# Patient Record
Sex: Female | Born: 1935
Health system: Southern US, Community
[De-identification: ages and names within clinical notes are randomized; demographics above are authoritative.]

## PROBLEM LIST (undated history)

## (undated) DIAGNOSIS — R2681 Unsteadiness on feet: Secondary | ICD-10-CM

## (undated) DIAGNOSIS — I340 Nonrheumatic mitral (valve) insufficiency: Secondary | ICD-10-CM

## (undated) DIAGNOSIS — R41841 Cognitive communication deficit: Secondary | ICD-10-CM

## (undated) DIAGNOSIS — F0280 Dementia in other diseases classified elsewhere without behavioral disturbance: Secondary | ICD-10-CM

## (undated) DIAGNOSIS — I10 Essential (primary) hypertension: Secondary | ICD-10-CM

## (undated) DIAGNOSIS — R4701 Aphasia: Secondary | ICD-10-CM

## (undated) DIAGNOSIS — T7840XA Allergy, unspecified, initial encounter: Secondary | ICD-10-CM

## (undated) DIAGNOSIS — K219 Gastro-esophageal reflux disease without esophagitis: Secondary | ICD-10-CM

## (undated) DIAGNOSIS — D126 Benign neoplasm of colon, unspecified: Secondary | ICD-10-CM

## (undated) DIAGNOSIS — F329 Major depressive disorder, single episode, unspecified: Secondary | ICD-10-CM

## (undated) DIAGNOSIS — F419 Anxiety disorder, unspecified: Secondary | ICD-10-CM

## (undated) DIAGNOSIS — C159 Malignant neoplasm of esophagus, unspecified: Secondary | ICD-10-CM

## (undated) DIAGNOSIS — R261 Paralytic gait: Secondary | ICD-10-CM

## (undated) DIAGNOSIS — K5790 Diverticulosis of intestine, part unspecified, without perforation or abscess without bleeding: Secondary | ICD-10-CM

## (undated) DIAGNOSIS — F0390 Unspecified dementia without behavioral disturbance: Secondary | ICD-10-CM

## (undated) DIAGNOSIS — E785 Hyperlipidemia, unspecified: Secondary | ICD-10-CM

## (undated) DIAGNOSIS — J449 Chronic obstructive pulmonary disease, unspecified: Secondary | ICD-10-CM

## (undated) DIAGNOSIS — M810 Age-related osteoporosis without current pathological fracture: Secondary | ICD-10-CM

## (undated) DIAGNOSIS — R1310 Dysphagia, unspecified: Secondary | ICD-10-CM

## (undated) DIAGNOSIS — R232 Flushing: Secondary | ICD-10-CM

## (undated) HISTORY — DX: Essential (primary) hypertension: I10

## (undated) HISTORY — DX: Hyperlipidemia, unspecified: E78.5

## (undated) HISTORY — PX: ABDOMINAL HYSTERECTOMY: SHX81

## (undated) HISTORY — PX: ANTERIOR AND POSTERIOR VAGINAL REPAIR W/ SACROSPINOUS LIGAMENT SUSPENSION: SUR6

## (undated) HISTORY — DX: Benign neoplasm of colon, unspecified: D12.6

## (undated) HISTORY — PX: TONSILLECTOMY: SUR1361

## (undated) HISTORY — DX: Diverticulosis of intestine, part unspecified, without perforation or abscess without bleeding: K57.90

## (undated) HISTORY — PX: TUBAL LIGATION: SHX77

## (undated) HISTORY — DX: Anxiety disorder, unspecified: F41.9

## (undated) HISTORY — PX: OTHER SURGICAL HISTORY: SHX169

---

## 1983-06-13 HISTORY — PX: EXPLORATORY LAPAROTOMY: SUR591

## 1993-12-10 DIAGNOSIS — D126 Benign neoplasm of colon, unspecified: Secondary | ICD-10-CM

## 1993-12-10 HISTORY — DX: Benign neoplasm of colon, unspecified: D12.6

## 2000-04-20 ENCOUNTER — Encounter (INDEPENDENT_AMBULATORY_CARE_PROVIDER_SITE_OTHER): Payer: Self-pay | Admitting: Specialist

## 2000-04-20 ENCOUNTER — Other Ambulatory Visit: Admission: RE | Admit: 2000-04-20 | Discharge: 2000-04-20 | Payer: Self-pay | Admitting: Gastroenterology

## 2000-11-05 ENCOUNTER — Ambulatory Visit (HOSPITAL_COMMUNITY): Admission: RE | Admit: 2000-11-05 | Discharge: 2000-11-05 | Payer: Self-pay | Admitting: Pulmonary Disease

## 2000-12-17 ENCOUNTER — Encounter: Payer: Self-pay | Admitting: Gynecology

## 2000-12-20 ENCOUNTER — Inpatient Hospital Stay (HOSPITAL_COMMUNITY): Admission: RE | Admit: 2000-12-20 | Discharge: 2000-12-21 | Payer: Self-pay | Admitting: Gynecology

## 2001-07-23 ENCOUNTER — Inpatient Hospital Stay (HOSPITAL_COMMUNITY): Admission: RE | Admit: 2001-07-23 | Discharge: 2001-07-24 | Payer: Self-pay | Admitting: Gynecology

## 2001-11-06 ENCOUNTER — Ambulatory Visit (HOSPITAL_COMMUNITY): Admission: RE | Admit: 2001-11-06 | Discharge: 2001-11-06 | Payer: Self-pay | Admitting: Pulmonary Disease

## 2002-11-07 ENCOUNTER — Ambulatory Visit (HOSPITAL_COMMUNITY): Admission: RE | Admit: 2002-11-07 | Discharge: 2002-11-07 | Payer: Self-pay | Admitting: Pulmonary Disease

## 2002-11-13 ENCOUNTER — Ambulatory Visit (HOSPITAL_COMMUNITY): Admission: RE | Admit: 2002-11-13 | Discharge: 2002-11-13 | Payer: Self-pay | Admitting: Pulmonary Disease

## 2003-11-19 ENCOUNTER — Ambulatory Visit (HOSPITAL_COMMUNITY): Admission: RE | Admit: 2003-11-19 | Discharge: 2003-11-19 | Payer: Self-pay | Admitting: Pulmonary Disease

## 2004-01-05 ENCOUNTER — Other Ambulatory Visit: Admission: RE | Admit: 2004-01-05 | Discharge: 2004-01-05 | Payer: Self-pay | Admitting: Gynecology

## 2004-11-22 ENCOUNTER — Ambulatory Visit (HOSPITAL_COMMUNITY): Admission: RE | Admit: 2004-11-22 | Discharge: 2004-11-22 | Payer: Self-pay | Admitting: Gynecology

## 2005-11-29 ENCOUNTER — Ambulatory Visit (HOSPITAL_COMMUNITY): Admission: RE | Admit: 2005-11-29 | Discharge: 2005-11-29 | Payer: Self-pay | Admitting: Gynecology

## 2006-03-07 ENCOUNTER — Other Ambulatory Visit: Admission: RE | Admit: 2006-03-07 | Discharge: 2006-03-07 | Payer: Self-pay | Admitting: Gynecology

## 2006-06-12 HISTORY — PX: COLONOSCOPY: SHX174

## 2006-12-03 ENCOUNTER — Ambulatory Visit (HOSPITAL_COMMUNITY): Admission: RE | Admit: 2006-12-03 | Discharge: 2006-12-03 | Payer: Self-pay | Admitting: Internal Medicine

## 2007-02-08 ENCOUNTER — Ambulatory Visit: Payer: Self-pay | Admitting: Gastroenterology

## 2007-02-22 ENCOUNTER — Ambulatory Visit: Payer: Self-pay | Admitting: Gastroenterology

## 2007-12-05 ENCOUNTER — Ambulatory Visit (HOSPITAL_COMMUNITY): Admission: RE | Admit: 2007-12-05 | Discharge: 2007-12-05 | Payer: Self-pay | Admitting: Gynecology

## 2008-01-27 ENCOUNTER — Ambulatory Visit: Payer: Self-pay | Admitting: Cardiovascular Disease

## 2008-01-27 ENCOUNTER — Inpatient Hospital Stay (HOSPITAL_COMMUNITY): Admission: RE | Admit: 2008-01-27 | Discharge: 2008-01-30 | Payer: Self-pay | Admitting: Orthopedic Surgery

## 2008-01-29 ENCOUNTER — Encounter (INDEPENDENT_AMBULATORY_CARE_PROVIDER_SITE_OTHER): Payer: Self-pay | Admitting: Orthopedic Surgery

## 2008-01-29 ENCOUNTER — Ambulatory Visit: Payer: Self-pay | Admitting: Vascular Surgery

## 2008-05-13 HISTORY — PX: OTHER SURGICAL HISTORY: SHX169

## 2008-06-01 ENCOUNTER — Ambulatory Visit (HOSPITAL_BASED_OUTPATIENT_CLINIC_OR_DEPARTMENT_OTHER): Admission: RE | Admit: 2008-06-01 | Discharge: 2008-06-01 | Payer: Self-pay | Admitting: Orthopedic Surgery

## 2010-09-06 ENCOUNTER — Other Ambulatory Visit: Payer: Self-pay | Admitting: Gynecology

## 2010-10-25 NOTE — Op Note (Signed)
NAMEJADAN, Jamie [name]             ACCOUNT NO.:  [id number]   MEDICAL RECORD NO.:  [id number]          PATIENT TYPE:  INP   LOCATION:  5025                         FACILITY:  [facility]   PHYSICIAN:  Robert A. Thurston Hole, M.D. DATE OF BIRTH:  1929/02/24   DATE OF PROCEDURE:  01/28/2008  DATE OF DISCHARGE:                               OPERATIVE REPORT   PREOPERATIVE DIAGNOSIS:  Left elbow olecranon fracture.   POSTOPERATIVE DIAGNOSIS:  Left elbow olecranon fracture.   PROCEDURE:  Open reduction and internal fixation of left elbow olecranon  fracture.   SURGEON:  Elana Alm. Thurston Hole, MD   ASSISTANT:  Julien Girt, PA   ANESTHESIA:  General.   OPERATIVE TIME:  1 hour.   COMPLICATIONS:  None.   DESCRIPTION OF PROCEDURE:  [name] was brought to the operating room  on January 28, 2008, after an axillary block was placed in holding by  anesthesia for postoperative pain control.  She was placed on the  operating table in supine position.  She received Ancef 1 g IV  preoperatively for prophylaxis.  After being placed under general  anesthesia, her left arm was prepped using sterile DuraPrep and draped  using sterile technique.  Arm was exsanguinated and a truncate elevated  to 150 mmHg.  Initially, 8 cm longitudinal incision based over the  olecranon and an initial exposure was made.  Underlying subcutaneous  tissues were incised along with skin incision.  The periosteum incised  over the fracture.  The fracture was easily identified and found to be a  large olecranon fragment.  Hematoma was cleaned from the fracture site  and thoroughly irrigated.  The articular surfaces were found to be  intact except for at the fracture site.  At this point, then a towel  clip was used to reduce the fracture and then a guide pin from the  cannulated 7.2-mm screw set was drilled from the tip of the olecranon  across the fracture site and into the ulna shaft under fluoroscopic  control.  This held  the fracture reduced and then it was measured, found  to be 90 mm in length and then overdrilled with a 5.0-mm drill and then  a 90 mm x 7.2 mm screw was screwed over the guide pin.  Prior to  tightening this, however, a figure-of-eight 18-gauge wire was placed  making 2 small drill holes in the proximal ulna approximately 3 cm  distal to the fracture site and then the figure-of-eight wire was passed  around the tip of the screw and then tightened down and then the screw  was tightened down as well.  After this was done, then intraoperative  fluoroscopy confirmed anatomic reduction of the fracture in satisfactory  position of the hardware.  The extra wire was then cut and the wire was  buried in the lateral soft tissue away from the ulnar nerve.  The elbow,  we brought the range of motion from 0-90 degrees with no displacement of  the fracture.  The wound was thoroughly irrigated.  It was felt at this  point that the all the pathology been  satisfactorily addressed.  The  periosteum and fascia was enclosed over the hardware and the fracture  site with interrupted 2-0 Vicryl suture, subcutaneous  tissue is closed with 2-0 Vicryl, skin closed with skin staples.  Sterile dressings were applied and the long-arm splint applied with the  elbow in 45 degrees of flexion.  Tourniquet was then released.  She was  then awakened, extubated, and taken to recovery room in stable  condition.  Needle and sponge counts were correct x2 at the end of the  case.      Robert A. Thurston Hole, M.D.  Electronically Signed     RAW/MEDQ  D:  01/28/2008  T:  01/29/2008  Job:  [id number]

## 2010-10-25 NOTE — Op Note (Signed)
[name], [name]             ACCOUNT NO.:  [id number]   MEDICAL RECORD NO.:  [id number]          PATIENT TYPE:  AMB   LOCATION:  DSC                          FACILITY:  [facility]   PHYSICIAN:  Robert A. Thurston Hole, M.D. DATE OF BIRTH:  [birth year]   DATE OF PROCEDURE:  06/01/2008  DATE OF DISCHARGE:                               OPERATIVE REPORT   PREOPERATIVE DIAGNOSIS:  Left elbow arthrofibrosis status post open  reduction and internal fixation olecranon fracture.   POSTOPERATIVE DIAGNOSIS:  Left elbow arthrofibrosis status post open  reduction and internal fixation olecranon fracture.   PROCEDURE:  1. His left elbow examination under anesthesia followed by      manipulation under anesthesia.  2. Left elbow intraarticular cortisone injection.   SURGEON:  Elana Alm. Thurston Hole, M.D.   ANESTHESIA:  General.   OPERATIVE TIME:  10 minutes.   COMPLICATIONS:  None.   INDICATIONS FOR PROCEDURE:  [name] is a 77 year old woman who is 4  months status post left elbow ORIF of her olecranon fracture.  The  fracture has healed well, but she has had significant post fracture  arthrofibrosis with failed conservative care and is now to undergo EUA  manipulation and injection.   DESCRIPTION:  [name] was brought to the operating room on June 01, 2008, placed on operative table on supine position.  After adequate  level of general anesthesia was obtained, her left elbow was examined.  Initial range of motion was from -20 to 70 degrees.  This elbow was  sterilely injected with 0.25% Marcaine with epinephrine and 40 mg of  Depo-Medrol.  A gentle manipulation was carried down and flexion  breaking up adhesions and improving flexion of 130 degrees, extension to  -15, and the elbow remains stable.  Intraoperative fluoroscopy confirmed  persistent anatomic position of the fracture with the fracture being  healed as well as anatomic position of the elbow joint, no abnormalities  noted.  At  this point, she awakened and taken to the recovery room in  stable condition.   FOLLOWUP CARE:  [name] will be followed as an outpatient on Darvocet  for pian with early aggressive physical therapy.  She will be seen back  in the office in a week for recheck and followup.     Robert A. Thurston Hole, M.D.  Electronically Signed    RAW/MEDQ  D:  06/01/2008  T:  06/02/2008  Job:  [id number]

## 2010-10-25 NOTE — Consult Note (Signed)
Jamie [name], Jamie [name]             ACCOUNT NO.:  [id number]   MEDICAL RECORD NO.:  [id number]          PATIENT TYPE:  INP   LOCATION:  5025                         FACILITY:  [facility]   PHYSICIAN:  Noralyn Pick. Eden Emms, MD, FACCDATE OF BIRTH:  [birth year]   DATE OF CONSULTATION:  DATE OF DISCHARGE:                                 CONSULTATION   Jamie [name] is a 77 year old patient admitted to the hospital yesterday  after a fall.  She had had her eyes dilated to have a ophthalmologic  exam.  She tripped on a curve.  She injured her right wrist, left ankle,  and left elbow.  She is preop for left elbow surgery.  In fact when I  saw the patient, she was already in the PACU.  We were asked to evaluate  her due to an abnormal EKG.  The patient is totally asymptomatic in  regards to her heart.  She has never had palpitations, PND, or  orthopnea.  She has mild exertional dyspnea.  She is a smoker and  apparently has some COPD.  The patient's EKG was reviewed.  She has  nonsignificant ST-T wave changes.  There is no evidence of a previous  infarct and no acute changes that I can tell.   The patient does have a history of right carotid bruit.  She tells me  she had a Duplex less than 2 years ago and did not have a bad stenosis.  It has not been followed up.   PAST MEDICAL HISTORY:  Otherwise, remarkable for colonic polyps,  hypertension, anxiety, hypercholesterolemia, and question mitral valve  prolapse.   ALLERGIES:  She is allergic to CODEINE.   CURRENT MEDICATIONS:  She is on aspirin, Xanax, Zetia 10 a day,  cholecalciferol, Celexa, Lisinopril 10 a day, estradiol, morphine, and  Robaxin.   SOCIAL HISTORY:  She lives in [place].  She is widowed.  She does  activities of daily living.  She is very active.  She does not drink.  She has a 50-pack-year smoking history.   FAMILY HISTORY:  Remarkable for mother dying of CVA.  Father having an  MI.   REVIEW OF SYSTEMS:  Otherwise, negative.   PHYSICAL EXAMINATION:  GENERAL:  Remarkable for a thin white female in  no distress, although she has a splint and Ace bandages around her right  wrist, left elbow, and left ankle.  VITAL SIGNS:  Blood pressure is 130/60, pulse 61 and regular,  temperature 98.4, and sats are 95% on room air.  HEENT:  Unremarkable.  NECK:  She does have a right carotid bruit.  No lymphadenopathy,  thyromegaly, or JVP elevation.  LUNGS:  Clear.  Good diaphragmatic motion.  No wheezing.  HEART:  S1 and S2 with a soft systolic murmur.  PMI normal.  ABDOMEN:  Benign.  Bowel sounds positive.  No AAA.  No tenderness.  No  bruits.  No hepatosplenomegaly.  No hepatojugular reflux.  EXTREMITIES:  Distal pulses are intact.  No edema.  NEUROLOGIC:  Nonfocal.  SKIN:  Warm and dry.  MUSCULOSKELETAL:  No muscular weakness.   EKG was  as indicated with sinus rhythm, nonsignificant ST-T wave  changes.   LABORATORY DATA:  Lab work is remarkable for hematocrit of 36.8 and  creatinine is 0.7.  Point-of-care enzymes are negative.   IMPRESSION:  1. The patient is not having chest pain.  She has no history of      coronary artery disease.  She is undergoing relatively low risk      surgery.  She is clear to have her surgery.  2. Right carotid bruit.  She needs a follow up duplex.  We would      maintain on aspirin therapy.  3. Systolic murmur, very well could have some mitral valve prolapse.      There is no cardiac enlargement on her chest x-ray.  She has not      had heart failure.  Follow up echo is in order.  4. Smoking.  The patient seems to be motivated to quit at this point.      She should probably be discharged on Wellbutrin.   I explained the fact that the surgery carries some risk in regards to  her general anesthesia and her previous smoking.  However, I do not  think it needs to be delayed in regards to any higher risk for ischemia  or coronary artery disease.      Noralyn Pick. Eden Emms, MD, [facility]   Electronically Signed     PCN/MEDQ  D:  01/28/2008  T:  01/29/2008  Job:  [id number]

## 2010-10-28 NOTE — Discharge Summary (Signed)
[facility]  Patient:    Jamie [name], Jamie [name] Visit Number: [id number] MRN: [id number]          Service Type: Attending:  Gretta Cool, M.D. Dictated by:   Jeani Sow, F.N.P. Adm. Date:  07/23/01 Disc. Date: 07/24/01                             Discharge Summary  HISTORY OF PRESENT ILLNESS:  Jamie [name] is a 77 year old gravida 3, para 3, with a history of total vaginal vault prolapse, cystocele, enterocele, rectocele, which were repaired in July 2002.  She had almost immediate failure with the anterior enterocele development and recurrent pelvic organ prolapse which was progressive.  She developed a weak area in the anterior vaginal wall near the cuff closure that is felt to be an anterior enterocele.  She is now admitted for repeat management.  ADMISSION EXAM:  CHEST:  Clear to A&P.  HEART:  Rate and rhythm were regular without murmur, gallop, or cardiac enlargement.  ABDOMEN:  Soft and scaphoid without masses or organomegaly.  PELVIC:  External genitalia within normal limits for female vagina.  There is an anterior enterocele that bulges and protrudes from the anterior to the vaginal cuff closure.  The cuff is reasonably well supported.  Posterior support is adequate.  Retrovaginal exam confirms.  IMPRESSION: 1. Recurrent pelvic organ prolapse secondary to anterior enterocele. 2. Urgency incontinence that is persistent.  PLAN:  All options have been discussed with the patient.  She has previously had partial colpocleisis.  Recommendation now is complete colpocleisis with repair of the anterior vaginal wall enterocele under general anesthesia. Risks and benefits have been discussed with the patient and she accepts these procedures.  Admission hemoglobin 14.6, hematocrit 43.2.  On the first postoperative day, hemoglobin was 11.8, hematocrit 34.5.  HOSPITAL COURSE:  Patient underwent repair of the pelvic organ prolapse under general anesthesia.   The procedure was completed without any complications and the patient was returned to the recovery room in excellent condition.  Her postoperative course was without complication and she was discharged on the first postoperative day in excellent condition.  Final discharge instructions included no heavy lifting or straining, no vaginal entrance, and increase ambulation as tolerated.  She is to call for any fever of over 100.5 or failure of daily improvement.  Diet regular.  MEDICATIONS: 1. Citrucel two times a day. 2. Tylox one p.o. q.4h. p.r.n. pain. 3. Vioxx 25 mg daily.  FOLLOW-UP:  She is to return to the office in one week for follow-up.  CONDITION ON DISCHARGE:  Excellent.  FINAL DISCHARGE DIAGNOSIS:  Anterior enterocele with recurrent pelvic prolapse.  PROCEDURES PERFORMED: 1. Repair of pelvic organ prolapse with anterior enterocele. 2. General anesthesia. Dictated by:   Jeani Sow, F.N.P. Attending:  Gretta Cool, M.D. DD:  08/19/01 TD:  08/20/01 Job: [id number]

## 2010-10-28 NOTE — H&P (Signed)
[facility]  Patient:    Jamie [name], Jamie [name] Visit Number: [id number] MRN: [id number]          Service Type: GYN Location: 1S X002 01 Attending Physician:  Katrina Stack Dictated by:   Gretta Cool, M.D. Admit Date:  07/23/2001                           History and Physical  PREOPERATIVE DIAGNOSIS:  Anterior enterocele with recurrent pelvic prolapse.  POSTOPERATIVE DIAGNOSIS:  Anterior enterocele with recurrent pelvic prolapse.  PROCEDURES:  Repair of pelvic organ prolapse with anterior enterocele.  SURGEON:  Gretta Cool, M.D.  ASSISTANT:  Raynald Kemp, M.D.  ANESTHESIA:  General orotracheal.  DESCRIPTION OF PROCEDURE:  Under excellent general anesthesia with the patients abdomen prepped and draped in the lithotomy position in [place] stirrups with a Foley catheter draining her bladder, an incision was made at the apex of the vaginal cuff.  The patient had previously had a partial colpocleisis.  She had a very large anterior enterocele bulging from the apex of the vaginal cuff just anterior to the cuff closure.  At this point, the anterior vaginal wall mucosa was incised centrally and the mucosa dissected from the endocervical fascia.  Once the fascia was adequately freed so as to release the bladder and the enterocele, a reduction of the enterocele was undertaken with plication of the enterocele sac and plication of the anterior vaginal wall with a series of pursestring sutures.  Once the bladder was adequately reduced and the anterior vaginal wall was adequately reduced, the adhesions posteriorly could be noted with some tension downward.  Those adhesions were released.  At this point, Kelly plication sutures were placed so as to elevate the vesicle neck.  The apex of the vagina was then fixed with a plication of the anterior and posterior vaginal wall fascia to the ischiorectalis muscle laterally.  One suture was placed on  each side using 0 Ethibond.  At this point, the fascia anteriorly was identified and secured as a complete envelop of fascia to the posterior fascia of the perirectal fascia.  The fascia was approximated with interrupted mattress sutures all the way across the vagina.  Next pursestring sutures of 0 Vicryl were used to plicate concentric rings of fascia so as to completely obliterate the mid portion of the vagina as well.  The fascia was approximated 360 degrees around the perimeter of the vagina.  At this point, the perineal body musculature was reapproximated through a separate posterior repair incision.  Careful attention was given to inspection of the anal sphincter and the terminal rectum.  There was no evidence of weakness or further side for pelvic support failure.  At this point, the mucosa was trimmed and the incisions were closed with a running suture of 2-0 Vicryl as a subcuticular closure, also approximating the upper layers of endopelvic fascia.  At this point, the sponge and lap counts were correct.  There were no complications.  The bladder was filled with irrigation fluid and a [place] suprapubic Cystocath placed. At this point, the procedure was terminated without complications.  The patient returned to the recovery room in excellent condition without complications. Dictated by:   Gretta Cool, M.D. Attending Physician:  Katrina Stack DD:  07/23/01 TD:  07/23/01 Job: [id number]

## 2010-10-28 NOTE — H&P (Signed)
[facility]  Patient:    Jamie [name], Jamie [name]                      MRN: [id number] Attending:  Gretta Cool, M.D. CC:         Dr. Ricki Rodriguez, [place]  Rozanna Boer., M.D.   History and Physical  CHIEF COMPLAINT:  [name] presented initially for gynecologic evaluation for severe pelvic organ prolapse referred by Dr. Aldean Ast and Dr. Juanetta Gosling in [place].  HISTORY OF PRESENT ILLNESS:  A 77 year old white, married, gravida 3, para 3 initially evaluated by Dr. Aldean Ast for cystocele. She also has some significant urgency incontinence but does not have significant stress incontinence. Because of her profound total vaginal vault prolapse and global pelvic support problems, she was referred for a GYN evaluation and consideration of pessary versus definitive surgery. On initial examination, she was noted to have ulceration of the vaginal mucosa from pressure of sitting on the prolapsed part. She initially was fitted with a pessary and tolerated the pessary well. The pessary did not cause significant stress urinary incontinence but she has difficulty tolerating the pessary. Various types of pessary were used but without finding one that was satisfactory for the patient. She is currently using a ______ pessary with center support with reduction of her pelvic organ prolapse but with continued complaints about tolerating it and discomfort with it. She now wishes to proceed to definitive therapy by anterior and posterior colporrhaphy with vault suspension and colpocleisis. She would not consider returning to sexual activity under any circumstance. She has had considerable relief in her urgency with Detrol LA and now wishes to proceed to surgical management rather than continued use of the pessary. She is now admitted for definitive therapy.  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 Medical illnesses, none of  consequence.  PAST SURGICAL HISTORY:  T&A as a child, hysterectomy in 1976 elsewhere, exploratory laparotomy in 1985, salpingo-oophorectomy in 1986 by Dr. Arelia Sneddon.  HABITS:  Denies ethanol. Smokes one pack per day.  CURRENT MEDICATIONS:  Lipitor 10 mg daily, Detrol LA 4 mg daily, Estrace vaginal cream 2-3 times weekly for improvement in vaginal thickness and tolerance of the pessary.  SOCIAL HISTORY:  Retired, gravida 3, para 3 who lives alone with a good family support system.  REVIEW OF SYSTEMS:  HEENT:  Denies symptoms. CARDIORESPIRATORY:  Denies asthma, cough, bronchitis, shortness of breath. GI/GU:  Denies frequency or urgency, dysuria, or change in bowel habits, food intolerance. Urgency incontinence as above up to 3-4 times at night.  PHYSICAL EXAMINATION:  GENERAL:  Well-developed, well-nourished, thin, white female.  HEENT:  Pupils equal and reactive to light and accommodate. Fundi not examined. Oropharynx clear.  NECK:  Supple without mass or thyroid enlargement.  CHEST:  Clear to auscultation and percussion.  BREASTS:  Soft without mass, nodes, or nipple discharge.  HEART:  Regular rhythm without murmur or cardiac enlargement.  ABDOMEN:  Soft, scaphoid without mass or organomegaly. Previous incision scar is noted.  PELVIC:  External genitalia normal female. Vagina clean and rugose. With the pessary removed, there is complete total vaginal vault prolapse even at rest extending approximately 6 cm beyond the introitus. There is enterocele, rectocele and cystocele all protruding with the apex of the vagina. She has reasonably well preserved anal sphincter tone and intubation of her perineum. Rectovaginal exam confirms cervix and uterus surgically absent and adnexa also absent.  EXTREMITIES:  Negative.  NEUROLOGIC:  Physiologic.  IMPRESSIONS:  1. Severe vaginal vault prolapse, grade 4 with cystocele, rectocele,     and enterocele all grade 4 with intolerance of  pessary.  2. Urge incontinence without significant stress component with negative     barrier test.  PLAN:  Vaginal vault suspension procedure, anterior posterior colporrhaphy and enterocele repair and colpocleisis. Note, I have discussed the alternatives, risk/benefit ratio with the patient in detail. She understands the greater risk of medical complications as a result of her smoking. She is now admitted for definitive therapy as above. DD:  12/19/00 TD:  12/20/00 Job: [id number]

## 2010-10-28 NOTE — Discharge Summary (Signed)
[facility]  Patient:    Jamie [name], Jamie [name]                      MRN: [id number] Adm. Date:  12/20/00 Disc. Date: 12/21/00 Attending:  Gretta Cool, M.D. Dictator:   Jeani Sow, F.N.P. CC:         Rozanna Boer., M.D.   Discharge Summary  HISTORY OF PRESENT ILLNESS:  Ms. Bilski is a 77 year old white married female, gravida 3, para 3, who was initially seen by Dr. Aldean Ast and referred to our office for evaluation of severe pelvic organ prolapse.  She reports significant urgency incontinence but does not seem to have stress incontinence.  Because of her total vaginal vault prolapse and global pelvic support problems, she was referred for consideration of pessary versus definitive therapy.  On initial exam, it was noted that she had ulceration of the vaginal mucosa from pressure of sitting on the prolapsed part.  She was initially fitted with a pessary and tolerated it well.  It did not cause significant stress incontinence, but she did have difficulty tolerating the pessary.  Various types were attempted without finding one that was satisfactory for the patient.  She now wishes to proceed to definitive therapy by anterior and posterior colporrhaphy with vaginal vault suspension and colpocleisis.  She also had considerable relief of the urgency with Detrol LA.  PHYSICAL EXAMINATION:  CHEST:  Clear to A&P.  HEART:  Rate and rhythm were regular without murmur, gallop, or cardiac enlargement.  ABDOMEN:  Soft and scaphoid without masses or organomegaly.  A previous incision scar is noted.  PELVIC:  External genitalia within normal limits for female.  Vagina is clean and rugose.  With the pessary removed, there is complete total vaginal vault prolapse and, even at rest, extending approximately 6 cm beyond the introitus. There is an enterocele, rectocele, and a cystocele all protruding with the apex of the vagina.  She has reasonably well  preserved anal sphincter tone. Rectovaginal exam confirms.  The uterus and cervix were surgically absent and adnexa are also absent.  IMPRESSION: 1. Severe vaginal vault prolapse, grade 4, with a cystocele, rectocele, and    enterocele, with intolerance of pessary. 2. Urge incontinence without significant stress component with negative    barrier test.  PLAN:  Vaginal vault suspension procedure, anterior and posterior colporrhaphy and enterocele repairs, and colpocleisis under general anesthesia.  Risks and benefits have been discussed with the patient and she accepts these procedures.  LABORATORY DATA:  Admission hemoglobin 14.4, hematocrit 42.6.  The remainder of her preoperative laboratory work was within normal limits.  EKG normal sinus rhythm, nonspecific T wave abnormality.  Chest x-ray negative for active disease.  Pulmonary vascularity is felt to be within normal limits.  There is also a minimal increased density noted in the left cardiophrenic area which may represent an apical fat pad.  HOSPITAL COURSE:  Patient underwent the above-named procedures under general anesthesia.  The procedure was completed without any complications and the patient was returned to the recovery room in excellent condition.  Her postoperative course was without complications and she was discharged on the first postoperative day in excellent condition.  Final discharge instructions included no heavy lifting or straining, no vaginal entrance, and increase ambulation as tolerated.  She is to call for any fever of over 100.5 or failure of daily improvement.  She had difficulty voiding that first day after surgery.  An in and out  catheterization was 500 cc; therefore, the Foley catheter was inserted for discharge.  She is to return to the office in four days for follow-up.  DIET:  Regular.  MEDICATIONS: 1. Vioxx 25 mg daily. 2. Tylox one p.o. q.2h. p.r.n. discomfort. 3. Ceftin 500 mg b.i.d. for  four days.  POSTOPERATIVE DIAGNOSES: 1. Vaginal vault prolapse, grade 4, with cystocele, rectocele, enterocele. 2. Stress urinary incontinence, genuine stress component. 3. Previous hysterectomy elsewhere.  PROCEDURES PERFORMED:  Anterior and posterior colporrhaphy and vaginal vault suspension and partial colpocleisis under general anesthesia. DD:  01/02/01 TD:  01/02/01 Job: [id number]

## 2010-10-28 NOTE — Op Note (Signed)
[facility]  Patient:    Jamie [name], Jamie [name]              MRN: [id number] Adm. Date:  [id number] Attending:  Katrina Stack CC:         Rozanna Boer., M.D.   Operative Report  PREOPERATIVE DIAGNOSES: 1. Vaginal vault prolapse, grade 4, with cystocele, enterocele, rectocele,    also grade 4. 2. Stress urinary incontinence -- genuine stress incontinence. 3. Previous hysterectomy elsewhere.  POSTOPERATIVE DIAGNOSES: 1. Vaginal vault prolapse, grade 4, with cystocele, enterocele, rectocele,    also grade 4. 2. Stress urinary incontinence -- genuine stress incontinence. 3. Previous hysterectomy elsewhere.  PROCEDURE:  Anterior and posterior colporrhaphy, vaginal vault suspension, partial colpocleisis.  SURGEON:  Gretta Cool, M.D.  ASSISTANT:  Raynald Kemp, M.D.  ANESTHESIA:  General orotracheal.  DESCRIPTION OF PROCEDURE:  Under excellent general anesthesia, with the patient prepped and draped in the lithotomy position, with a Foley catheter draining her bladder, the apex of the vaginal vault was grasped with Allis clamps and delivered through the introitus, approximately 10 cm beyond the introitus.  The mucosa approximately mid-length of the anterior vaginal wall was incised and dissected from the endopelvic fascia.  The mucosa was then peeled all the way to the previous vaginal vault closure.  The adhesions of the vault were excised and the dissection continued posteriorly.  With dissection of the posterior vaginal wall, a very large enterocele was encountered.  Once the mucosa was dissected from the perirectal fascia, the mucosa was trimmed and the enterocele identified.  The enterocele was then opened and excised as high as possible.  Pursestring sutures were then placed to close the enterocele completely.  Once the enterocele was plicated, the very large cystocele that had been exposed was plicated initially  with pursestring sutures, then with simple sutures so as to approximate the endopelvic fascia and push the bladder back into high intra-abdominal position.  The bladder pillars and apex of the vaginal support were identified.  The bladder pillars were then plicated in the midline.  At this point, attention was turned to the posterior vaginal wall.  The uterosacral remnants were identified.  The uterosacral-cardinal complex was then grasped as far posteriorly as possible and then secured with 0 Ethibond suture to the lateral vaginal wall at the angle of the vagina where the dimple of the previous uterosacral attachment could be identified.  The suture was then secured to anterior and posterior walls of the full thickness of vagina and to the ischiocavernosus muscle laterally so as to secure the vaginal cuff on each side.  At this point, the fascia adherent to the uterosacral-cardinal complex was plicated in the midline with interrupted sutures of 0 Vicryl.  A pursestring suture was then placed circumferentially around from the posterior fascia plication to lateral walls and to the anterior fascia and bladder pillars.  A series of three pursestring sutures were then placed approximately a centimeter apart so as to close the vagina entirely and create a complete envelop of endopelvic fascia.  At this point, the rectocele repair was undertaken.  The posterior vaginal wall mucosa was incised and then dissected from the perirectal fascia.  The fascia was then plicated in the midline so as to close the rectocele defect.  The levator fascia was then plicated closely so as to secure the levator ani muscle group and its covering fascia in the midline, so as to close the large posterior vaginal wall defect  and the diastasis of the levator group.  The detached perineal body was repaired with the same suture.  The mucosa was then trimmed and the cuff closed with a subcuticular closure including the  mucosa and the upper layers of endopelvic fascia.  At the end of the procedure, the vaginal length remained approximately 4 or 5 cm.  The bladder was well-reduced and the apex of the vagina very well-secured.  At the end of the procedure, sponge and lap counts were correct, no complications and patient returned to the recovery room in excellent condition. DD:  12/20/00 TD:  12/20/00 Job: [id number]

## 2010-10-28 NOTE — Discharge Summary (Signed)
NAMEJOELLY, BOLANOS             ACCOUNT NO.:  [id number]   MEDICAL RECORD NO.:  [id number]          PATIENT TYPE:  INP   LOCATION:  5025                         FACILITY:  [facility]   PHYSICIAN:  Robert A. Thurston Hole, M.D. DATE OF BIRTH:  July 07, 1928   DATE OF ADMISSION:  01/27/2008  DATE OF DISCHARGE:  01/30/2008                               DISCHARGE SUMMARY   ADMITTING DIAGNOSES:  1. Left elbow olecranon fracture.  2. Left ankle cuboid avulsion fracture.  3. Right wrist pisiform fracture.  4. Chronic obstructive pulmonary disease.  5. Hypertension.  6. Anxiety.  7. Mitral valve insufficiency.  8. Colon polyps.   DISCHARGE DIAGNOSES:  1. Left elbow olecranon fracture.  2. Right wrist pisiform fracture.  3. Left ankle cuboid avulsion fracture.  4. Chronic obstructive pulmonary disease.  5. Hypertension.  6. Anxiety.  7. Mitral valve insufficiency.  8. Colon polyps.   HISTORY OF PRESENT ILLNESS:  The patient is a 77 year old female who  stepped off a curb and fell landing, missed a step coming out of her  doctor's office today.  She fell injuring her left elbow, left wrist,  right wrist, and left ankle.  She had no loss of consciousness, no head  trauma, no shortness of breath, or chest pain.  She was taken by  personal car to [facility] ER where all these fractures were noted and she  was a direct admit to [facility].   PROCEDURES IN-HOUSE:  On January 28, 2000, the patient underwent ORIF of  the left elbow olecranon fracture.  She tolerated the procedure well.   HOSPITAL COURSE:  Postoperatively, her cardiac enzymes were negative.  Her hemoglobin was 10.5.  She was groggy, but alert, oriented, and  easily arousable.  Her left arm was in a splint, right arm was in a  splint, and left foot was in a splint.  A CAM walker was ordered.  Physical therapy was ordered.  She is weightbearing as tolerated on her  lower extremities.  A 2-D echo was performed and she felt she was  stable  from a cardiac standpoint.  Postop day #2, the patient was doing well  with transfers.  Hemoglobin was 10.  She was neurovascularly intact with  all of her splints in place.  She was discharged to home in stable  condition, weightbearing as tolerated after she had a bowel movement  with the assistance of a Dulcolax suppository.  Consults on the case  were Dr. Theron Arista admission for preoperative cardiac consult, Physical  Therapy, Occupational Therapy, and Case Management.  She went home with  Home Health, PT, OT, and nursing.  She went home with her daughter.  She  will follow up with Dr. Thurston Hole in 1 week.   DISCHARGE MEDICATIONS:  1. Aspirin 81 mg daily.  2. Xanax 0.5 mg half a tablet q.a.m.  3. Zetia 10 mg daily.  4. Citalopram 1 tablet daily.  5. Lisinopril 10 mg 1 tablet daily.  6. Vivelle-Dot tablet Saturday and Wednesday.  7. Norco 5/325 one to two q.4-6 h. p.r.n. pain.  8. Robaxin 500 mg one q.6 h.  p.r.n. spasm.  9. Colace 100 mg 1 tablet twice a day.  10.Senokot-S 2 tablets with dinner if no BM in 2 days.      Kirstin Shepperson, P.A.      Robert A. Thurston Hole, M.D.  Electronically Signed    KS/MEDQ  D:  04/13/2008  T:  04/13/2008  Job:  [id number]

## 2010-10-28 NOTE — H&P (Signed)
[facility]  Patient:    Jamie [name], Jamie [name] Visit Number: [id number] MRN: [id number]          Service Type: Attending:  Gretta Cool, M.D. Dictated by:   Gretta Cool, M.D.                           History and Physical  CHIEF COMPLAINT:  Recurrent pelvic organ prolapse.  HISTORY OF PRESENT ILLNESS:  A 77 year old gravida 3, para 3, with a history of total vaginal vault prolapse, cystocele, enterocele, rectocele, repaired July 2002.  She had almost immediate failure with anterior enterocele development and recurrent pelvic organ prolapse that was progressive.  Her initial treatment was anterior and posterior colporrhaphy enterocele repair, vaginal vault suspension, and partial colpocleisis.  She developed a weak area in the anterior vaginal wall near the cuff closure that is felt to be an anterior enterocele.  She is now admitted for repeat management.  PAST MEDICAL HISTORY:  Significant in that the patient has dyslipidemia.  She also has urgency-type incontinence.  No significant stress.  She uses Detrol for that.  She also has used esterase vaginal cream every other day to improve her vaginal vault.  She is now admitted for repair of the anterior enterocele.  PAST MEDICAL HISTORY:  The usual childhood diseases without sequelae.  Medical illnesses:  History of colon polyps under the management of Dr. Corinda Gubler.  No other significant medical illnesses.  FAMILY HISTORY:  Father had MI.  Mother had stroke.  Mother died of lung cancer.  Father had prostate cancer and kidney failure.  HABITS:  The patient smokes one pack daily.  She has had decline in her mental abilities.  She lives along, but is cared for by her daughter.  REVIEW OF SYSTEMS:  HEENT:  Denies symptoms.  Cardiorespiratory:  Denies asthma, cough, bronchitis, shortness of breath.  GI/GU:  Urgency with occasional incontinence, uses Detrol, also Estrace vaginal cream.  Recurrent pelvic  organ prolapse as above.  PHYSICAL EXAMINATION:  GENERAL:  A well-developed, well-nourished white female with darkened skin tones and sallow complexion at 128 pounds and 5 feet 2 inches.  HEENT:  Pupils are equal, round, and reactive to light and accommodate.  Fundi not examined.  Oropharynx clear.  NECK:  Supple without mass or thyroid enlargement.  Carotid pulses are good without bruit.  HEART:  Regular rhythm without murmur or cardiac enlargement.  BREASTS:  Soft without mass, nodes, nipple discharge.  CHEST:  Clear to P&A.  ABDOMEN:  Soft, scaphoid, without mass or organomegaly.  PELVIC:  External genitalia, normal female.  Vagina, there is an anterior enterocele with a bulge that protrudes from the anterior to the vaginal cuff closure.  The vaginal cuff is reasonably well supported.  Posterior support is adequate.  Rectovaginal exam confirms.  EXTREMITIES:  Negative.  NEUROLOGIC:  Physiologic.  IMPRESSIONS: 1. Recurrent pelvic organ prolapse secondary to anterior enterocele. 2. Urgency incontinence, persistent.  PLAN:  I have discussions all.  She has previously had partial colpocleisis. We have recommended, therefore, on to more complete colpocleisis with repair of her anterior vaginal wall enterocele. Dictated by:   Gretta Cool, M.D. Attending:  Gretta Cool, M.D. DD:  07/23/01 TD:  07/23/01 Job: [id number]

## 2011-03-17 LAB — POCT I-STAT, CHEM 8
BUN: 7 mg/dL (ref 6–23)
Calcium, Ion: 1.18 mmol/L (ref 1.12–1.32)
Chloride: 104 mEq/L (ref 96–112)
Potassium: 4.2 mEq/L (ref 3.5–5.1)
Sodium: 140 mEq/L (ref 135–145)

## 2012-01-03 ENCOUNTER — Encounter: Payer: Self-pay | Admitting: Gastroenterology

## 2012-01-08 ENCOUNTER — Telehealth: Payer: Self-pay | Admitting: *Deleted

## 2012-01-08 NOTE — Telephone Encounter (Signed)
I have canceled her recall appts.  FYI Dr. Russella Dar you reviewed her chart.  She has previous history with Dr. Doreatha Martin

## 2012-01-08 NOTE — Telephone Encounter (Signed)
OK 

## 2012-04-15 ENCOUNTER — Ambulatory Visit (INDEPENDENT_AMBULATORY_CARE_PROVIDER_SITE_OTHER): Payer: PRIVATE HEALTH INSURANCE | Admitting: Gastroenterology

## 2012-04-15 ENCOUNTER — Encounter: Payer: Self-pay | Admitting: Gastroenterology

## 2012-04-15 VITALS — BP 140/70 | HR 68 | Ht 61.5 in | Wt 117.0 lb

## 2012-04-15 DIAGNOSIS — R1319 Other dysphagia: Secondary | ICD-10-CM

## 2012-04-15 DIAGNOSIS — Z86010 Personal history of colonic polyps: Secondary | ICD-10-CM

## 2012-04-15 NOTE — Progress Notes (Signed)
History of Present Illness: This is an 78 year old female accompanied by her daughter. The patient and daughter relate a 2-3 year history of intermittent difficulty swallowing larger pills and some solid foods. Her symptoms have progressively worsened over the past few months. She takes Actonel. Denies weight loss, abdominal pain, constipation, diarrhea, change in stool caliber, melena, hematochezia, nausea, vomiting, reflux symptoms, chest pain.  Review of Systems: Pertinent positive and negative review of systems were noted in the above HPI section. All other review of systems were otherwise negative.  Current Medications, Allergies, Past Medical History, Past Surgical History, Family History and Social History were reviewed in [employer] record.  Physical Exam: General: Well developed , well nourished, no acute distress Head: Normocephalic and atraumatic Eyes:  sclerae anicteric, EOMI Ears: Normal auditory acuity Mouth: No deformity or lesions Neck: Supple, no masses or thyromegaly Lungs: Clear throughout to auscultation Heart: Regular rate and rhythm; no murmurs, rubs or bruits Abdomen: Soft, non tender and non distended. No masses, hepatosplenomegaly or hernias noted. Normal Bowel sounds Rectal: Deferred Musculoskeletal: Symmetrical with no gross deformities  Skin: No lesions on visible extremities Pulses:  Normal pulses noted Extremities: No clubbing, cyanosis, edema or deformities noted Neurological: Alert oriented x 4, memory deficits Cervical Nodes:  No significant cervical adenopathy Inguinal Nodes: No significant inguinal adenopathy Psychological:  Alert and cooperative. Normal mood and affect  Assessment and Recommendations:  1. Dysphagia to solids and pills. Schedule barium esophagram. Schedule upper endoscopy with possible dilation. The risks, benefits, and alternatives to endoscopy with possible biopsy and possible dilation were discussed with the  patient and they consent to proceed.   2. Personal history of adenomatous colon polyps. Last colonoscopy 2008 did not show polyps. She has no colorectal complaints. No plans for future screening or surveillance colonoscopies due to age.

## 2012-04-15 NOTE — Patient Instructions (Addendum)
You have been given a separate informational sheet regarding your tobacco use, the importance of quitting and local resources to help you quit.  You have been scheduled for a Barium Esophogram at [facility] (1st floor of the hospital) on 04/16/12 at 10:00. Please arrive 15 minutes prior to your appointment for registration. Make certain not to have anything to eat or drink 6 hours prior to your test. If you need to reschedule for any reason, please contact radiology at [phone] to do so. __________________________________________________________________ A barium swallow is an examination that concentrates on views of the esophagus. This tends to be a double contrast exam (barium and two liquids which, when combined, create a gas to distend the wall of the oesophagus) or single contrast (non-ionic iodine based). The study is usually tailored to your symptoms so a good history is essential. Attention is paid during the study to the form, structure and configuration of the esophagus, looking for functional disorders (such as aspiration, dysphagia, achalasia, motility and reflux) EXAMINATION You may be asked to change into a gown, depending on the type of swallow being performed. A radiologist and radiographer will perform the procedure. The radiologist will advise you of the type of contrast selected for your procedure and direct you during the exam. You will be asked to stand, sit or lie in several different positions and to hold a small amount of fluid in your mouth before being asked to swallow while the imaging is performed .In some instances you may be asked to swallow barium coated marshmallows to assess the motility of a solid food bolus. The exam can be recorded as a digital or video fluoroscopy procedure. POST PROCEDURE It will take 1-2 days for the barium to pass through your system. To facilitate this, it is important, unless otherwise directed, to increase your fluids for the next  24-48hrs and to resume your normal diet.  This test typically takes about 30 minutes to perform. __________________________________________________________________________________  Jamie [name] have been scheduled for an endoscopy with propofol. Please follow written instructions given to you at your visit today. If you use inhalers (even only as needed) or a CPAP machine, please bring them with you on the day of your procedure.  cc: Doreen Beam, MD

## 2012-04-16 ENCOUNTER — Ambulatory Visit (HOSPITAL_COMMUNITY)
Admission: RE | Admit: 2012-04-16 | Discharge: 2012-04-16 | Disposition: A | Discharge status: Home / Self Care | Payer: Medicare Other | Source: Ambulatory Visit | Attending: Gastroenterology | Admitting: Gastroenterology

## 2012-04-16 DIAGNOSIS — R1319 Other dysphagia: Secondary | ICD-10-CM

## 2012-05-15 ENCOUNTER — Ambulatory Visit (AMBULATORY_SURGERY_CENTER): Payer: PRIVATE HEALTH INSURANCE | Admitting: Gastroenterology

## 2012-05-15 ENCOUNTER — Encounter: Payer: Self-pay | Admitting: *Deleted

## 2012-05-15 ENCOUNTER — Encounter: Payer: Self-pay | Admitting: Gastroenterology

## 2012-05-15 VITALS — BP 172/78 | HR 67 | Temp 97.5°F | Resp 20 | Ht 61.0 in | Wt 117.0 lb

## 2012-05-15 DIAGNOSIS — K222 Esophageal obstruction: Secondary | ICD-10-CM

## 2012-05-15 DIAGNOSIS — R933 Abnormal findings on diagnostic imaging of other parts of digestive tract: Secondary | ICD-10-CM

## 2012-05-15 DIAGNOSIS — C159 Malignant neoplasm of esophagus, unspecified: Secondary | ICD-10-CM

## 2012-05-15 DIAGNOSIS — R1319 Other dysphagia: Secondary | ICD-10-CM

## 2012-05-15 HISTORY — PX: OTHER SURGICAL HISTORY: SHX169

## 2012-05-15 HISTORY — DX: Malignant neoplasm of esophagus, unspecified: C15.9

## 2012-05-15 MED ORDER — OMEPRAZOLE 40 MG PO CPDR: 40.0000 mg | DELAYED_RELEASE_CAPSULE | Freq: Every day | ORAL | Status: DC

## 2012-05-15 MED ORDER — SODIUM CHLORIDE 0.9 % IV SOLN: 500.0000 mL | INTRAVENOUS | Status: DC

## 2012-05-15 NOTE — Op Note (Signed)
[facility] [address] [place], [address]   ENDOSCOPY PROCEDURE REPORT  PATIENT: Jamie [name], Jamie [name]  MR#: [id number] BIRTHDATE: 1928/12/31 , 83  yrs. old GENDER: Female ENDOSCOPIST: Meryl Dare, MD, [facility] PROCEDURE DATE:  05/15/2012 PROCEDURE:  EGD w/ biopsy ASA CLASS:     Class III INDICATIONS:  Dysphagia,  abnormal BA esophagram with an irregular stricture in the mid esophagus MEDICATIONS: MAC sedation, administered by CRNA and propofol (Diprivan) 80mg  IV TOPICAL ANESTHETIC: none DESCRIPTION OF PROCEDURE: After the risks benefits and alternatives of the procedure were thoroughly explained, informed consent was obtained.  The LB GIF-H180 [id number] endoscope was introduced through the mouth and advanced to the second portion of the duodenum. Without limitations.  The instrument was slowly withdrawn as the mucosa was fully examined.  ESOPHAGUS: A stricture was found in the middle third of the esophagus from 25-28 cm from the incisors. It was ulcerated, irregular, firm and friable. The stenosis was traversable with the endoscope.  Multiple biopsies were performed.  The esophagus was otherwise normal. STOMACH: The mucosa of the stomach appeared normal. DUODENUM: The duodenal mucosa showed no abnormalities in the bulb and second portion of the duodenum.  Retroflexed views revealed no abnormalities.   The scope was then withdrawn from the patient and the procedure completed.  COMPLICATIONS: There were no complications.  ENDOSCOPIC IMPRESSION: 1.   Stricture, irregular and ulcerated in the middle third of the esophagus; multiple biopsies 2.   The EGD was otherwise normal  RECOMMENDATIONS: 1.  Await pathology results 2.  PPI: omeprazole 40 mg po qam, with 1 year of refills 3.  Soft foods    eSigned:  Meryl Dare, MD, Clementeen Graham 05/15/2012 2:50 PM   XB:JYNWG Jarrett Soho, MD

## 2012-05-15 NOTE — Progress Notes (Signed)
Please call daughter Ginger Smothers at [phone] with path results from procedure 05-15-12. ewm

## 2012-05-15 NOTE — Progress Notes (Signed)
Propofol given over incremental dosages 

## 2012-05-15 NOTE — Telephone Encounter (Signed)
Done in error.

## 2012-05-15 NOTE — Patient Instructions (Addendum)
A rx was sent to [facility] [place] for omeprazole to be taken every morning on a empty stomach.  Per Dr. Russella Dar stay on a soft diet and a handout was given to your care partner on a soft diet.  You may resume your current medications today.  Please call if any questions or concerns.   YOU HAD AN ENDOSCOPIC PROCEDURE TODAY AT THE [facility]: Refer to the procedure report that was given to you for any specific questions about what was found during the examination.  If the procedure report does not answer your questions, please call your gastroenterologist to clarify.  If you requested that your care partner not be given the details of your procedure findings, then the procedure report has been included in a sealed envelope for you to review at your convenience later.  YOU SHOULD EXPECT: Some feelings of bloating in the abdomen. Passage of more gas than usual.  Walking can help get rid of the air that was put into your GI tract during the procedure and reduce the bloating. If you had a lower endoscopy (such as a colonoscopy or flexible sigmoidoscopy) you may notice spotting of blood in your stool or on the toilet paper. If you underwent a bowel prep for your procedure, then you may not have a normal bowel movement for a few days.  DIET:   Drink plenty of fluids but you should avoid alcoholic beverages for 24 hours.  Please follow a soft diet.  Handout given to your daughter.  ACTIVITY: Your care partner should take you home directly after the procedure.  You should plan to take it easy, moving slowly for the rest of the day.  You can resume normal activity the day after the procedure however you should NOT DRIVE or use heavy machinery for 24 hours (because of the sedation medicines used during the test).    SYMPTOMS TO REPORT IMMEDIATELY: A gastroenterologist can be reached at any hour.  During normal business hours, 8:30 AM to 5:00 PM Monday through Friday, call [phone].  After hours  and on weekends, please call the GI answering service at [phone] who will take a message and have the physician on call contact you.     Following upper endoscopy (EGD)  Vomiting of blood or coffee ground material  New chest pain or pain under the shoulder blades  Painful or persistently difficult swallowing  New shortness of breath  Fever of 100F or higher  Black, tarry-looking stools  FOLLOW UP: If any biopsies were taken you will be contacted by phone or by letter within the next 1-3 weeks.  Call your gastroenterologist if you have not heard about the biopsies in 3 weeks.  Our staff will call the home number listed on your records the next business day following your procedure to check on you and address any questions or concerns that you may have at that time regarding the information given to you following your procedure. This is a courtesy call and so if there is no answer at the home number and we have not heard from you through the emergency physician on call, we will assume that you have returned to your regular daily activities without incident.  SIGNATURES/CONFIDENTIALITY: You and/or your care partner have signed paperwork which will be entered into your electronic medical record.  These signatures attest to the fact that that the information above on your After Visit Summary has been reviewed and is understood.  Full responsibility of  the confidentiality of this discharge information lies with you and/or your care-partner.

## 2012-05-15 NOTE — Progress Notes (Signed)
No complaints noted in the recovery room. Maw  Patient did not experience any of the following events: a burn prior to discharge; a fall within the facility; wrong site/side/patient/procedure/implant event; or a hospital transfer or hospital admission upon discharge from the facility. (G8907) Patient did not have preoperative order for IV antibiotic SSI prophylaxis. (G8918)  

## 2012-05-16 ENCOUNTER — Telehealth: Payer: Self-pay

## 2012-05-16 ENCOUNTER — Telehealth: Payer: Self-pay | Admitting: Gastroenterology

## 2012-05-16 NOTE — Telephone Encounter (Signed)
Called patients daughter Ginger (as patient and her daughter requested) with results of esophageal biopsies showing squamous cell cancer.  Will schedule a CT scan and Oncology consult to start.

## 2012-05-16 NOTE — Telephone Encounter (Signed)
  Follow up Call-  Call back number 05/15/2012 05/15/2012 05/15/2012  Post procedure Call Back phone  # [phone] or daughter [phone]- Ginger , call daughter first - [phone]  Permission to leave phone message - Yes Yes     Patient questions:  Do you have a fever, pain , or abdominal swelling? no Pain Score  0 *  Have you tolerated food without any problems? yes  Have you been able to return to your normal activities? yes  Do you have any questions about your discharge instructions: Diet   no Medications  no Follow up visit  no  Do you have questions or concerns about your Care? no  Actions: * If pain score is 4 or above: No action needed, pain <4.  Per the pt's daughter, Ginger Smothers, no problems last pm. Maw

## 2012-05-17 ENCOUNTER — Other Ambulatory Visit: Payer: Self-pay

## 2012-05-17 DIAGNOSIS — C159 Malignant neoplasm of esophagus, unspecified: Secondary | ICD-10-CM

## 2012-05-20 ENCOUNTER — Ambulatory Visit (INDEPENDENT_AMBULATORY_CARE_PROVIDER_SITE_OTHER)
Admission: RE | Admit: 2012-05-20 | Discharge: 2012-05-20 | Disposition: A | Discharge status: Home / Self Care | Payer: Medicare Other | Source: Ambulatory Visit | Attending: Gastroenterology | Admitting: Gastroenterology

## 2012-05-20 ENCOUNTER — Other Ambulatory Visit (INDEPENDENT_AMBULATORY_CARE_PROVIDER_SITE_OTHER): Payer: Medicare Other

## 2012-05-20 ENCOUNTER — Other Ambulatory Visit: Payer: PRIVATE HEALTH INSURANCE

## 2012-05-20 DIAGNOSIS — C159 Malignant neoplasm of esophagus, unspecified: Secondary | ICD-10-CM

## 2012-05-20 LAB — CREATININE, SERUM: Creatinine, Ser: 0.8 mg/dL (ref 0.4–1.2)

## 2012-05-20 LAB — BUN: BUN: 8 mg/dL (ref 6–23)

## 2012-05-20 MED ORDER — IOHEXOL 300 MG/ML  SOLN
100.0000 mL | Freq: Once | INTRAMUSCULAR | Status: AC | PRN
  Administered 2012-05-20: 100 mL via INTRAVENOUS

## 2012-05-22 ENCOUNTER — Other Ambulatory Visit: Payer: Self-pay | Admitting: *Deleted

## 2012-05-22 ENCOUNTER — Telehealth: Payer: Self-pay | Admitting: *Deleted

## 2012-05-22 DIAGNOSIS — C159 Malignant neoplasm of esophagus, unspecified: Secondary | ICD-10-CM

## 2012-05-22 NOTE — Telephone Encounter (Signed)
Dr. Truett Perna reviewed patient's pathology and scans and requested appointment with radiation oncologist in addition to medical oncologist.  Spoke with patient's daughter, Jamie [name], (patient requested all calls to her daughter) and gave appointments for Dr. Mitzi Hansen and Dr. Truett Perna.  She confirmed appointments for 12/13 and 12/17.  Contact names and phone numbers were given.  She verbalized comprehension and was without questions.

## 2012-05-22 NOTE — Telephone Encounter (Signed)
C/D 05/22/12 for appt.05/28/12

## 2012-05-23 ENCOUNTER — Telehealth: Payer: Self-pay | Admitting: Oncology

## 2012-05-23 NOTE — Telephone Encounter (Signed)
NP appt confirmed with pt w/GIna.  Welcome packet mailed w/calendar.

## 2012-05-24 ENCOUNTER — Encounter: Payer: Self-pay | Admitting: Radiation Oncology

## 2012-05-24 ENCOUNTER — Telehealth: Payer: Self-pay | Admitting: *Deleted

## 2012-05-24 ENCOUNTER — Ambulatory Visit
Admission: RE | Admit: 2012-05-24 | Discharge: 2012-05-24 | Disposition: A | Discharge status: Home / Self Care | Payer: Medicare Other | Source: Ambulatory Visit | Attending: Radiation Oncology | Admitting: Radiation Oncology

## 2012-05-24 VITALS — BP 159/61 | HR 58 | Temp 98.2°F | Wt 119.1 lb

## 2012-05-24 DIAGNOSIS — Z79899 Other long term (current) drug therapy: Secondary | ICD-10-CM | POA: Insufficient documentation

## 2012-05-24 DIAGNOSIS — J4489 Other specified chronic obstructive pulmonary disease: Secondary | ICD-10-CM | POA: Insufficient documentation

## 2012-05-24 DIAGNOSIS — I10 Essential (primary) hypertension: Secondary | ICD-10-CM | POA: Insufficient documentation

## 2012-05-24 DIAGNOSIS — C154 Malignant neoplasm of middle third of esophagus: Secondary | ICD-10-CM | POA: Insufficient documentation

## 2012-05-24 DIAGNOSIS — Z801 Family history of malignant neoplasm of trachea, bronchus and lung: Secondary | ICD-10-CM | POA: Insufficient documentation

## 2012-05-24 DIAGNOSIS — C159 Malignant neoplasm of esophagus, unspecified: Secondary | ICD-10-CM

## 2012-05-24 DIAGNOSIS — J449 Chronic obstructive pulmonary disease, unspecified: Secondary | ICD-10-CM | POA: Insufficient documentation

## 2012-05-24 DIAGNOSIS — R1310 Dysphagia, unspecified: Secondary | ICD-10-CM | POA: Insufficient documentation

## 2012-05-24 DIAGNOSIS — E785 Hyperlipidemia, unspecified: Secondary | ICD-10-CM | POA: Insufficient documentation

## 2012-05-24 HISTORY — DX: Flushing: R23.2

## 2012-05-24 HISTORY — DX: Dysphagia, unspecified: R13.10

## 2012-05-24 HISTORY — DX: Nonrheumatic mitral (valve) insufficiency: I34.0

## 2012-05-24 HISTORY — DX: Chronic obstructive pulmonary disease, unspecified: J44.9

## 2012-05-24 HISTORY — DX: Allergy, unspecified, initial encounter: T78.40XA

## 2012-05-24 HISTORY — DX: Malignant neoplasm of esophagus, unspecified: C15.9

## 2012-05-24 NOTE — Telephone Encounter (Signed)
CALLED PATIENT  TO INFORM OF TEST FOR 05-30-12- ARRIVAL TIME -6:45 AM AT [facility], SPOKE WITH PATIENT AND SHE IS AWARE OF THIS TEST.

## 2012-05-24 NOTE — Progress Notes (Signed)
[facility] visit today for assessment of Esophageal Cancer.  States dyshagia noted prior to diagnosis.  She had dilatation of her esophagus and she states she can now eat anything.  At the time of the dilatation it was noted that she had a suspicious area which prompted her biospy.  Note firm nodule, left jaw behind jaw, but she reports it is going down.  She denies any weight loss.

## 2012-05-27 ENCOUNTER — Telehealth: Payer: Self-pay | Admitting: Oncology

## 2012-05-27 NOTE — Progress Notes (Signed)
Radiation Oncology         [phone] ________________________________  Name: Michelle Nasuti MRN: [id number]  Date: 05/24/2012  DOB: [birth year]  WU:JWJX,BJYNW B., MD  Ladene Artist, MD     REFERRING PHYSICIAN: Ladene Artist, MD   DIAGNOSIS: The primary encounter diagnosis was Esophageal cancer. A diagnosis of Carcinoma of middle third of esophagus was also pertinent to this visit.   HISTORY OF PRESENT ILLNESS::Jamie [name] is a 78 y.o. female who is seen for an initial consultation visit. The patient is accompanied by her daughter today. She has had by their description a history of some difficulty swallowing, especially with larger pills. This has been going on for an unknown period of time, potentially a couple of years. The daughter states that the mother has not really complained of this but she would notice that she grimace sometimes when swallowing.  The patient therefore was evaluated for this and proceeded with an upper endoscopy. A stricture was found in the middle third of the esophagus from 25-28 cm from the incisors. It was ulcerated and irregular, firm and friable. The scope was able to pass this area. Multiple biopsies were performed. These returned positive for invasive squamous cell carcinoma.  The patient has undergone a CT scan of the chest abdomen and pelvis. The described area on endoscopy was not well seen on CT scan. No other suspicious findings for metastatic disease were seen in the chest, and also the CT scan of the abdomen and pelvis was also negative for metastatic disease.  The patient does no one left neck node just behind the angle of the mandible. She states that this was larger but has decreased in size in the last couple of weeks. Otherwise she denies any other symptoms. No pain.   PREVIOUS RADIATION THERAPY: No   PAST MEDICAL HISTORY:  has a past medical history of Hypertension; Anxiety; Hyperlipidemia; Adenomatous colon polyp (12/1993);  Diverticulosis; Esophageal cancer (05/15/12); Dysphagia; Hot flashes; COPD (chronic obstructive pulmonary disease); Mitral valve insufficiency; and Allergy.     PAST SURGICAL HISTORY: Past Surgical History  Procedure Date  . Tubal ligation   . Tonsillectomy   . Anterior and posterior vaginal repair w/ sacrospinous ligament suspension   . Colon polyps     hx adenomatous  . Colonoscopy 2008    neg  . Abdominal hysterectomy 2956,2130    salpingo-opherctomy 1986  . Exploratory laparotomy 1985  . Orif left elbow 05/13/08    s/p fall, olecranon fracture  . Biopsy of esophagus 05/15/12    Invasive Squamous Cell Carcinoma     FAMILY HISTORY: family history includes Alzheimer's disease in her mother; CVA in her mother; Cancer in her mother; Colon polyps in her daughter; Kidney failure in her father; and Lung cancer in her mother.   SOCIAL HISTORY:  reports that she has been smoking Cigarettes.  She has a 15 pack-year smoking history. She has never used smokeless tobacco. She reports that she does not drink alcohol or use illicit drugs.   ALLERGIES: Lipitor   MEDICATIONS:  Current Outpatient Prescriptions  Medication Sig Dispense Refill  . ALPRAZolam (XANAX) 0.5 MG tablet Take 0.5 mg by mouth at bedtime as needed.      Marland Kitchen aspirin 325 MG tablet Take 325 mg by mouth daily.      . Cholecalciferol (VITAMIN D-3) 1000 UNITS CAPS Take 1 capsule by mouth daily.      . citalopram (CELEXA) 20 MG tablet Take 20 mg by  mouth daily.      Marland Kitchen estradiol (VIVELLE-DOT) 0.0375 MG/24HR Place 1 patch onto the skin 2 (two) times a week.      . metoprolol succinate (TOPROL-XL) 25 MG 24 hr tablet Take 25 mg by mouth daily.      . Misc Natural Products (OSTEO BI-FLEX ADV DOUBLE ST PO) Take 1 capsule by mouth daily.      Marland Kitchen omeprazole (PRILOSEC) 40 MG capsule Take 1 capsule (40 mg total) by mouth daily.  30 capsule  11     REVIEW OF SYSTEMS:  A 15 point review of systems is documented in the electronic medical  record. This was obtained by the nursing staff. However, I reviewed this with the patient to discuss relevant findings and make appropriate changes.  Pertinent items are noted in HPI.    PHYSICAL EXAM:  vitals were not taken for this visit.  General: Well-developed, in no acute distress HEENT: Normocephalic, atraumatic; oral cavity clear Neck: Supple without any lymphadenopathy Cardiovascular: Regular rate and rhythm Respiratory: Clear to auscultation bilaterally GI: Soft, nontender, normal bowel sounds Extremities: No edema present Neuro: No focal deficits     LABORATORY DATA:  Lab Results  Component Value Date   HGB 15.0 06/01/2008   HCT 44.0 06/01/2008   Lab Results  Component Value Date   NA 140 06/01/2008   K 4.2 06/01/2008   CL 104 06/01/2008   No results found for this basename: ALT, AST, GGT, ALKPHOS, BILITOT      RADIOGRAPHY: Ct Chest W Contrast  05/20/2012  *RADIOLOGY REPORT*  Clinical Data:  Recent diagnosis of esophageal carcinoma.  Status post endoscopy 05/15/12.  CT CHEST, ABDOMEN AND PELVIS WITH CONTRAST  Technique:  Multidetector CT imaging of the chest, abdomen and pelvis was performed following the standard protocol during bolus administration of intravenous contrast.  Contrast: OMNIPAQUE IOHEXOL 300 MG/ML  SOLN  Comparison:  CT chest, abdomen and pelvis 12/19/2005.  CT CHEST  Findings:  Known ulcerated mass and stricture of the middle third of the esophagus is difficult to appreciate on CT scan.  Walls of the esophagus appear mildly irregular and thickened at and just below the level of the carina and this may represent the patient's primary lesion.  No axillary, hilar, mediastinal or paraesophageal lymphadenopathy is identified.  Heart size is mildly enlarged. There is no pleural or pericardial effusion.  The lungs demonstrate only some linear atelectasis or scarring in the bases.  A 0.5 cm nodule is seen in the left upper lobe on image 14 and is stable.  No  consolidative process is identified. No focal bony abnormality is present.  IMPRESSION:  1.  Known esophageal mass is difficult to visualize but appears to lie at and just below the level of the carina. 2.  0.5 cm left upper lobe pulmonary nodule is stable back to 2007 consistent with benignity.  No evidence of metastatic disease is seen.  CT ABDOMEN AND PELVIS  Findings:  The liver, spleen, kidneys, adrenal glands and pancreas all appear normal.  No lymphadenopathy or fluid is identified. Scattered aortic and iliac atherosclerosis without aneurysm is noted.  The patient is status post hysterectomy.  The stomach and small and large bowel appear normal.  The appendix is unremarkable. The uterus has been removed.  Bones demonstrate a small Schmorl's node in the superior plate of L5 with surrounding sclerosis.  No worrisome osseous lesion is identified.  IMPRESSION:  1.  Negative for metastatic disease.  No acute finding.  2.  Atherosclerosis. 3.  Status post hysterectomy.   Original Report Authenticated By: Holley Dexter, M.D.    Ct Abdomen Pelvis W Contrast  05/20/2012  *RADIOLOGY REPORT*  Clinical Data:  Recent diagnosis of esophageal carcinoma.  Status post endoscopy 05/15/12.  CT CHEST, ABDOMEN AND PELVIS WITH CONTRAST  Technique:  Multidetector CT imaging of the chest, abdomen and pelvis was performed following the standard protocol during bolus administration of intravenous contrast.  Contrast: OMNIPAQUE IOHEXOL 300 MG/ML  SOLN  Comparison:  CT chest, abdomen and pelvis 12/19/2005.  CT CHEST  Findings:  Known ulcerated mass and stricture of the middle third of the esophagus is difficult to appreciate on CT scan.  Walls of the esophagus appear mildly irregular and thickened at and just below the level of the carina and this may represent the patient's primary lesion.  No axillary, hilar, mediastinal or paraesophageal lymphadenopathy is identified.  Heart size is mildly enlarged. There is no pleural or  pericardial effusion.  The lungs demonstrate only some linear atelectasis or scarring in the bases.  A 0.5 cm nodule is seen in the left upper lobe on image 14 and is stable.  No consolidative process is identified. No focal bony abnormality is present.  IMPRESSION:  1.  Known esophageal mass is difficult to visualize but appears to lie at and just below the level of the carina. 2.  0.5 cm left upper lobe pulmonary nodule is stable back to 2007 consistent with benignity.  No evidence of metastatic disease is seen.  CT ABDOMEN AND PELVIS  Findings:  The liver, spleen, kidneys, adrenal glands and pancreas all appear normal.  No lymphadenopathy or fluid is identified. Scattered aortic and iliac atherosclerosis without aneurysm is noted.  The patient is status post hysterectomy.  The stomach and small and large bowel appear normal.  The appendix is unremarkable. The uterus has been removed.  Bones demonstrate a small Schmorl's node in the superior plate of L5 with surrounding sclerosis.  No worrisome osseous lesion is identified.  IMPRESSION:  1.  Negative for metastatic disease.  No acute finding. 2.  Atherosclerosis. 3.  Status post hysterectomy.   Original Report Authenticated By: Holley Dexter, M.D.        IMPRESSION: The patient is an 78 year old female with a recent diagnosis of squamous cell carcinoma of the middle third of the esophagus. CT staging studies did not reveal any evidence of regional/distant disease.   PLAN: The patient requires further workup at this point to fully stage her. I discussed this with her and I have ordered a PET scan. Depending on the results of this, endoscopic ultrasound may also be appropriate. No sign of distant disease at this time.  I therefore discussed with the patient the potential role for radiotherapy. We also discussed additional treatment modalities including chemotherapy and surgery. Although I have ordered additional imaging, I suspect that radiotherapy will  be a component of her overall treatment plan and we therefore discussed this in some detail. We discussed a 5-1/2 week course of treatment, often with concurrent chemotherapy, and we discussed the potential side effects and risks of such a treatment. All of her questions were answered.  Both a PET scan and a simulation in our department for treatment planning have been ordered. She is also scheduled to see Dr. Truett Perna in medical oncology next week.    I spent 60 minutes minutes face to face with the patient and more than 50% of that time was spent in  counseling and/or coordination of care.    ________________________________   Radene Gunning, MD, PhD

## 2012-05-27 NOTE — Telephone Encounter (Signed)
New pt. pof sent on 12/11 for nutrition appt. Added apptf for 12/20 (next available) pt to be given nutr appt tomorrow - added comment to 12/17 NP appt to send pt for schedule.

## 2012-05-28 ENCOUNTER — Ambulatory Visit: Payer: Medicare Other

## 2012-05-28 ENCOUNTER — Ambulatory Visit (HOSPITAL_BASED_OUTPATIENT_CLINIC_OR_DEPARTMENT_OTHER): Payer: Medicare Other | Admitting: Oncology

## 2012-05-28 ENCOUNTER — Encounter: Payer: Self-pay | Admitting: Oncology

## 2012-05-28 VITALS — BP 176/67 | HR 60 | Temp 97.2°F | Resp 97 | Ht 61.0 in | Wt 120.4 lb

## 2012-05-28 DIAGNOSIS — C159 Malignant neoplasm of esophagus, unspecified: Secondary | ICD-10-CM

## 2012-05-28 DIAGNOSIS — J449 Chronic obstructive pulmonary disease, unspecified: Secondary | ICD-10-CM

## 2012-05-28 DIAGNOSIS — J4489 Other specified chronic obstructive pulmonary disease: Secondary | ICD-10-CM

## 2012-05-28 DIAGNOSIS — R1310 Dysphagia, unspecified: Secondary | ICD-10-CM

## 2012-05-28 DIAGNOSIS — F17200 Nicotine dependence, unspecified, uncomplicated: Secondary | ICD-10-CM

## 2012-05-28 DIAGNOSIS — C154 Malignant neoplasm of middle third of esophagus: Secondary | ICD-10-CM

## 2012-05-28 NOTE — Progress Notes (Signed)
[facility] New Patient Consult   Referring MD: Claudette Head   [facility] 78 y.o.  January 04, 1929    Reason for Referral: Esophagus cancer     HPI: She reports a 6 month to one-year history of solid dysphagia. She was referred to Dr. Russella Dar and underwent an upper endoscopy on 05/15/2012. A stricture was found in the middle third of the esophagus from 25-28 cm. The stricture was ulcerated, irregular, firm, and friable. The stenosis was traversed with the endoscope. Multiple biopsies were obtained. The stomach appeared normal. The pathology ([id number]) confirmed invasive squamous cell carcinoma.  She saw Dr. Mitzi Hansen and is scheduled to begin radiation planning on 05/29/2012.  She reports no weight loss. She has no other complaint.  Past Medical History  Diagnosis Date  . Hypertension   . Anxiety   . Hyperlipidemia   . Adenomatous colon polyp 12/1993  . Diverticulosis   . Esophageal cancer 05/15/12    bx=, middle,Invasive squamous cell ca,  .  osteoporosis        . Hot flashes   . COPD (chronic obstructive pulmonary disease)   . Mitral valve insufficiency   . Allergy     lipitor   .    G3 P3  Past Surgical History  Procedure Date  . Tubal ligation   . Tonsillectomy   . Anterior and posterior vaginal repair w/ sacrospinous ligament suspension   . Colon polyps     hx adenomatous  . Colonoscopy 2008    neg  . Abdominal hysterectomy [id number]    salpingo-opherctomy 1986  . Exploratory laparotomy 1985  . Orif left elbow 05/13/08    s/p fall, olecranon fracture  . Biopsy of esophagus 05/15/12    Invasive Squamous Cell Carcinoma    Family History  Problem Relation Age of Onset  . CVA Mother   . Lung cancer Mother   . Alzheimer's disease Mother   .  prostate cancer   father    . Kidney failure Father   . Colon polyps Daughter   Head and neck cancer, lung cancer-present on the mother's side of the family  Current outpatient  prescriptions:ALPRAZolam (XANAX) 0.5 MG tablet, Take 0.5 mg by mouth at bedtime as needed., Disp: , Rfl: ;  aspirin 325 MG tablet, Take 325 mg by mouth daily., Disp: , Rfl: ;  Cholecalciferol (VITAMIN D-3) 1000 UNITS CAPS, Take 1 capsule by mouth daily., Disp: , Rfl: ;  citalopram (CELEXA) 20 MG tablet, Take 20 mg by mouth daily., Disp: , Rfl:  metoprolol succinate (TOPROL-XL) 25 MG 24 hr tablet, Take 25 mg by mouth daily., Disp: , Rfl: ;  Misc Natural Products (OSTEO BI-FLEX ADV DOUBLE ST PO), Take 1 capsule by mouth daily., Disp: , Rfl: ;  omeprazole (PRILOSEC) 40 MG capsule, Take 1 capsule (40 mg total) by mouth daily., Disp: 30 capsule, Rfl: 11  Allergies:  Allergies  Allergen Reactions  . Lipitor (Atorvastatin)     Aching, musculoskeletal    Social History: She lives alone in [place]. She is a retired [employer] operational officer. She currently smokes one half pack of cigarettes per day. No alcohol use. No transfusion history. No risk factor for HIV or hepatitis.  ROS:   Positives include: Cough, bronchitis approximately once per year, fatigue , solid dysphagia  A complete ROS was otherwise negative.  Physical Exam:  Blood pressure 176/67, pulse 60, temperature 97.2 F (36.2 C), temperature source Oral, resp. rate 97, height 5\' 1"  (1.549 m), weight 120  lb 6.4 oz (54.613 kg).  HEENT: Oropharynx without visible mass, neck without mass Lungs: End inspiratory rales at the left base, distant breath sounds Cardiac: Regular rate and rhythm Abdomen: No hepatosplenomegaly, no mass, nontender  Vascular: No leg edema Lymph nodes: No cervical, supra-clavicular, axillary, or inguinal nodes Neurologic: Alert and oriented, the motor exam appears intact in the upper and lower extremities  Radiology: CTs of the chest, abdomen, and pelvis on 05/20/2012-mild irregularity and thickening of the esophagus wall below the level of the carina. No axillary, hilar, mediastinal, or paraesophageal lymphadenopathy. No pleural or  pericardial effusion. Linear atelectasis or scarring at the bases. 0.5 cm left upper lobe nodule is stable compared to a scan from 2007. The liver, adrenal glands, and pancreas appeared normal. No lymphadenopathy in the abdomen or pelvis.    Assessment/Plan:   1. Squamous cell carcinoma of the midesophagus, staging CT evaluation on 05/20/2012 without evidence of metastatic disease 2. COPD 3. Ongoing tobacco use 4. Solid dysphagia secondary to the partially obstructing esophagus mass 5. Hypertension    Disposition:   She has been diagnosed with squamous cell carcinoma the esophagus. I discussed the diagnosis and treatment options with Ms. Tallie and her family. Her case will be presented at the GI tumor conference on 05/29/2012. She will complete a staging evaluation with an endoscopic ultrasound and/or PET scan.  She may be a candidate for an esophagectomy procedure if there is no evidence for transmural invasion/metastatic lymphadenopathy on an endoscopic ultrasound. However her age and COPD may limit surgical options.  I explained the majority of patients are treated with concurrent chemotherapy and radiation. She saw Dr. Mitzi Hansen and is scheduled to begin radiation planning on 05/29/2012. I recommend concurrent weekly Taxol/carboplatin and radiation if she is not a surgical candidate.  We made a referral to Dr. Christella Hartigan for an endoscopic ultrasound. Ms. Shadduck will return for a chemotherapy teaching class and further discussion on 06/06/2012.  Abrie Egloff 05/28/2012, 6:08 PM

## 2012-05-28 NOTE — Progress Notes (Signed)
Met with patient and her children.  Reviewed role of nurse navigator.  Information given on esophageal caner along with resource names and numbers for cancer center and staff.  Patient to be discussed at GI conference tomorrow morning.  Dr. Truett Perna or myself to notify family with the plan of treatment.

## 2012-05-29 ENCOUNTER — Telehealth: Payer: Self-pay

## 2012-05-29 ENCOUNTER — Telehealth: Payer: Self-pay | Admitting: *Deleted

## 2012-05-29 ENCOUNTER — Telehealth: Payer: Self-pay | Admitting: Oncology

## 2012-05-29 ENCOUNTER — Other Ambulatory Visit: Payer: Self-pay

## 2012-05-29 ENCOUNTER — Encounter (HOSPITAL_COMMUNITY): Payer: Self-pay | Admitting: Pharmacy Technician

## 2012-05-29 ENCOUNTER — Other Ambulatory Visit: Payer: Self-pay | Admitting: *Deleted

## 2012-05-29 ENCOUNTER — Ambulatory Visit
Admission: RE | Admit: 2012-05-29 | Discharge: 2012-05-29 | Disposition: A | Discharge status: Home / Self Care | Payer: Medicare Other | Source: Ambulatory Visit | Attending: Radiation Oncology | Admitting: Radiation Oncology

## 2012-05-29 DIAGNOSIS — C159 Malignant neoplasm of esophagus, unspecified: Secondary | ICD-10-CM | POA: Insufficient documentation

## 2012-05-29 DIAGNOSIS — Z7982 Long term (current) use of aspirin: Secondary | ICD-10-CM | POA: Insufficient documentation

## 2012-05-29 DIAGNOSIS — Z79899 Other long term (current) drug therapy: Secondary | ICD-10-CM | POA: Insufficient documentation

## 2012-05-29 DIAGNOSIS — K209 Esophagitis, unspecified without bleeding: Secondary | ICD-10-CM | POA: Insufficient documentation

## 2012-05-29 DIAGNOSIS — Z510 Encounter for antineoplastic radiation therapy: Secondary | ICD-10-CM | POA: Insufficient documentation

## 2012-05-29 DIAGNOSIS — C154 Malignant neoplasm of middle third of esophagus: Secondary | ICD-10-CM

## 2012-05-29 NOTE — Telephone Encounter (Signed)
Patient is scheduled for EUS at [facility] on 06/03/12 12:00.  I spoke with the patient's daughter Ginger, she is aware of the appt date and time.  She is instructed to make sure her mother is NPO after 7:00 am.  I will mail her instructions to her daughter's house.

## 2012-05-29 NOTE — Telephone Encounter (Signed)
Reschedule nut appt to Dec 27th...per staff msg pt's daughter going to pick up new schedule

## 2012-05-29 NOTE — Telephone Encounter (Signed)
Per request of Dr. Truett Perna, Spoke with patient's daughter, Jamie [name], by phone to discuss recommendations for EUS per cancer conference discussions this morning.  This RN reviewed the schedule and answered her questions.  She will be re-scheduled to see the dietician on a different date per daughter's request.  She was appreciative of the call.  Will continue to follow.

## 2012-05-29 NOTE — Telephone Encounter (Signed)
Message copied by Annett Fabian on Wed May 29, 2012  8:51 AM ------      Message from: Rob Bunting P      Created: Wed May 29, 2012  7:34 AM       Lavonna Rua,       Not sure who to send this to since Alexia Freestone is out and her sub yesterday Marchelle Folks) is out also today.            This woman needs an upper EUS, radial +/- linear, 60 min, squamous cell esophageal cancer, moderate sedation is OK, would like this Friday lunch this week or Monday lunch next week at [facility].            All the info is in EPIC, seen by Russella Dar and [place] recently.            Let me know where you put her.

## 2012-05-30 ENCOUNTER — Encounter (HOSPITAL_COMMUNITY): Payer: Self-pay

## 2012-05-30 ENCOUNTER — Encounter (HOSPITAL_COMMUNITY)
Admission: RE | Admit: 2012-05-30 | Discharge: 2012-05-30 | Disposition: A | Discharge status: Home / Self Care | Payer: Medicare Other | Source: Ambulatory Visit | Attending: Radiation Oncology | Admitting: Radiation Oncology

## 2012-05-30 ENCOUNTER — Other Ambulatory Visit (HOSPITAL_COMMUNITY): Payer: Medicare Other

## 2012-05-30 DIAGNOSIS — C159 Malignant neoplasm of esophagus, unspecified: Secondary | ICD-10-CM

## 2012-05-30 MED ORDER — FLUDEOXYGLUCOSE F - 18 (FDG) INJECTION
20.9000 | Freq: Once | INTRAVENOUS | Status: AC | PRN
  Administered 2012-05-30: 20.9 via INTRAVENOUS

## 2012-05-31 ENCOUNTER — Encounter: Payer: Medicare Other | Admitting: Nutrition

## 2012-05-31 ENCOUNTER — Other Ambulatory Visit: Payer: Self-pay | Admitting: *Deleted

## 2012-05-31 ENCOUNTER — Other Ambulatory Visit: Payer: Self-pay | Admitting: Oncology

## 2012-06-03 ENCOUNTER — Encounter (HOSPITAL_COMMUNITY): Payer: Self-pay

## 2012-06-03 ENCOUNTER — Encounter (HOSPITAL_COMMUNITY)
Admission: RE | Disposition: A | Discharge status: Home / Self Care | Payer: Self-pay | Source: Ambulatory Visit | Attending: Gastroenterology

## 2012-06-03 ENCOUNTER — Ambulatory Visit (HOSPITAL_COMMUNITY)
Admission: RE | Admit: 2012-06-03 | Discharge: 2012-06-03 | Disposition: A | Discharge status: Home / Self Care | Payer: Medicare Other | Source: Ambulatory Visit | Attending: Gastroenterology | Admitting: Gastroenterology

## 2012-06-03 ENCOUNTER — Telehealth: Payer: Self-pay | Admitting: Oncology

## 2012-06-03 DIAGNOSIS — I10 Essential (primary) hypertension: Secondary | ICD-10-CM | POA: Insufficient documentation

## 2012-06-03 DIAGNOSIS — F17200 Nicotine dependence, unspecified, uncomplicated: Secondary | ICD-10-CM | POA: Insufficient documentation

## 2012-06-03 DIAGNOSIS — J449 Chronic obstructive pulmonary disease, unspecified: Secondary | ICD-10-CM | POA: Insufficient documentation

## 2012-06-03 DIAGNOSIS — Z79899 Other long term (current) drug therapy: Secondary | ICD-10-CM | POA: Insufficient documentation

## 2012-06-03 DIAGNOSIS — M810 Age-related osteoporosis without current pathological fracture: Secondary | ICD-10-CM | POA: Insufficient documentation

## 2012-06-03 DIAGNOSIS — J4489 Other specified chronic obstructive pulmonary disease: Secondary | ICD-10-CM | POA: Insufficient documentation

## 2012-06-03 DIAGNOSIS — E785 Hyperlipidemia, unspecified: Secondary | ICD-10-CM | POA: Insufficient documentation

## 2012-06-03 DIAGNOSIS — C159 Malignant neoplasm of esophagus, unspecified: Secondary | ICD-10-CM

## 2012-06-03 DIAGNOSIS — R1310 Dysphagia, unspecified: Secondary | ICD-10-CM | POA: Insufficient documentation

## 2012-06-03 HISTORY — PX: EUS: SHX5427

## 2012-06-03 SURGERY — UPPER ENDOSCOPIC ULTRASOUND (EUS) LINEAR
Anesthesia: Moderate Sedation

## 2012-06-03 MED ORDER — BUTAMBEN-TETRACAINE-BENZOCAINE 2-2-14 % EX AERO
INHALATION_SPRAY | CUTANEOUS | Status: DC | PRN
  Administered 2012-06-03: 2 via TOPICAL

## 2012-06-03 MED ORDER — MIDAZOLAM HCL 10 MG/2ML IJ SOLN
INTRAMUSCULAR | Status: AC
  Filled 2012-06-03: qty 2

## 2012-06-03 MED ORDER — FENTANYL CITRATE 0.05 MG/ML IJ SOLN
INTRAMUSCULAR | Status: DC | PRN
  Administered 2012-06-03 (×2): 25 ug via INTRAVENOUS

## 2012-06-03 MED ORDER — MIDAZOLAM HCL 10 MG/2ML IJ SOLN
INTRAMUSCULAR | Status: DC | PRN
  Administered 2012-06-03 (×2): 2.5 mg via INTRAVENOUS

## 2012-06-03 MED ORDER — FENTANYL CITRATE 0.05 MG/ML IJ SOLN
INTRAMUSCULAR | Status: AC
  Filled 2012-06-03: qty 2

## 2012-06-03 MED ORDER — SODIUM CHLORIDE 0.9 % IV SOLN: INTRAVENOUS | Status: DC

## 2012-06-03 NOTE — Interval H&P Note (Signed)
History and Physical Interval Note:  06/03/2012 11:46 AM  Mattel  has presented today for surgery, with the diagnosis of esophageal cancer  The various methods of treatment have been discussed with the patient and family. After consideration of risks, benefits and other options for treatment, the patient has consented to  Procedure(s) (LRB) with comments: UPPER ENDOSCOPIC ULTRASOUND (EUS) LINEAR (N/A) - +/- radial and liner  as a surgical intervention .  The patient's history has been reviewed, patient examined, no change in status, stable for surgery.  I have reviewed the patient's chart and labs.  Questions were answered to the patient's satisfaction.     Rob Bunting

## 2012-06-03 NOTE — Addendum Note (Signed)
Encounter addended by: Dixon Luczak Mintz Edith Groleau, RN on: 06/03/2012  9:34 AM<BR>     Documentation filed: Charges VN

## 2012-06-03 NOTE — Telephone Encounter (Signed)
S/w pt today to confirm appt for 12/24 and pt to get schedule when she comes in. Also lmonvm for dtr on cell and called dtr's home. Per dtr's husband she is aware of the appt tomorrow.

## 2012-06-03 NOTE — Op Note (Signed)
[facility] [address] [place], [address]   ENDOSCOPIC ULTRASOUND PROCEDURE REPORT  PATIENT: Jamie, [name]  MR#: [id number] BIRTHDATE: [birth year]  GENDER: Female ENDOSCOPIST: Rachael Fee, MD REFERRED BY:  Mardelle Matte, M.D. PROCEDURE DATE:  06/03/2012 PROCEDURE:   Upper EUS ASA CLASS:      Class III INDICATIONS:   recently diagnosed squamous cell cancer in mid esophagus (EGD Dr.  Russella Dar) without clear sign of distant disease on CT, PET. MEDICATIONS: Fentanyl 50 mcg IV and Versed 5 mg IV  DESCRIPTION OF PROCEDURE:   After the risks benefits and alternatives of the procedure were  explained, informed consent was obtained. The patient was then placed in the left, lateral, decubitus postion and IV sedation was administered. Throughout the procedure, the patients blood pressure, pulse and oxygen saturations were monitored continuously.  Under direct visualization, the Pentax Radial EUS [id number]  endoscope was introduced through the mouth  and advanced to the bulb of duodenum .  Water was used as necessary to provide an acoustic interface. Upon completion of the imaging, water was removed and the patient was sent to the recovery room in satisfactory condition.   Endoscopic findings: 1. Irregular, strictured, circumferentially friable mucosa in mid esophagus from 24cm to 28cm from incisors). The 12mm echoendoscope was able to pass through the stricture with minimal resistence. 2. The distal esophagus, remaining UGI tract was nromal.  EUS findings: 1. The lesion above corresponds with a circumferential hypoechoic mass that clearly passes into and through the muscularis propria layer of the mid esophageal wall (uT3). 2.  There was no suspicious paraesophageal or celiac adenopathy.  Impression: 4cm long, circumferential, friable uT3N0 mid esophageal squamous cell cancer located from 24 to 28cm from the incisors.  She will likely benefit from  neoadjuvant chemo/xrt.   _______________________________ eSigned:  Rachael Fee, MD 06/03/2012 12:21 PM

## 2012-06-03 NOTE — H&P (View-Only) (Signed)
[facility] New Patient Consult   Referring MD: Malcolm Stark   Jamie [name] 78 y.o.  03/03/1929    Reason for Referral: Esophagus cancer     HPI: She reports a 6 month to one-year history of solid dysphagia. She was referred to Dr. Stark and underwent an upper endoscopy on 05/15/2012. A stricture was found in the middle third of the esophagus from 25-28 cm. The stricture was ulcerated, irregular, firm, and friable. The stenosis was traversed with the endoscope. Multiple biopsies were obtained. The stomach appeared normal. The pathology ([id number]) confirmed invasive squamous cell carcinoma.  She saw Dr. Moody and is scheduled to begin radiation planning on 05/29/2012.  She reports no weight loss. She has no other complaint.  Past Medical History  Diagnosis Date  . Hypertension   . Anxiety   . Hyperlipidemia   . Adenomatous colon polyp 12/1993  . Diverticulosis   . Esophageal cancer 05/15/12    bx=, middle,Invasive squamous cell ca,  .  osteoporosis        . Hot flashes   . COPD (chronic obstructive pulmonary disease)   . Mitral valve insufficiency   . Allergy     lipitor   .    G3 P3  Past Surgical History  Procedure Date  . Tubal ligation   . Tonsillectomy   . Anterior and posterior vaginal repair w/ sacrospinous ligament suspension   . Colon polyps     hx adenomatous  . Colonoscopy 2008    neg  . Abdominal hysterectomy 1976,1986    salpingo-opherctomy 1986  . Exploratory laparotomy 1985  . Orif left elbow 05/13/08    s/p fall, olecranon fracture  . Biopsy of esophagus 05/15/12    Invasive Squamous Cell Carcinoma    Family History  Problem Relation Age of Onset  . CVA Mother   . Lung cancer Mother   . Alzheimer's disease Mother   .  prostate cancer   father    . Kidney failure Father   . Colon polyps Daughter   Head and neck cancer, lung cancer-present on the mother's side of the family  Current outpatient  prescriptions:ALPRAZolam (XANAX) 0.5 MG tablet, Take 0.5 mg by mouth at bedtime as needed., Disp: , Rfl: ;  aspirin 325 MG tablet, Take 325 mg by mouth daily., Disp: , Rfl: ;  Cholecalciferol (VITAMIN D-3) 1000 UNITS CAPS, Take 1 capsule by mouth daily., Disp: , Rfl: ;  citalopram (CELEXA) 20 MG tablet, Take 20 mg by mouth daily., Disp: , Rfl:  metoprolol succinate (TOPROL-XL) 25 MG 24 hr tablet, Take 25 mg by mouth daily., Disp: , Rfl: ;  Misc Natural Products (OSTEO BI-FLEX ADV DOUBLE ST PO), Take 1 capsule by mouth daily., Disp: , Rfl: ;  omeprazole (PRILOSEC) 40 MG capsule, Take 1 capsule (40 mg total) by mouth daily., Disp: 30 capsule, Rfl: 11  Allergies:  Allergies  Allergen Reactions  . Lipitor (Atorvastatin)     Aching, musculoskeletal    Social History: She lives alone in [place]. She is a retired secretary. She currently smokes one half pack of cigarettes per day. No alcohol use. No transfusion history. No risk factor for HIV or hepatitis.  ROS:   Positives include: Cough, bronchitis approximately once per year, fatigue , solid dysphagia  A complete ROS was otherwise negative.  Physical Exam:  Blood pressure 176/67, pulse 60, temperature 97.2 F (36.2 C), temperature source Oral, resp. rate 97, height 5' 1" (1.549 m), weight 120   lb 6.4 oz (54.613 kg).  HEENT: Oropharynx without visible mass, neck without mass Lungs: End inspiratory rales at the left base, distant breath sounds Cardiac: Regular rate and rhythm Abdomen: No hepatosplenomegaly, no mass, nontender  Vascular: No leg edema Lymph nodes: No cervical, supra-clavicular, axillary, or inguinal nodes Neurologic: Alert and oriented, the motor exam appears intact in the upper and lower extremities  Radiology: CTs of the chest, abdomen, and pelvis on 05/20/2012-mild irregularity and thickening of the esophagus wall below the level of the carina. No axillary, hilar, mediastinal, or paraesophageal lymphadenopathy. No pleural or  pericardial effusion. Linear atelectasis or scarring at the bases. 0.5 cm left upper lobe nodule is stable compared to a scan from 2007. The liver, adrenal glands, and pancreas appeared normal. No lymphadenopathy in the abdomen or pelvis.    Assessment/Plan:   1. Squamous cell carcinoma of the midesophagus, staging CT evaluation on 05/20/2012 without evidence of metastatic disease 2. COPD 3. Ongoing tobacco use 4. Solid dysphagia secondary to the partially obstructing esophagus mass 5. Hypertension    Disposition:   She has been diagnosed with squamous cell carcinoma the esophagus. I discussed the diagnosis and treatment options with Jamie [name] and her family. Her case will be presented at the GI tumor conference on 05/29/2012. She will complete a staging evaluation with an endoscopic ultrasound and/or PET scan.  She may be a candidate for an esophagectomy procedure if there is no evidence for transmural invasion/metastatic lymphadenopathy on an endoscopic ultrasound. However her age and COPD may limit surgical options.  I explained the majority of patients are treated with concurrent chemotherapy and radiation. She saw Dr. Moody and is scheduled to begin radiation planning on 05/29/2012. I recommend concurrent weekly Taxol/carboplatin and radiation if she is not a surgical candidate.  We made a referral to Dr. Jacobs for an endoscopic ultrasound. Jamie [name] will return for a chemotherapy teaching class and further discussion on 06/06/2012.  [name] 05/28/2012, 6:08 PM     

## 2012-06-04 ENCOUNTER — Ambulatory Visit (HOSPITAL_BASED_OUTPATIENT_CLINIC_OR_DEPARTMENT_OTHER): Payer: Medicare Other | Admitting: Oncology

## 2012-06-04 ENCOUNTER — Encounter (HOSPITAL_COMMUNITY): Payer: Self-pay | Admitting: Gastroenterology

## 2012-06-04 ENCOUNTER — Telehealth: Payer: Self-pay | Admitting: Oncology

## 2012-06-04 VITALS — BP 156/72 | HR 61 | Temp 97.0°F | Resp 20 | Ht 61.0 in | Wt 120.2 lb

## 2012-06-04 DIAGNOSIS — C159 Malignant neoplasm of esophagus, unspecified: Secondary | ICD-10-CM

## 2012-06-04 DIAGNOSIS — R1310 Dysphagia, unspecified: Secondary | ICD-10-CM

## 2012-06-04 DIAGNOSIS — I10 Essential (primary) hypertension: Secondary | ICD-10-CM

## 2012-06-04 DIAGNOSIS — J449 Chronic obstructive pulmonary disease, unspecified: Secondary | ICD-10-CM

## 2012-06-04 DIAGNOSIS — C154 Malignant neoplasm of middle third of esophagus: Secondary | ICD-10-CM

## 2012-06-04 NOTE — Telephone Encounter (Signed)
Gave pt appt for lab and MD on on 1/10 /14 , emailed [facility] regarding chemo appts

## 2012-06-04 NOTE — Progress Notes (Signed)
   [facility]    OFFICE PROGRESS NOTE   INTERVAL HISTORY:   She returns as scheduled. No new complaint. She underwent an endoscopic ultrasound by Dr. Christella Hartigan on 06/03/2012. An irregular stricture circumferential mucosa was noted in the mid esophagus from 2428 cm. A 12 mm in the scope passed through the stricture. The ultrasound confirmed a circumferential hypoechoic mass that passed into and through the muscularis propria layer of the mid esophageal wall. No suspicious paraesophageal or celiac adenopathy. The tumor was staged as a uT3N0 lesion.  Objective:  Vital signs in last 24 hours:  Blood pressure 156/72, pulse 61, temperature 97 F (36.1 C), temperature source Oral, resp. rate 20, height 5\' 1"  (1.549 m), weight 120 lb 3.2 oz (54.522 kg).    Physical examination not performed today  Lab Results:   CBC and chemistry panel to be obtained on 06/07/2012   Medications: I have reviewed the patient's current medications.  Assessment/Plan: 1.Squamous cell carcinoma of the midesophagus, staging CT evaluation on 05/20/2012 without evidence of metastatic disease . EUS on 06/03/2012 confirmed a uT3,uN0 lesion. 2. COPD  3. Ongoing tobacco use  4. Solid dysphagia secondary to the partially obstructing esophagus mass  5. Hypertension  Disposition:  The endoscopic ultrasound confirmed a T3 lesion. I recommended proceeding with Taxol/carboplatin chemotherapy and radiation. Ms. Rancourt is scheduled to begin radiation on 06/10/2012. She will be scheduled for a first cycle of chemotherapy on 06/15/2011. The plan is to continue weekly Taxol/carboplatin throughout the course of radiation.  Ms. Tripp will return for a chemotherapy teaching class and lab visit on 06/07/2012. She will also see the nutritionist on 06/07/2012.  I reviewed the potential toxicities associated with the Taxol/carboplatin chemotherapy regimen including the chance for nausea/vomiting, mucositis, diarrhea,  alopecia, and hematologic toxicity. We discussed the bone pain, neuropathy, and allergic reaction associated with Taxol. We discussed the potential for an allergic reaction with carboplatin.  She is scheduled for an office visit and week #2 of chemotherapy on 06/22/2011.   Thornton Papas, MD  06/04/2012  4:50 PM

## 2012-06-06 ENCOUNTER — Other Ambulatory Visit: Payer: Self-pay | Admitting: *Deleted

## 2012-06-06 ENCOUNTER — Telehealth: Payer: Self-pay | Admitting: *Deleted

## 2012-06-06 ENCOUNTER — Ambulatory Visit: Payer: Self-pay | Admitting: Oncology

## 2012-06-06 ENCOUNTER — Ambulatory Visit: Payer: Medicare Other

## 2012-06-06 DIAGNOSIS — C159 Malignant neoplasm of esophagus, unspecified: Secondary | ICD-10-CM

## 2012-06-06 MED ORDER — DEXAMETHASONE 4 MG PO TABS: ORAL_TABLET | ORAL | Status: DC

## 2012-06-06 MED ORDER — PROCHLORPERAZINE MALEATE 5 MG PO TABS: 5.0000 mg | ORAL_TABLET | Freq: Four times a day (QID) | ORAL | Status: DC | PRN

## 2012-06-06 NOTE — Telephone Encounter (Signed)
Called dtr.re: steroid pre-taxol medication and nausea  Medication.  Requested that we call it in to [facility] in [place].  Pt. Is not coming for chemo tomorrow but rather on 06/15/11.  She is coming to chemo class tomorrow.  Gave dtr. Instructions on how to take the decadron.

## 2012-06-06 NOTE — Telephone Encounter (Signed)
Per staff message and POF I have scheduled appts.  JMW  

## 2012-06-07 ENCOUNTER — Telehealth: Payer: Self-pay | Admitting: Oncology

## 2012-06-07 ENCOUNTER — Ambulatory Visit: Payer: Medicare Other | Admitting: Nutrition

## 2012-06-07 ENCOUNTER — Encounter: Payer: Self-pay | Admitting: *Deleted

## 2012-06-07 ENCOUNTER — Ambulatory Visit: Payer: Self-pay

## 2012-06-07 ENCOUNTER — Other Ambulatory Visit: Payer: Medicare Other

## 2012-06-07 ENCOUNTER — Other Ambulatory Visit (HOSPITAL_BASED_OUTPATIENT_CLINIC_OR_DEPARTMENT_OTHER): Payer: Medicare Other | Admitting: Lab

## 2012-06-07 DIAGNOSIS — C159 Malignant neoplasm of esophagus, unspecified: Secondary | ICD-10-CM

## 2012-06-07 LAB — CBC WITH DIFFERENTIAL/PLATELET
BASO%: 0.9 % (ref 0.0–2.0)
EOS%: 2.5 % (ref 0.0–7.0)
Eosinophils Absolute: 0.2 10*3/uL (ref 0.0–0.5)
LYMPH%: 20.3 % (ref 14.0–49.7)
MCH: 31.8 pg (ref 25.1–34.0)
MCHC: 34.1 g/dL (ref 31.5–36.0)
MCV: 93.2 fL (ref 79.5–101.0)
MONO%: 5.7 % (ref 0.0–14.0)
Platelets: 211 10*3/uL (ref 145–400)
RBC: 4.32 10*6/uL (ref 3.70–5.45)
RDW: 13.3 % (ref 11.2–14.5)

## 2012-06-07 LAB — COMPREHENSIVE METABOLIC PANEL (CC13)
AST: 8 U/L (ref 5–34)
Alkaline Phosphatase: 54 U/L (ref 40–150)
Glucose: 100 mg/dl — ABNORMAL HIGH (ref 70–99)
Potassium: 4.1 mEq/L (ref 3.5–5.1)
Sodium: 139 mEq/L (ref 136–145)
Total Bilirubin: 0.3 mg/dL (ref 0.20–1.20)
Total Protein: 6.5 g/dL (ref 6.4–8.3)

## 2012-06-07 NOTE — Progress Notes (Signed)
Pt is an 78 year old female with a Dx of Esophageal Ca.  Pt will begin XRT 06/10/12 and chemo 06/14/12.    PMH include Dysphagia, COPD, and HTN  Meds include Alprazolam, Vit D, Celexa, Vit B12, Decadron, Toprol, Anaprox, Prilosec, and Compazine.  Labs reviewed.  Ht 61" Wt 121.3# UBW 116# BMI 22.86  Pt denies wt loss or difficulty swallowing.  She reports good appetite and po intake, wt is up from UBW.  Pt's daughters are present and very supportive.  Pt eats small frequent meals and drinks 1 Ensure/day.  Pt states she has never been a big eater.    Nutrition Diagnosis:  Predicted suboptimal energy intake related to new diagnosis of esophageal Cancer and associated treatments as evidenced by a history or presence of this condition for which research shows an increased incident of suboptimal energy intake.   Intervention:  Educated pt and pt's daughters on the importance of adequate kcal and protein intake for healing and wt maintenance.  Encouraged small frequent high kcal/high protein meals and snacks.  Provided list of foods to eat and foods to avoid if throat becomes sore. Recommend pt continue Ensure and switch to plus, may increase to 2-3 can/day if eating becomes difficult.  Pt seemed to have a good understanding of the information and stated she was willing to follow suggestions.  Provided samples of Boost plus, Ensure plus, and Ensure complete, with coupons.   Monitoring, evaluation, goals:  Pt will tolerate oral intake to minimize wt loss throughout treatment.   Next visit: To be scheduled.

## 2012-06-07 NOTE — Telephone Encounter (Signed)
Pt came by and gave her calendar for December and January 2014

## 2012-06-10 ENCOUNTER — Ambulatory Visit: Payer: Medicare Other

## 2012-06-10 ENCOUNTER — Ambulatory Visit
Admission: RE | Admit: 2012-06-10 | Discharge: 2012-06-10 | Disposition: A | Discharge status: Home / Self Care | Payer: Medicare Other | Source: Ambulatory Visit | Attending: Radiation Oncology | Admitting: Radiation Oncology

## 2012-06-11 ENCOUNTER — Ambulatory Visit
Admission: RE | Admit: 2012-06-11 | Payer: Medicare Other | Source: Ambulatory Visit | Attending: Radiation Oncology | Admitting: Radiation Oncology

## 2012-06-11 ENCOUNTER — Ambulatory Visit: Payer: Medicare Other

## 2012-06-11 ENCOUNTER — Ambulatory Visit
Admission: RE | Admit: 2012-06-11 | Discharge: 2012-06-11 | Disposition: A | Discharge status: Home / Self Care | Payer: Medicare Other | Source: Ambulatory Visit | Attending: Radiation Oncology | Admitting: Radiation Oncology

## 2012-06-13 ENCOUNTER — Other Ambulatory Visit: Payer: Self-pay | Admitting: Oncology

## 2012-06-13 ENCOUNTER — Ambulatory Visit
Admission: RE | Admit: 2012-06-13 | Discharge: 2012-06-13 | Disposition: A | Discharge status: Home / Self Care | Payer: Medicare Other | Source: Ambulatory Visit | Attending: Radiation Oncology | Admitting: Radiation Oncology

## 2012-06-13 ENCOUNTER — Ambulatory Visit: Payer: Medicare Other

## 2012-06-14 ENCOUNTER — Ambulatory Visit (HOSPITAL_BASED_OUTPATIENT_CLINIC_OR_DEPARTMENT_OTHER): Payer: Medicare Other

## 2012-06-14 ENCOUNTER — Encounter: Payer: Self-pay | Admitting: Radiation Oncology

## 2012-06-14 ENCOUNTER — Ambulatory Visit
Admission: RE | Admit: 2012-06-14 | Discharge: 2012-06-14 | Disposition: A | Discharge status: Home / Self Care | Payer: Medicare Other | Source: Ambulatory Visit | Attending: Radiation Oncology | Admitting: Radiation Oncology

## 2012-06-14 ENCOUNTER — Ambulatory Visit: Payer: Medicare Other

## 2012-06-14 VITALS — BP 145/71 | HR 66 | Resp 16 | Wt 117.2 lb

## 2012-06-14 VITALS — BP 141/72 | HR 55 | Temp 97.1°F | Resp 20

## 2012-06-14 DIAGNOSIS — C159 Malignant neoplasm of esophagus, unspecified: Secondary | ICD-10-CM

## 2012-06-14 DIAGNOSIS — Z5111 Encounter for antineoplastic chemotherapy: Secondary | ICD-10-CM

## 2012-06-14 DIAGNOSIS — C154 Malignant neoplasm of middle third of esophagus: Secondary | ICD-10-CM

## 2012-06-14 MED ORDER — DIPHENHYDRAMINE HCL 50 MG/ML IJ SOLN
25.0000 mg | Freq: Once | INTRAMUSCULAR | Status: AC
  Administered 2012-06-14: 25 mg via INTRAVENOUS

## 2012-06-14 MED ORDER — FAMOTIDINE IN NACL 20-0.9 MG/50ML-% IV SOLN
20.0000 mg | Freq: Once | INTRAVENOUS | Status: AC
Start: 2012-06-14 — End: 2012-06-14
  Administered 2012-06-14: 20 mg via INTRAVENOUS

## 2012-06-14 MED ORDER — DEXAMETHASONE SODIUM PHOSPHATE 10 MG/ML IJ SOLN
20.0000 mg | Freq: Once | INTRAMUSCULAR | Status: AC
  Administered 2012-06-14: 20 mg via INTRAVENOUS

## 2012-06-14 MED ORDER — ONDANSETRON 16 MG/50ML IVPB (CHCC)
16.0000 mg | Freq: Once | INTRAVENOUS | Status: AC
Start: 2012-06-14 — End: 2012-06-14
  Administered 2012-06-14: 16 mg via INTRAVENOUS

## 2012-06-14 MED ORDER — CARBOPLATIN CHEMO INJECTION 450 MG/45ML
135.0000 mg | Freq: Once | INTRAVENOUS | Status: AC
  Administered 2012-06-14: 140 mg via INTRAVENOUS
  Filled 2012-06-14: qty 14

## 2012-06-14 MED ORDER — PACLITAXEL CHEMO INJECTION 300 MG/50ML
50.0000 mg/m2 | Freq: Once | INTRAVENOUS | Status: AC
  Administered 2012-06-14: 78 mg via INTRAVENOUS
  Filled 2012-06-14: qty 13

## 2012-06-14 MED ORDER — SODIUM CHLORIDE 0.9 % IV SOLN
Freq: Once | INTRAVENOUS | Status: AC
  Administered 2012-06-14: 13:00:00 via INTRAVENOUS

## 2012-06-14 NOTE — Patient Instructions (Addendum)
[facility] Discharge Instructions for Patients Receiving Chemotherapy  Today you received the following chemotherapy agents taxol and carboplatin  To help prevent nausea and vomiting after your treatment, we encourage you to take your nausea medication compazine Begin taking it tonight and take it as often as prescribed for the next  48 hours. As needed.   If you develop nausea and vomiting that is not controlled by your nausea medication, call the clinic. If it is after clinic hours your family physician or the after hours number for the clinic or go to the Emergency Department.   BELOW ARE SYMPTOMS THAT SHOULD BE REPORTED IMMEDIATELY:  *FEVER GREATER THAN 100.5 F  *CHILLS WITH OR WITHOUT FEVER  NAUSEA AND VOMITING THAT IS NOT CONTROLLED WITH YOUR NAUSEA MEDICATION  *UNUSUAL SHORTNESS OF BREATH  *UNUSUAL BRUISING OR BLEEDING  TENDERNESS IN MOUTH AND THROAT WITH OR WITHOUT PRESENCE OF ULCERS  *URINARY PROBLEMS  *BOWEL PROBLEMS  UNUSUAL RASH Items with * indicate a potential emergency and should be followed up as soon as possible.  One of the nurses will contact you Monday. Please let the nurse know about any problems that you may have experienced. Feel free to call the clinic you have any questions or concerns. The clinic phone number is [phone].   I have been informed and understand all the instructions given to me. I know to contact the clinic, my physician, or go to the Emergency Department if any problems should occur. I do not have any questions at this time, but understand that I may call the clinic during office hours   should I have any questions or need assistance in obtaining follow up care.    __________________________________________  _____________  __________ Signature of Patient or Authorized Representative            Date                   Time    __________________________________________ Nurse's Signature

## 2012-06-14 NOTE — Progress Notes (Signed)
  Radiation Oncology         [phone] ________________________________  Name: Jamie [name] MRN: [id number]  Date: 06/14/2012  DOB: 1929/05/26  Weekly Radiation Therapy Management  Current Dose: 7.2 Gy     Planned Dose:  50.4 Gy  Narrative . . . . . . . . The patient presents for routine under treatment assessment.                                                   Patient presents to the clinic today accompanied by her daughter for PUT visit. Patient alert and oriented to person, place, and time. No distress noted. Steady gait noted. Pleasant affect noted. Patient denies pain at this time. Patient denies painful or difficulty swallowing. Patient reports an occasional productive cough with white sputum. Patient denies hemoptysis. Patient denies nausea, vomiting, headache or dizziness. Patient scheduled for chemotherapy today after this appointment                                 Set-up films were reviewed.                                 The chart was checked. Physical Findings. . .  weight is 117 lb 3.2 oz (53.162 kg). Her blood pressure is 145/71 and her pulse is 66. Her respiration is 16. . Weight essentially stable.  No significant changes. Impression . . . . . . . The patient is  tolerating radiation. Plan . . . . . . . . . . . . Continue treatment as planned.  ________________________________  Artist Pais. Kathrynn Running, M.D.

## 2012-06-14 NOTE — Progress Notes (Signed)
Patient presents to the clinic today accompanied by her daughter for PUT with Dr. Kathrynn Running. Patient alert and oriented to person, place, and time. No distress noted. Steady gait noted. Pleasant affect noted. Patient denies pain at this time. Patient denies painful or difficulty swallowing. Patient reports an occasional productive cough with white sputum. Patient denies hemoptysis. Patient denies nausea, vomiting, headache or dizziness. Reported all findings to Dr. Kathrynn Running. Patient scheduled for chemotherapy today after this appointment.

## 2012-06-17 ENCOUNTER — Telehealth: Payer: Self-pay | Admitting: *Deleted

## 2012-06-17 ENCOUNTER — Ambulatory Visit: Payer: Medicare Other

## 2012-06-17 ENCOUNTER — Ambulatory Visit
Admission: RE | Admit: 2012-06-17 | Discharge: 2012-06-17 | Disposition: A | Discharge status: Home / Self Care | Payer: Medicare Other | Source: Ambulatory Visit | Attending: Radiation Oncology | Admitting: Radiation Oncology

## 2012-06-17 ENCOUNTER — Other Ambulatory Visit: Payer: Self-pay | Admitting: Certified Registered Nurse Anesthetist

## 2012-06-17 DIAGNOSIS — C159 Malignant neoplasm of esophagus, unspecified: Secondary | ICD-10-CM

## 2012-06-17 NOTE — Telephone Encounter (Signed)
INSTRUCTED PT. VIA HOME VOICE MAIL TO CALL THIS OFFICE OR THE ON CALL PHYSICIAN WITH ANY QUESTIONS OR PROBLEMS.

## 2012-06-17 NOTE — Progress Notes (Signed)
Patient presented to the clinic today accompanied by her son for post sim education with Sam, Charity fundraiser. Patient alert and oriented to person, place, and time. No distress noted. Steady gait noted. Pleasant affect noted. Patient denies pain at this time. Provided patient with RADIATION THERAPY AND YOU handbook, esophagitis/mucositis handout, and difficulty swallowing handout then, reviewed pertinent information. Educated patient on potential side effects and management such as, esophagitis, mucositis, and dry mouth. Provided patient with biafine cream and directed up use. All questions answered. Patient verbalized understanding of all things reviewed. Encouraged patient to contact staff with needs. Provided patient with this writer's business card. Patient verbalized understanding of all things reviewed.

## 2012-06-18 ENCOUNTER — Ambulatory Visit: Payer: Medicare Other

## 2012-06-18 ENCOUNTER — Ambulatory Visit
Admission: RE | Admit: 2012-06-18 | Discharge: 2012-06-18 | Disposition: A | Discharge status: Home / Self Care | Payer: Medicare Other | Source: Ambulatory Visit | Attending: Radiation Oncology | Admitting: Radiation Oncology

## 2012-06-18 MED ORDER — BIAFINE EX EMUL
Freq: Every day | CUTANEOUS | Status: AC
  Administered 2012-06-18: 09:00:00 via TOPICAL

## 2012-06-18 NOTE — Addendum Note (Signed)
Encounter addended by: Agnes Lawrence, RN on: 06/18/2012  9:30 AM<BR>     Documentation filed: Inpatient MAR, Orders

## 2012-06-19 ENCOUNTER — Ambulatory Visit
Admission: RE | Admit: 2012-06-19 | Discharge: 2012-06-19 | Disposition: A | Discharge status: Home / Self Care | Payer: Medicare Other | Source: Ambulatory Visit | Attending: Radiation Oncology | Admitting: Radiation Oncology

## 2012-06-19 ENCOUNTER — Ambulatory Visit: Payer: Medicare Other

## 2012-06-20 ENCOUNTER — Ambulatory Visit: Payer: Medicare Other

## 2012-06-20 ENCOUNTER — Other Ambulatory Visit: Payer: Self-pay | Admitting: Oncology

## 2012-06-20 ENCOUNTER — Ambulatory Visit
Admission: RE | Admit: 2012-06-20 | Discharge: 2012-06-20 | Disposition: A | Discharge status: Home / Self Care | Payer: Medicare Other | Source: Ambulatory Visit | Attending: Radiation Oncology | Admitting: Radiation Oncology

## 2012-06-21 ENCOUNTER — Other Ambulatory Visit (HOSPITAL_BASED_OUTPATIENT_CLINIC_OR_DEPARTMENT_OTHER): Payer: Medicare Other

## 2012-06-21 ENCOUNTER — Ambulatory Visit
Admission: RE | Admit: 2012-06-21 | Discharge: 2012-06-21 | Disposition: A | Discharge status: Home / Self Care | Payer: Medicare Other | Source: Ambulatory Visit | Attending: Radiation Oncology | Admitting: Radiation Oncology

## 2012-06-21 ENCOUNTER — Encounter: Payer: Self-pay | Admitting: Radiation Oncology

## 2012-06-21 ENCOUNTER — Ambulatory Visit (HOSPITAL_BASED_OUTPATIENT_CLINIC_OR_DEPARTMENT_OTHER): Payer: Medicare Other | Admitting: Oncology

## 2012-06-21 ENCOUNTER — Ambulatory Visit (HOSPITAL_BASED_OUTPATIENT_CLINIC_OR_DEPARTMENT_OTHER): Payer: Medicare Other

## 2012-06-21 ENCOUNTER — Ambulatory Visit: Payer: Medicare Other

## 2012-06-21 ENCOUNTER — Telehealth: Payer: Self-pay | Admitting: Oncology

## 2012-06-21 ENCOUNTER — Ambulatory Visit: Payer: Medicare Other | Admitting: Nutrition

## 2012-06-21 ENCOUNTER — Telehealth: Payer: Self-pay | Admitting: *Deleted

## 2012-06-21 VITALS — BP 123/62 | HR 75 | Temp 97.3°F | Resp 20 | Ht 61.0 in | Wt 120.0 lb

## 2012-06-21 VITALS — BP 168/73 | HR 75 | Resp 18 | Wt 122.2 lb

## 2012-06-21 DIAGNOSIS — C154 Malignant neoplasm of middle third of esophagus: Secondary | ICD-10-CM

## 2012-06-21 DIAGNOSIS — Z5111 Encounter for antineoplastic chemotherapy: Secondary | ICD-10-CM

## 2012-06-21 DIAGNOSIS — G4700 Insomnia, unspecified: Secondary | ICD-10-CM

## 2012-06-21 DIAGNOSIS — C159 Malignant neoplasm of esophagus, unspecified: Secondary | ICD-10-CM

## 2012-06-21 DIAGNOSIS — R1310 Dysphagia, unspecified: Secondary | ICD-10-CM

## 2012-06-21 DIAGNOSIS — J449 Chronic obstructive pulmonary disease, unspecified: Secondary | ICD-10-CM

## 2012-06-21 LAB — CBC WITH DIFFERENTIAL/PLATELET
Basophils Absolute: 0.1 10*3/uL (ref 0.0–0.1)
EOS%: 2.3 % (ref 0.0–7.0)
Eosinophils Absolute: 0.1 10*3/uL (ref 0.0–0.5)
LYMPH%: 18.5 % (ref 14.0–49.7)
MCH: 31.5 pg (ref 25.1–34.0)
MCV: 93.3 fL (ref 79.5–101.0)
MONO%: 6 % (ref 0.0–14.0)
NEUT#: 4.3 10*3/uL (ref 1.5–6.5)
Platelets: 220 10*3/uL (ref 145–400)
RBC: 3.94 10*6/uL (ref 3.70–5.45)

## 2012-06-21 MED ORDER — SODIUM CHLORIDE 0.9 % IV SOLN
Freq: Once | INTRAVENOUS | Status: AC
  Administered 2012-06-21: 14:00:00 via INTRAVENOUS

## 2012-06-21 MED ORDER — FAMOTIDINE IN NACL 20-0.9 MG/50ML-% IV SOLN
20.0000 mg | Freq: Once | INTRAVENOUS | Status: AC
  Administered 2012-06-21: 20 mg via INTRAVENOUS

## 2012-06-21 MED ORDER — DIPHENHYDRAMINE HCL 50 MG/ML IJ SOLN
25.0000 mg | Freq: Once | INTRAMUSCULAR | Status: AC
  Administered 2012-06-21: 25 mg via INTRAVENOUS

## 2012-06-21 MED ORDER — SODIUM CHLORIDE 0.9 % IV SOLN
135.0000 mg | Freq: Once | INTRAVENOUS | Status: AC
  Administered 2012-06-21: 140 mg via INTRAVENOUS
  Filled 2012-06-21: qty 14

## 2012-06-21 MED ORDER — PACLITAXEL CHEMO INJECTION 300 MG/50ML
50.0000 mg/m2 | Freq: Once | INTRAVENOUS | Status: AC
  Administered 2012-06-21: 78 mg via INTRAVENOUS
  Filled 2012-06-21: qty 13

## 2012-06-21 MED ORDER — ONDANSETRON 16 MG/50ML IVPB (CHCC)
16.0000 mg | Freq: Once | INTRAVENOUS | Status: AC
  Administered 2012-06-21: 16 mg via INTRAVENOUS

## 2012-06-21 MED ORDER — DEXAMETHASONE SODIUM PHOSPHATE 10 MG/ML IJ SOLN
10.0000 mg | Freq: Once | INTRAMUSCULAR | Status: AC
  Administered 2012-06-21: 10 mg via INTRAVENOUS

## 2012-06-21 NOTE — Progress Notes (Signed)
I spoke with patient briefly during chemotherapy. Patient reports she is eating well. She denies nutrition side effects. Her weight has increased to 120 pounds on January 10 from 117.2 pounds January 3. Her weight is stable within this range.  Nutrition diagnosis: Predicted suboptimal energy intake continues.  Intervention: I have educated patient to continue strategies for increased oral intake to promote weight stabilization.  Monitoring, evaluation, goals: Patient will continue to tolerate oral diet to minimize weight loss throughout treatment.  Next visit: Friday, January 24, during chemotherapy.

## 2012-06-21 NOTE — Progress Notes (Signed)
   [facility]    OFFICE PROGRESS NOTE   INTERVAL HISTORY:   She returns as scheduled. She completed a first treatment with Taxol/carboplatin on 06/15/2011. She had "agitation "and insomnia on the evening following chemotherapy. No other apparent side effects. She continues daily radiation. No dysphagia.  Objective:  Vital signs in last 24 hours:  Blood pressure 123/62, pulse 75, temperature 97.3 F (36.3 C), temperature source Oral, resp. rate 20, height 5\' 1"  (1.549 m), weight 120 lb (54.432 kg).    HEENT: No thrush Resp: Lungs clear bilaterally Cardio: Regular rate and rhythm GI: No hepatomegaly, nontender Vascular: No leg edema   Lab Results:  Lab Results  Component Value Date   WBC 6.0 06/21/2012   HGB 12.4 06/21/2012   HCT 36.7 06/21/2012   MCV 93.3 06/21/2012   PLT 220 06/21/2012   ANC 4.3    Medications: I have reviewed the patient's current medications.  Assessment/Plan: 1.Squamous cell carcinoma of the midesophagus, staging CT evaluation on 05/20/2012 without evidence of metastatic disease . EUS on 06/03/2012 confirmed a uT3,uN0 lesion.                -she began daily radiation on 06/10/2012 and the first cycle of weekly Taxol/carboplatin was given on 06/15/2011 to 2. COPD  3. Ongoing tobacco use  4. Solid dysphagia secondary to the partially obstructing esophagus mass  5. Hypertension 6. Agitation/insomnia following chemotherapy 06/15/2011-likely related to Decadron. The Decadron dose will be reduced with this cycle.  Disposition:  She appears to be tolerating the chemotherapy and radiation well. The plan is to proceed with week 2 of Taxol/carboplatin today. She continues daily radiation. She will return for week 3 of chemotherapy on 06/29/2011. Jamie [name] is scheduled for an office visit on 07/06/2011.   Thornton Papas, MD  06/21/2012  1:40 PM

## 2012-06-21 NOTE — Progress Notes (Signed)
Patient presents to the clinic today accompanied by her daughter for PUT with Dr. Mitzi Hansen. Patient alert and oriented to person, place, and time. No distress noted. Steady gait noted. Pleasant affect noted. Patient denies pain at this time. Patient complains of occasional stabbing pain in left femur. Patient denies bruising or edema at left femur. Dr. Myrle Sheng evaluated patient's left femur and found no abnormalities. Patient reports dry cough continues. Patient denies painful swallowing. Patient reports shortness of breath only with exertion. Patient reports a good appetite. Patient denies skin changes in treatment filed. Patient reports using biafine cream bid in treatment field. Reported all findings to Dr. Mitzi Hansen.

## 2012-06-21 NOTE — Patient Instructions (Signed)
[facility] Discharge Instructions for Patients Receiving Chemotherapy  Today you received the following chemotherapy agents : Taxol & Carboplatin  To help prevent nausea and vomiting after your treatment, we encourage you to take your nausea medication:  Compazine 10 mg by mouth every 6 hours if needed for nausea Begin taking it at 1st sign of nausea and take it as often as prescribed as needed   If you develop nausea and vomiting that is not controlled by your nausea medication, call the clinic. If it is after clinic hours your family physician or the after hours number for the clinic or go to the Emergency Department.   BELOW ARE SYMPTOMS THAT SHOULD BE REPORTED IMMEDIATELY:  *FEVER GREATER THAN 100.5 F  *CHILLS WITH OR WITHOUT FEVER  NAUSEA AND VOMITING THAT IS NOT CONTROLLED WITH YOUR NAUSEA MEDICATION  *UNUSUAL SHORTNESS OF BREATH  *UNUSUAL BRUISING OR BLEEDING  TENDERNESS IN MOUTH AND THROAT WITH OR WITHOUT PRESENCE OF ULCERS  *URINARY PROBLEMS  *BOWEL PROBLEMS  UNUSUAL RASH Items with * indicate a potential emergency and should be followed up as soon as possible.   Feel free to call the clinic you have any questions or concerns. The clinic phone number is [phone].   I have been informed and understand all the instructions given to me. I know to contact the clinic, my physician, or go to the Emergency Department if any problems should occur. I do not have any questions at this time, but understand that I may call the clinic during office hours   should I have any questions or need assistance in obtaining follow up care.    __________________________________________  _____________  __________ Signature of Patient or Authorized Representative            Date                   Time    __________________________________________ Nurse's Signature

## 2012-06-21 NOTE — Telephone Encounter (Signed)
Per staff message and POF I have scheduled appts.  JMW  

## 2012-06-21 NOTE — Progress Notes (Signed)
   Department of Radiation Oncology  Phone:  [phone] Fax:        [phone]  Weekly Treatment Note    Name: Jamie [name] Date: 06/21/2012 MRN: [id number] DOB: 05-07-1929   Current dose: 16.2 Gy  Current fraction: 9   MEDICATIONS: Current Outpatient Prescriptions  Medication Sig Dispense Refill  . ALPRAZolam (XANAX) 0.5 MG tablet Take 0.5 mg by mouth daily as needed. ANXIETY      . aspirin 81 MG tablet Take 81 mg by mouth daily.      . Cholecalciferol (VITAMIN D-3) 1000 UNITS CAPS Take 1 capsule by mouth daily.      . citalopram (CELEXA) 20 MG tablet Take 20 mg by mouth every evening.       . Cyanocobalamin (VITAMIN B 12 PO) Take 1 tablet by mouth 2 (two) times daily.      Marland Kitchen emollient (BIAFINE) cream Apply topically as needed.      . metoprolol succinate (TOPROL-XL) 25 MG 24 hr tablet Take 25 mg by mouth daily with lunch.       . Misc Natural Products (OSTEO BI-FLEX ADV DOUBLE ST PO) Take 1 capsule by mouth 2 (two) times daily.       . naproxen sodium (ANAPROX) 220 MG tablet Take 220 mg by mouth as needed.      Marland Kitchen omeprazole (PRILOSEC) 40 MG capsule Take 1 capsule (40 mg total) by mouth daily.  30 capsule  11  . prochlorperazine (COMPAZINE) 5 MG tablet Take 1 tablet (5 mg total) by mouth every 6 (six) hours as needed for nausea.  60 tablet  2   No current facility-administered medications for this encounter.   Facility-Administered Medications Ordered in Other Encounters  Medication Dose Route Frequency Provider Last Rate Last Dose  . topical emolient (BIAFINE) emulsion   Topical Daily Jonna Coup, MD         ALLERGIES: Lipitor   LABORATORY DATA:  Lab Results  Component Value Date   WBC 6.0 06/21/2012   HGB 12.4 06/21/2012   HCT 36.7 06/21/2012   MCV 93.3 06/21/2012   PLT 220 06/21/2012   Lab Results  Component Value Date   NA 139 06/07/2012   K 4.1 06/07/2012   CL 101 06/07/2012   CO2 31* 06/07/2012   Lab Results  Component Value Date   ALT 11  06/07/2012   AST 8 06/07/2012   ALKPHOS 54 06/07/2012   BILITOT 0.30 06/07/2012     NARRATIVE: [place] was seen today for weekly treatment management. The chart was checked and the patient's films were reviewed. The patient is doing well with her treatment. She really describes no esophagitis at this time and states that she is able to swallow with out substantial difficulty.  PHYSICAL EXAMINATION: weight is 122 lb 3.2 oz (55.43 kg). Her blood pressure is 168/73 and her pulse is 75. Her respiration is 18.        ASSESSMENT: The patient is doing satisfactorily with treatment.  PLAN: We will continue with the patient's radiation treatment as planned.

## 2012-06-21 NOTE — Telephone Encounter (Signed)
gv and printed pt appt schedule for Jan and Feb....emialed michelle to change infusion for 1.24.14 to after est...the patient aware

## 2012-06-24 ENCOUNTER — Ambulatory Visit: Payer: Medicare Other

## 2012-06-24 ENCOUNTER — Ambulatory Visit
Admission: RE | Admit: 2012-06-24 | Discharge: 2012-06-24 | Disposition: A | Discharge status: Home / Self Care | Payer: Medicare Other | Source: Ambulatory Visit | Attending: Radiation Oncology | Admitting: Radiation Oncology

## 2012-06-25 ENCOUNTER — Ambulatory Visit
Admission: RE | Admit: 2012-06-25 | Discharge: 2012-06-25 | Disposition: A | Discharge status: Home / Self Care | Payer: Medicare Other | Source: Ambulatory Visit | Attending: Radiation Oncology | Admitting: Radiation Oncology

## 2012-06-25 ENCOUNTER — Ambulatory Visit: Payer: Medicare Other

## 2012-06-26 ENCOUNTER — Ambulatory Visit
Admission: RE | Admit: 2012-06-26 | Discharge: 2012-06-26 | Disposition: A | Discharge status: Home / Self Care | Payer: Medicare Other | Source: Ambulatory Visit | Attending: Radiation Oncology | Admitting: Radiation Oncology

## 2012-06-26 ENCOUNTER — Ambulatory Visit: Payer: Medicare Other

## 2012-06-27 ENCOUNTER — Ambulatory Visit
Admission: RE | Admit: 2012-06-27 | Discharge: 2012-06-27 | Disposition: A | Discharge status: Home / Self Care | Payer: Medicare Other | Source: Ambulatory Visit | Attending: Radiation Oncology | Admitting: Radiation Oncology

## 2012-06-27 ENCOUNTER — Other Ambulatory Visit: Payer: Self-pay | Admitting: Oncology

## 2012-06-27 ENCOUNTER — Other Ambulatory Visit: Payer: Self-pay | Admitting: *Deleted

## 2012-06-27 ENCOUNTER — Ambulatory Visit: Payer: Medicare Other

## 2012-06-28 ENCOUNTER — Ambulatory Visit
Admission: RE | Admit: 2012-06-28 | Discharge: 2012-06-28 | Disposition: A | Discharge status: Home / Self Care | Payer: Medicare Other | Source: Ambulatory Visit | Attending: Radiation Oncology | Admitting: Radiation Oncology

## 2012-06-28 ENCOUNTER — Ambulatory Visit (HOSPITAL_BASED_OUTPATIENT_CLINIC_OR_DEPARTMENT_OTHER): Payer: Medicare Other

## 2012-06-28 ENCOUNTER — Other Ambulatory Visit (HOSPITAL_BASED_OUTPATIENT_CLINIC_OR_DEPARTMENT_OTHER): Payer: Medicare Other | Admitting: Lab

## 2012-06-28 ENCOUNTER — Ambulatory Visit: Payer: Medicare Other

## 2012-06-28 ENCOUNTER — Encounter: Payer: Self-pay | Admitting: Radiation Oncology

## 2012-06-28 VITALS — BP 134/59 | HR 72 | Temp 99.0°F | Resp 20

## 2012-06-28 VITALS — BP 159/75 | HR 68 | Resp 16 | Wt 120.8 lb

## 2012-06-28 DIAGNOSIS — C159 Malignant neoplasm of esophagus, unspecified: Secondary | ICD-10-CM

## 2012-06-28 DIAGNOSIS — C154 Malignant neoplasm of middle third of esophagus: Secondary | ICD-10-CM

## 2012-06-28 DIAGNOSIS — Z5111 Encounter for antineoplastic chemotherapy: Secondary | ICD-10-CM

## 2012-06-28 LAB — CBC WITH DIFFERENTIAL/PLATELET
BASO%: 0.8 % (ref 0.0–2.0)
Basophils Absolute: 0 10*3/uL (ref 0.0–0.1)
EOS%: 1.3 % (ref 0.0–7.0)
Eosinophils Absolute: 0.1 10*3/uL (ref 0.0–0.5)
HCT: 35.6 % (ref 34.8–46.6)
HGB: 12 g/dL (ref 11.6–15.9)
LYMPH%: 22.8 % (ref 14.0–49.7)
MCH: 31.2 pg (ref 25.1–34.0)
MCHC: 33.7 g/dL (ref 31.5–36.0)
MCV: 92.5 fL (ref 79.5–101.0)
MONO#: 0.4 10*3/uL (ref 0.1–0.9)
MONO%: 10.2 % (ref 0.0–14.0)
NEUT#: 2.5 10*3/uL (ref 1.5–6.5)
NEUT%: 64.9 % (ref 38.4–76.8)
Platelets: 167 10*3/uL (ref 145–400)
RBC: 3.85 10*6/uL (ref 3.70–5.45)
RDW: 13.3 % (ref 11.2–14.5)
WBC: 3.8 10*3/uL — ABNORMAL LOW (ref 3.9–10.3)
lymph#: 0.9 10*3/uL (ref 0.9–3.3)

## 2012-06-28 MED ORDER — PACLITAXEL CHEMO INJECTION 300 MG/50ML
50.0000 mg/m2 | Freq: Once | INTRAVENOUS | Status: AC
  Administered 2012-06-28: 78 mg via INTRAVENOUS
  Filled 2012-06-28: qty 13

## 2012-06-28 MED ORDER — FAMOTIDINE IN NACL 20-0.9 MG/50ML-% IV SOLN
20.0000 mg | Freq: Once | INTRAVENOUS | Status: AC
Start: 2012-06-28 — End: 2012-06-28
  Administered 2012-06-28: 20 mg via INTRAVENOUS

## 2012-06-28 MED ORDER — SODIUM CHLORIDE 0.9 % IV SOLN
Freq: Once | INTRAVENOUS | Status: AC
  Administered 2012-06-28: 12:00:00 via INTRAVENOUS

## 2012-06-28 MED ORDER — SODIUM CHLORIDE 0.9 % IV SOLN
135.0000 mg | Freq: Once | INTRAVENOUS | Status: AC
  Administered 2012-06-28: 140 mg via INTRAVENOUS
  Filled 2012-06-28: qty 14

## 2012-06-28 MED ORDER — DEXAMETHASONE SODIUM PHOSPHATE 10 MG/ML IJ SOLN
10.0000 mg | Freq: Once | INTRAMUSCULAR | Status: AC
Start: 2012-06-28 — End: 2012-06-28
  Administered 2012-06-28: 10 mg via INTRAVENOUS

## 2012-06-28 MED ORDER — DIPHENHYDRAMINE HCL 50 MG/ML IJ SOLN
25.0000 mg | Freq: Once | INTRAMUSCULAR | Status: AC
  Administered 2012-06-28: 25 mg via INTRAVENOUS

## 2012-06-28 MED ORDER — ONDANSETRON 16 MG/50ML IVPB (CHCC)
16.0000 mg | Freq: Once | INTRAVENOUS | Status: AC
  Administered 2012-06-28: 16 mg via INTRAVENOUS

## 2012-06-28 NOTE — Progress Notes (Signed)
  Radiation Oncology         [phone] ________________________________  Name: Jamie [name] MRN: [id number]  Date: 06/28/2012  DOB: [birth year]  Weekly Radiation Therapy Management  Current Dose: 25.2 Gy     Planned Dose:  50.4 Gy  Narrative . . . . . . . . The patient presents for routine under treatment assessment.                                                Patient alert and oriented to person, place, and time. No distress noted. Steady gait noted. Pleasant affect noted. Patient denies pain at this time. Patient reports dry and itchy skin center of chest. Encouraged patient to apply hydrocortisone cream plus biafine bid to this area. Patient verbalized understanding. Blood pressure elevated. Patient denies taking metoprolol today. Patient reports difficulty swallowing at time. Patient denies pain associated with swallowing. Patient has not been prescribed carafate. Patient reports a chronic cough with clear sputum. Patient denies hemoptysis. Patient reports dry mouth for which she uses biotene rinse and gum. Patient has lost only two pounds ssince 06/21/2012                                 Set-up films were reviewed.                                 The chart was checked. Physical Findings. . .  weight is 120 lb 12.8 oz (54.795 kg). Her blood pressure is 159/75 and her pulse is 68. Her respiration is 16. . Weight essentially stable.  No significant changes. Impression . . . . . . . The patient is  tolerating radiation. Plan . . . . . . . . . . . . Continue treatment as planned.  ________________________________  Artist Pais. Kathrynn Running, M.D.

## 2012-06-28 NOTE — Patient Instructions (Addendum)
[facility] Discharge Instructions for Patients Receiving Chemotherapy  Today you received the following chemotherapy agents :  Taxol,  Carboplatin.  To help prevent nausea and vomiting after your treatment, we encourage you to take your nausea medication as instructed by your physician.    If you develop nausea and vomiting that is not controlled by your nausea medication, call the clinic. If it is after clinic hours your family physician or the after hours number for the clinic or go to the Emergency Department.   BELOW ARE SYMPTOMS THAT SHOULD BE REPORTED IMMEDIATELY:  *FEVER GREATER THAN 100.5 F  *CHILLS WITH OR WITHOUT FEVER  NAUSEA AND VOMITING THAT IS NOT CONTROLLED WITH YOUR NAUSEA MEDICATION  *UNUSUAL SHORTNESS OF BREATH  *UNUSUAL BRUISING OR BLEEDING  TENDERNESS IN MOUTH AND THROAT WITH OR WITHOUT PRESENCE OF ULCERS  *URINARY PROBLEMS  *BOWEL PROBLEMS  UNUSUAL RASH Items with * indicate a potential emergency and should be followed up as soon as possible.  One of the nurses will contact you 24 hours after your treatment. Please let the nurse know about any problems that you may have experienced. Feel free to call the clinic you have any questions or concerns. The clinic phone number is [phone].   I have been informed and understand all the instructions given to me. I know to contact the clinic, my physician, or go to the Emergency Department if any problems should occur. I do not have any questions at this time, but understand that I may call the clinic during office hours   should I have any questions or need assistance in obtaining follow up care.    __________________________________________  _____________  __________ Signature of Patient or Authorized Representative            Date                   Time    __________________________________________ Nurse's Signature    

## 2012-06-28 NOTE — Progress Notes (Signed)
Patient presents to the clinic today accompanied by her daughter for a PUT with Dr. Kathrynn Running. Patient alert and oriented to person, place, and time. No distress noted. Steady gait noted. Pleasant affect noted. Patient denies pain at this time. Patient reports dry and itchy skin center of chest. Encouraged patient to apply hydrocortisone cream plus biafine bid to this area. Patient verbalized understanding. Blood pressure elevated. Patient denies taking metoprolol today. Patient reports difficulty swallowing at time. Patient denies pain associated with swallowing. Patient has not been prescribed carafate. Patient reports a chronic cough with clear sputum. Patient denies hemoptysis. Patient reports dry mouth for which she uses biotene rinse and gum. Patient has lost only two pounds ssince 06/21/2012. Reported all findings to Dr. Kathrynn Running.

## 2012-06-30 ENCOUNTER — Other Ambulatory Visit: Payer: Self-pay | Admitting: Oncology

## 2012-07-01 ENCOUNTER — Ambulatory Visit: Payer: Medicare Other

## 2012-07-01 ENCOUNTER — Ambulatory Visit
Admission: RE | Admit: 2012-07-01 | Discharge: 2012-07-01 | Disposition: A | Discharge status: Home / Self Care | Payer: Medicare Other | Source: Ambulatory Visit | Attending: Radiation Oncology | Admitting: Radiation Oncology

## 2012-07-02 ENCOUNTER — Ambulatory Visit
Admission: RE | Admit: 2012-07-02 | Discharge: 2012-07-02 | Disposition: A | Discharge status: Home / Self Care | Payer: Medicare Other | Source: Ambulatory Visit | Attending: Radiation Oncology | Admitting: Radiation Oncology

## 2012-07-02 ENCOUNTER — Ambulatory Visit: Payer: Medicare Other

## 2012-07-03 ENCOUNTER — Ambulatory Visit
Admission: RE | Admit: 2012-07-03 | Discharge: 2012-07-03 | Disposition: A | Discharge status: Home / Self Care | Payer: Medicare Other | Source: Ambulatory Visit | Attending: Radiation Oncology | Admitting: Radiation Oncology

## 2012-07-03 ENCOUNTER — Ambulatory Visit: Payer: Medicare Other

## 2012-07-04 ENCOUNTER — Ambulatory Visit
Admission: RE | Admit: 2012-07-04 | Discharge: 2012-07-04 | Disposition: A | Discharge status: Home / Self Care | Payer: Medicare Other | Source: Ambulatory Visit | Attending: Radiation Oncology | Admitting: Radiation Oncology

## 2012-07-04 ENCOUNTER — Ambulatory Visit: Payer: Medicare Other

## 2012-07-05 ENCOUNTER — Ambulatory Visit (HOSPITAL_BASED_OUTPATIENT_CLINIC_OR_DEPARTMENT_OTHER): Payer: Medicare Other | Admitting: Oncology

## 2012-07-05 ENCOUNTER — Encounter: Payer: Self-pay | Admitting: Nutrition

## 2012-07-05 ENCOUNTER — Ambulatory Visit: Payer: Medicare Other

## 2012-07-05 ENCOUNTER — Ambulatory Visit (HOSPITAL_BASED_OUTPATIENT_CLINIC_OR_DEPARTMENT_OTHER): Payer: Medicare Other

## 2012-07-05 ENCOUNTER — Telehealth: Payer: Self-pay | Admitting: *Deleted

## 2012-07-05 ENCOUNTER — Other Ambulatory Visit (HOSPITAL_BASED_OUTPATIENT_CLINIC_OR_DEPARTMENT_OTHER): Payer: Medicare Other

## 2012-07-05 ENCOUNTER — Telehealth: Payer: Self-pay | Admitting: Oncology

## 2012-07-05 ENCOUNTER — Ambulatory Visit
Admission: RE | Admit: 2012-07-05 | Discharge: 2012-07-05 | Disposition: A | Discharge status: Home / Self Care | Payer: Medicare Other | Source: Ambulatory Visit | Attending: Radiation Oncology | Admitting: Radiation Oncology

## 2012-07-05 VITALS — BP 168/89 | HR 65 | Temp 97.4°F | Resp 20 | Ht 61.0 in | Wt 118.2 lb

## 2012-07-05 VITALS — BP 145/62 | HR 65 | Temp 98.2°F | Wt 119.5 lb

## 2012-07-05 DIAGNOSIS — G4700 Insomnia, unspecified: Secondary | ICD-10-CM

## 2012-07-05 DIAGNOSIS — C154 Malignant neoplasm of middle third of esophagus: Secondary | ICD-10-CM

## 2012-07-05 DIAGNOSIS — F17200 Nicotine dependence, unspecified, uncomplicated: Secondary | ICD-10-CM

## 2012-07-05 DIAGNOSIS — J449 Chronic obstructive pulmonary disease, unspecified: Secondary | ICD-10-CM

## 2012-07-05 DIAGNOSIS — J4489 Other specified chronic obstructive pulmonary disease: Secondary | ICD-10-CM

## 2012-07-05 DIAGNOSIS — Z5111 Encounter for antineoplastic chemotherapy: Secondary | ICD-10-CM

## 2012-07-05 DIAGNOSIS — C159 Malignant neoplasm of esophagus, unspecified: Secondary | ICD-10-CM

## 2012-07-05 LAB — CBC WITH DIFFERENTIAL/PLATELET
Eosinophils Absolute: 0 10*3/uL (ref 0.0–0.5)
MONO#: 0.4 10*3/uL (ref 0.1–0.9)
NEUT#: 3 10*3/uL (ref 1.5–6.5)
Platelets: 186 10*3/uL (ref 145–400)
RBC: 4.15 10*6/uL (ref 3.70–5.45)
RDW: 13.6 % (ref 11.2–14.5)
WBC: 4.4 10*3/uL (ref 3.9–10.3)
lymph#: 0.9 10*3/uL (ref 0.9–3.3)

## 2012-07-05 LAB — BASIC METABOLIC PANEL (CC13)
CO2: 28 mEq/L (ref 22–29)
Chloride: 102 mEq/L (ref 98–107)
Glucose: 92 mg/dl (ref 70–99)
Potassium: 4 mEq/L (ref 3.5–5.1)
Sodium: 141 mEq/L (ref 136–145)

## 2012-07-05 MED ORDER — SODIUM CHLORIDE 0.9 % IV SOLN
Freq: Once | INTRAVENOUS | Status: AC
  Administered 2012-07-05: 14:00:00 via INTRAVENOUS

## 2012-07-05 MED ORDER — SUCRALFATE 1 G PO TABS: 1.0000 g | ORAL_TABLET | Freq: Four times a day (QID) | ORAL | Status: DC

## 2012-07-05 MED ORDER — PACLITAXEL CHEMO INJECTION 300 MG/50ML
50.0000 mg/m2 | Freq: Once | INTRAVENOUS | Status: AC
  Administered 2012-07-05: 78 mg via INTRAVENOUS
  Filled 2012-07-05: qty 13

## 2012-07-05 MED ORDER — ONDANSETRON 16 MG/50ML IVPB (CHCC)
16.0000 mg | Freq: Once | INTRAVENOUS | Status: AC
  Administered 2012-07-05: 16 mg via INTRAVENOUS

## 2012-07-05 MED ORDER — DIPHENHYDRAMINE HCL 50 MG/ML IJ SOLN
25.0000 mg | Freq: Once | INTRAMUSCULAR | Status: AC
  Administered 2012-07-05: 25 mg via INTRAVENOUS

## 2012-07-05 MED ORDER — FAMOTIDINE IN NACL 20-0.9 MG/50ML-% IV SOLN
20.0000 mg | Freq: Once | INTRAVENOUS | Status: AC
  Administered 2012-07-05: 20 mg via INTRAVENOUS

## 2012-07-05 MED ORDER — SODIUM CHLORIDE 0.9 % IV SOLN
135.0000 mg | Freq: Once | INTRAVENOUS | Status: AC
  Administered 2012-07-05: 140 mg via INTRAVENOUS
  Filled 2012-07-05: qty 14

## 2012-07-05 MED ORDER — DEXAMETHASONE SODIUM PHOSPHATE 10 MG/ML IJ SOLN
10.0000 mg | Freq: Once | INTRAMUSCULAR | Status: AC
  Administered 2012-07-05: 10 mg via INTRAVENOUS

## 2012-07-05 NOTE — Telephone Encounter (Signed)
Per staff message I have adjusted appts. JM W 

## 2012-07-05 NOTE — Patient Instructions (Signed)
[facility] Discharge Instructions for Patients Receiving Chemotherapy  Today you received the following chemotherapy agents: taxol, carboplatin  To help prevent nausea and vomiting after your treatment, we encourage you to take your nausea medication.  Take it as often as prescribed.     If you develop nausea and vomiting that is not controlled by your nausea medication, call the clinic. If it is after clinic hours your family physician or the after hours number for the clinic or go to the Emergency Department.   BELOW ARE SYMPTOMS THAT SHOULD BE REPORTED IMMEDIATELY:  *FEVER GREATER THAN 100.5 F  *CHILLS WITH OR WITHOUT FEVER  NAUSEA AND VOMITING THAT IS NOT CONTROLLED WITH YOUR NAUSEA MEDICATION  *UNUSUAL SHORTNESS OF BREATH  *UNUSUAL BRUISING OR BLEEDING  TENDERNESS IN MOUTH AND THROAT WITH OR WITHOUT PRESENCE OF ULCERS  *URINARY PROBLEMS  *BOWEL PROBLEMS  UNUSUAL RASH Items with * indicate a potential emergency and should be followed up as soon as possible.  Feel free to call the clinic you have any questions or concerns. The clinic phone number is [phone].   I have been informed and understand all the instructions given to me. I know to contact the clinic, my physician, or go to the Emergency Department if any problems should occur. I do not have any questions at this time, but understand that I may call the clinic during office hours   should I have any questions or need assistance in obtaining follow up care.    __________________________________________  _____________  __________ Signature of Patient or Authorized Representative            Date                   Time    __________________________________________ Nurse's Signature    

## 2012-07-05 NOTE — Progress Notes (Signed)
   Department of Radiation Oncology  Phone:  [phone] Fax:        [phone]  Weekly Treatment Note    Name: Jamie [name] Date: 07/05/2012 MRN: [id number] DOB: September 21, 1928   Current dose: 32.4 Gy  Current fraction: 18   MEDICATIONS: Current Outpatient Prescriptions  Medication Sig Dispense Refill  . ALPRAZolam (XANAX) 0.5 MG tablet Take 0.5 mg by mouth daily as needed. ANXIETY      . aspirin 81 MG tablet Take 81 mg by mouth daily.      . Cholecalciferol (VITAMIN D-3) 1000 UNITS CAPS Take 1 capsule by mouth daily.      . citalopram (CELEXA) 20 MG tablet Take 20 mg by mouth every evening.       . Cyanocobalamin (VITAMIN B 12 PO) Take 1 tablet by mouth 2 (two) times daily.      Marland Kitchen emollient (BIAFINE) cream Apply topically as needed.      . metoprolol succinate (TOPROL-XL) 25 MG 24 hr tablet Take 25 mg by mouth daily with lunch.       . Misc Natural Products (OSTEO BI-FLEX ADV DOUBLE ST PO) Take 1 capsule by mouth 2 (two) times daily.       . naproxen sodium (ANAPROX) 220 MG tablet Take 220 mg by mouth as needed.      Marland Kitchen omeprazole (PRILOSEC) 40 MG capsule Take 1 capsule (40 mg total) by mouth daily.  30 capsule  11  . prochlorperazine (COMPAZINE) 5 MG tablet Take 1 tablet (5 mg total) by mouth every 6 (six) hours as needed for nausea.  60 tablet  2   No current facility-administered medications for this encounter.   Facility-Administered Medications Ordered in Other Encounters  Medication Dose Route Frequency Provider Last Rate Last Dose  . topical emolient (BIAFINE) emulsion   Topical Daily Jonna Coup, MD         ALLERGIES: Lipitor   LABORATORY DATA:  Lab Results  Component Value Date   WBC 3.8* 06/28/2012   HGB 12.0 06/28/2012   HCT 35.6 06/28/2012   MCV 92.5 06/28/2012   PLT 167 06/28/2012   Lab Results  Component Value Date   NA 139 06/07/2012   K 4.1 06/07/2012   CL 101 06/07/2012   CO2 31* 06/07/2012   Lab Results  Component Value Date   ALT  11 06/07/2012   AST 8 06/07/2012   ALKPHOS 54 06/07/2012   BILITOT 0.30 06/07/2012     NARRATIVE: Jamie [name] was seen today for weekly treatment management. The chart was checked and the patient's films were reviewed. The patient is doing well overall. She is complaining of some increased esophagitis. She does continue to take Prilosec.  PHYSICAL EXAMINATION: weight is 119 lb 8 oz (54.205 kg). Her temperature is 98.2 F (36.8 C). Her blood pressure is 145/62 and her pulse is 65. Her oxygen saturation is 99%.        ASSESSMENT: The patient is doing satisfactorily with treatment.  PLAN: We will continue with the patient's radiation treatment as planned. I have given her a prescription for Carafate and she will continue taking Prilosec. Hopefully this will relieve her symptoms of esophagitis. She is to let us know if this symptom continues/worsens.

## 2012-07-05 NOTE — Progress Notes (Signed)
Here for weekly assessment of esophageal radiation.Patient has completed 18 of 28 treatments thus far.Has follicular rash and redness anterior and posteriorly.Main concern today is sore throat and difficulty swallowing.

## 2012-07-05 NOTE — Progress Notes (Signed)
   [facility]    OFFICE PROGRESS NOTE   INTERVAL HISTORY:   She returns as scheduled. She continues weekly Taxol/carbo plan chemotherapy and radiation. She has tolerated the chemotherapy well. No nausea, mouth sores, or neuropathy symptoms. She reports 0dynophagia beginning this week. She was prescribed Carafate by radiation oncology.  Objective:  Vital signs in last 24 hours:  Blood pressure 168/89, pulse 65, temperature 97.4 F (36.3 C), temperature source Oral, resp. rate 20, height 5\' 1"  (1.549 m), weight 118 lb 3.2 oz (53.615 kg).    HEENT: No thrush or ulcers Resp: Distant breath sounds, no respiratory distress Cardio: Regular rate and rhythm GI: No hepatomegaly, nontender Vascular: No leg edema  Skin: Radiation erythema at the central anterior and posterior chest     Lab Results:  Lab Results  Component Value Date   WBC 4.4 07/05/2012   HGB 12.9 07/05/2012   HCT 38.2 07/05/2012   MCV 92.0 07/05/2012   PLT 186 07/05/2012   ANC 3.0    Medications: I have reviewed the patient's current medications.  Assessment/Plan: 1.Squamous cell carcinoma of the midesophagus, staging CT evaluation on 05/20/2012 without evidence of metastatic disease . EUS on 06/03/2012 confirmed a uT3,uN0 lesion. -she began daily radiation on 06/10/2012 and the first cycle of weekly Taxol/carboplatin was given on 06/15/2011  2. COPD  3. Ongoing tobacco use  4. Solid dysphagia secondary to the partially obstructing esophagus mass  5. Hypertension  6. Agitation/insomnia following chemotherapy 06/15/2011-likely related to Decadron. The Decadron dose was reduced with week 2 of chemotherapy 7. 0dynophagia-she will begin a trial of Carafate  Disposition:  She continues to tolerate the combined modality therapy well. She will complete week 4 of Taxol/carboplatin today. She is scheduled to finish radiation 07/18/2012. She will return for a final cycle of chemotherapy on 07/12/2012. Ms.  Lobdell will be scheduled for an office visit on 07/18/2012. She and her daughter would like to discuss the indication for a surgical referral at the next office visit.   Thornton Papas, MD  07/05/2012  5:46 PM

## 2012-07-05 NOTE — Progress Notes (Signed)
Good blood return at end of treatment

## 2012-07-05 NOTE — Addendum Note (Signed)
Encounter addended by: Jonna Coup, MD on: 07/05/2012  2:06 PM<BR>     Documentation filed: Orders

## 2012-07-05 NOTE — Progress Notes (Signed)
Blood return checked prior to taxol,

## 2012-07-05 NOTE — Telephone Encounter (Signed)
gv and printed appt schedule for opt for Jan and Feb...emailed michelle to change tx...the patient aware

## 2012-07-08 ENCOUNTER — Other Ambulatory Visit: Payer: Self-pay | Admitting: Certified Registered Nurse Anesthetist

## 2012-07-08 ENCOUNTER — Ambulatory Visit: Payer: Medicare Other

## 2012-07-08 ENCOUNTER — Ambulatory Visit
Admission: RE | Admit: 2012-07-08 | Discharge: 2012-07-08 | Disposition: A | Discharge status: Home / Self Care | Payer: Medicare Other | Source: Ambulatory Visit | Attending: Radiation Oncology | Admitting: Radiation Oncology

## 2012-07-09 ENCOUNTER — Ambulatory Visit
Admission: RE | Admit: 2012-07-09 | Discharge: 2012-07-09 | Disposition: A | Discharge status: Home / Self Care | Payer: Medicare Other | Source: Ambulatory Visit | Attending: Radiation Oncology | Admitting: Radiation Oncology

## 2012-07-09 ENCOUNTER — Ambulatory Visit: Payer: Medicare Other

## 2012-07-10 ENCOUNTER — Ambulatory Visit: Payer: Medicare Other

## 2012-07-10 ENCOUNTER — Ambulatory Visit
Admission: RE | Admit: 2012-07-10 | Discharge: 2012-07-10 | Disposition: A | Discharge status: Home / Self Care | Payer: Medicare Other | Source: Ambulatory Visit | Attending: Radiation Oncology | Admitting: Radiation Oncology

## 2012-07-10 ENCOUNTER — Other Ambulatory Visit: Payer: Self-pay | Admitting: Oncology

## 2012-07-10 NOTE — Progress Notes (Unsigned)
LATE ENTRY FOR INFUSION TIME ON 06/14/12.  Taxol started at 1435 hr and ended at 16 25hr. Carbo began at 1629 hrs x . See MAR.

## 2012-07-11 ENCOUNTER — Ambulatory Visit: Payer: Medicare Other

## 2012-07-11 ENCOUNTER — Ambulatory Visit
Admission: RE | Admit: 2012-07-11 | Discharge: 2012-07-11 | Disposition: A | Discharge status: Home / Self Care | Payer: Medicare Other | Source: Ambulatory Visit | Attending: Radiation Oncology | Admitting: Radiation Oncology

## 2012-07-12 ENCOUNTER — Ambulatory Visit
Admission: RE | Admit: 2012-07-12 | Discharge: 2012-07-12 | Disposition: A | Discharge status: Home / Self Care | Payer: Medicare Other | Source: Ambulatory Visit | Attending: Radiation Oncology | Admitting: Radiation Oncology

## 2012-07-12 ENCOUNTER — Ambulatory Visit (HOSPITAL_BASED_OUTPATIENT_CLINIC_OR_DEPARTMENT_OTHER): Payer: Medicare Other

## 2012-07-12 ENCOUNTER — Ambulatory Visit: Payer: Medicare Other

## 2012-07-12 ENCOUNTER — Other Ambulatory Visit (HOSPITAL_BASED_OUTPATIENT_CLINIC_OR_DEPARTMENT_OTHER): Payer: Medicare Other | Admitting: Lab

## 2012-07-12 VITALS — BP 158/87 | HR 72 | Temp 98.6°F | Resp 18

## 2012-07-12 VITALS — BP 126/62 | HR 69 | Temp 98.0°F | Wt 119.1 lb

## 2012-07-12 DIAGNOSIS — C154 Malignant neoplasm of middle third of esophagus: Secondary | ICD-10-CM

## 2012-07-12 DIAGNOSIS — C159 Malignant neoplasm of esophagus, unspecified: Secondary | ICD-10-CM

## 2012-07-12 DIAGNOSIS — Z5111 Encounter for antineoplastic chemotherapy: Secondary | ICD-10-CM

## 2012-07-12 LAB — CBC WITH DIFFERENTIAL/PLATELET
BASO%: 0.5 % (ref 0.0–2.0)
EOS%: 0.5 % (ref 0.0–7.0)
HCT: 35.2 % (ref 34.8–46.6)
LYMPH%: 18.5 % (ref 14.0–49.7)
MCH: 31 pg (ref 25.1–34.0)
MCHC: 33.5 g/dL (ref 31.5–36.0)
MCV: 92.4 fL (ref 79.5–101.0)
NEUT%: 71.9 % (ref 38.4–76.8)
Platelets: 151 10*3/uL (ref 145–400)

## 2012-07-12 MED ORDER — PACLITAXEL CHEMO INJECTION 300 MG/50ML
50.0000 mg/m2 | Freq: Once | INTRAVENOUS | Status: AC
  Administered 2012-07-12: 78 mg via INTRAVENOUS
  Filled 2012-07-12: qty 13

## 2012-07-12 MED ORDER — DIPHENHYDRAMINE HCL 50 MG/ML IJ SOLN
25.0000 mg | Freq: Once | INTRAMUSCULAR | Status: AC
  Administered 2012-07-12: 25 mg via INTRAVENOUS

## 2012-07-12 MED ORDER — SODIUM CHLORIDE 0.9 % IV SOLN
135.0000 mg | Freq: Once | INTRAVENOUS | Status: AC
  Administered 2012-07-12: 140 mg via INTRAVENOUS
  Filled 2012-07-12: qty 14

## 2012-07-12 MED ORDER — ONDANSETRON 16 MG/50ML IVPB (CHCC)
16.0000 mg | Freq: Once | INTRAVENOUS | Status: AC
  Administered 2012-07-12: 16 mg via INTRAVENOUS

## 2012-07-12 MED ORDER — SODIUM CHLORIDE 0.9 % IV SOLN
Freq: Once | INTRAVENOUS | Status: AC
  Administered 2012-07-12: 13:00:00 via INTRAVENOUS

## 2012-07-12 MED ORDER — FAMOTIDINE IN NACL 20-0.9 MG/50ML-% IV SOLN
20.0000 mg | Freq: Once | INTRAVENOUS | Status: AC
  Administered 2012-07-12: 20 mg via INTRAVENOUS

## 2012-07-12 MED ORDER — DEXAMETHASONE SODIUM PHOSPHATE 10 MG/ML IJ SOLN
5.0000 mg | Freq: Once | INTRAMUSCULAR | Status: AC
  Administered 2012-07-12: 5 mg via INTRAVENOUS

## 2012-07-12 NOTE — Progress Notes (Signed)
Patient here for routine assessment of esophageal radiation treatment.Throat discomfort has improved with carafate.Denies nausea.No difficulty swallowing.weight up almost 1 lb.Not much change in feeling tired.Has mild follicular rash , using biafine twice daily.

## 2012-07-12 NOTE — Patient Instructions (Addendum)
[facility] Discharge Instructions for Patients Receiving Chemotherapy  Today you received the following chemotherapy agents Taxol and Carboplatin.  To help prevent nausea and vomiting after your treatment, we encourage you to take your nausea medication as ordered per MD.    If you develop nausea and vomiting that is not controlled by your nausea medication, call the clinic. If it is after clinic hours your family physician or the after hours number for the clinic or go to the Emergency Department.   BELOW ARE SYMPTOMS THAT SHOULD BE REPORTED IMMEDIATELY:  *FEVER GREATER THAN 100.5 F  *CHILLS WITH OR WITHOUT FEVER  NAUSEA AND VOMITING THAT IS NOT CONTROLLED WITH YOUR NAUSEA MEDICATION  *UNUSUAL SHORTNESS OF BREATH  *UNUSUAL BRUISING OR BLEEDING  TENDERNESS IN MOUTH AND THROAT WITH OR WITHOUT PRESENCE OF ULCERS  *URINARY PROBLEMS  *BOWEL PROBLEMS  UNUSUAL RASH Items with * indicate a potential emergency and should be followed up as soon as possible.   Please let the nurse know about any problems that you may have experienced. Feel free to call the clinic you have any questions or concerns. The clinic phone number is [phone].   I have been informed and understand all the instructions given to me. I know to contact the clinic, my physician, or go to the Emergency Department if any problems should occur. I do not have any questions at this time, but understand that I may call the clinic during office hours   should I have any questions or need assistance in obtaining follow up care.

## 2012-07-12 NOTE — Progress Notes (Signed)
  Radiation Oncology         [phone] ________________________________  Name: Jamie [name] MRN: [id number]  Date: 05/29/2012  DOB: December 11, 1928  SIMULATION AND TREATMENT PLANNING NOTE   CONSENT VERIFIED: yes   SET UP: Patient is set-up supine   IMMOBILIZATION: The following immobilization is used: alpha-cradle. This complex treatment device will be used on a daily basis during the patient's treatment.   Diagnosis: Esophageal cancer   NARRATIVE: The patient was brought to the CT Simulation planning suite. Identity was confirmed. All relevant records and images related to the planned course of therapy were reviewed. Then, the patient was positioned in a stable reproducible clinical set-up for radiation therapy using an aquaplast mask. Skin markings were placed. The CT images were loaded into the planning software where the target and avoidance structures were contoured.The radiation prescription was entered and confirmed.   The patient will receive 50.4 Gy in 28 fractions.   Daily image guidance is ordered, and this will be used on a daily basis. This is necessary to ensure accurate and precise localization of the target in addition to accurate alignment of the normal tissue structures in this region.   Treatment planning then occurred.   I have requested : Intensity Modulated Radiotherapy (IMRT) is medically necessary for this case for the following reason: Dose homogeneity; the target is in close proximity to critical normal structures, including the heart, spinal cord,and the liver. IMRT is thus medically to appropriately treat the patient.   Special treatment procedure  The patient will receive chemotherapy during the course of radiation treatment. The patient may experience increased or overlapping toxicity due to this combined-modality approach and the patient will be monitored for such problems. This may include extra lab work as necessary. This therefore constitutes a  special treatment procedure.    ________________________________  Radene Gunning, MD, PhD

## 2012-07-12 NOTE — Progress Notes (Signed)
1255-Pt.'s daughter states that after pt.'s last chemo treatment that she became confused and irritated once she got home in the evening.  Pt.'s daughter asking if Benadryl dose needs to be decreased.  Dr. Truett Perna notified of pt.'s daughters concerns and order received to decrease Decadron dose to 5 mg for today's treatment.

## 2012-07-12 NOTE — Progress Notes (Signed)
   Department of Radiation Oncology  Phone:  [phone] Fax:        [phone]  Weekly Treatment Note    Name: Jamie [name] Date: 07/12/2012 MRN: [id number] DOB: 11-Mar-1929   Current dose: 41.4 Gy  Current fraction: 23   MEDICATIONS: Current Outpatient Prescriptions  Medication Sig Dispense Refill  . ALPRAZolam (XANAX) 0.5 MG tablet Take 0.5 mg by mouth daily as needed. ANXIETY      . aspirin 81 MG tablet Take 81 mg by mouth daily.      . Cholecalciferol (VITAMIN D-3) 1000 UNITS CAPS Take 1 capsule by mouth daily.      . citalopram (CELEXA) 20 MG tablet Take 20 mg by mouth every evening.       . Cyanocobalamin (VITAMIN B 12 PO) Take 1 tablet by mouth 2 (two) times daily.      Marland Kitchen emollient (BIAFINE) cream Apply topically as needed.      . metoprolol succinate (TOPROL-XL) 25 MG 24 hr tablet Take 25 mg by mouth daily with lunch.       . Misc Natural Products (OSTEO BI-FLEX ADV DOUBLE ST PO) Take 1 capsule by mouth 2 (two) times daily.       . naproxen sodium (ANAPROX) 220 MG tablet Take 220 mg by mouth as needed.      Marland Kitchen omeprazole (PRILOSEC) 40 MG capsule Take 1 capsule (40 mg total) by mouth daily.  30 capsule  11  . prochlorperazine (COMPAZINE) 5 MG tablet Take 1 tablet (5 mg total) by mouth every 6 (six) hours as needed for nausea.  60 tablet  2  . sucralfate (CARAFATE) 1 G tablet Take 1 tablet (1 g total) by mouth 4 (four) times daily. Dissolve in 15 cc water.  120 tablet  1   No current facility-administered medications for this encounter.   Facility-Administered Medications Ordered in Other Encounters  Medication Dose Route Frequency Provider Last Rate Last Dose  . topical emolient (BIAFINE) emulsion   Topical Daily Jonna Coup, MD         ALLERGIES: Lipitor   LABORATORY DATA:  Lab Results  Component Value Date   WBC 4.4 07/05/2012   HGB 12.9 07/05/2012   HCT 38.2 07/05/2012   MCV 92.0 07/05/2012   PLT 186 07/05/2012   Lab Results  Component Value  Date   NA 141 07/05/2012   K 4.0 07/05/2012   CL 102 07/05/2012   CO2 28 07/05/2012   Lab Results  Component Value Date   ALT 11 06/07/2012   AST 8 06/07/2012   ALKPHOS 54 06/07/2012   BILITOT 0.30 06/07/2012     NARRATIVE: Jamie [name] was seen today for weekly treatment management. The chart was checked and the patient's films were reviewed. The patient is doing well. Her throat discomfort has improved with Carafate. She denies nausea. Her weight has been stable or increased a little bit recently.  PHYSICAL EXAMINATION: weight is 119 lb 1.6 oz (54.023 kg). Her temperature is 98 F (36.7 C). Her blood pressure is 126/62 and her pulse is 69.        ASSESSMENT: The patient is doing satisfactorily with treatment.  PLAN: We will continue with the patient's radiation treatment as planned. She is nearing completion of her treatment. She was seen pretreatment today and she will receive her 24th treatment right now. She will finish next week therefore.

## 2012-07-15 ENCOUNTER — Ambulatory Visit: Payer: Medicare Other

## 2012-07-15 ENCOUNTER — Ambulatory Visit
Admission: RE | Admit: 2012-07-15 | Discharge: 2012-07-15 | Disposition: A | Discharge status: Home / Self Care | Payer: Medicare Other | Source: Ambulatory Visit | Attending: Radiation Oncology | Admitting: Radiation Oncology

## 2012-07-16 ENCOUNTER — Ambulatory Visit
Admission: RE | Admit: 2012-07-16 | Discharge: 2012-07-16 | Disposition: A | Discharge status: Home / Self Care | Payer: Medicare Other | Source: Ambulatory Visit | Attending: Radiation Oncology | Admitting: Radiation Oncology

## 2012-07-16 ENCOUNTER — Ambulatory Visit: Payer: Medicare Other

## 2012-07-17 ENCOUNTER — Ambulatory Visit
Admission: RE | Admit: 2012-07-17 | Discharge: 2012-07-17 | Disposition: A | Discharge status: Home / Self Care | Payer: Medicare Other | Source: Ambulatory Visit | Attending: Radiation Oncology | Admitting: Radiation Oncology

## 2012-07-18 ENCOUNTER — Encounter: Payer: Self-pay | Admitting: Radiation Oncology

## 2012-07-18 ENCOUNTER — Ambulatory Visit: Payer: Medicare Other

## 2012-07-18 ENCOUNTER — Ambulatory Visit
Admission: RE | Admit: 2012-07-18 | Discharge: 2012-07-18 | Disposition: A | Discharge status: Home / Self Care | Payer: Medicare Other | Source: Ambulatory Visit | Attending: Radiation Oncology | Admitting: Radiation Oncology

## 2012-07-18 ENCOUNTER — Telehealth: Payer: Self-pay | Admitting: Oncology

## 2012-07-18 ENCOUNTER — Telehealth: Payer: Self-pay | Admitting: Radiation Oncology

## 2012-07-18 ENCOUNTER — Other Ambulatory Visit (HOSPITAL_BASED_OUTPATIENT_CLINIC_OR_DEPARTMENT_OTHER): Payer: Medicare Other | Admitting: Lab

## 2012-07-18 ENCOUNTER — Ambulatory Visit (HOSPITAL_BASED_OUTPATIENT_CLINIC_OR_DEPARTMENT_OTHER): Payer: Medicare Other | Admitting: Nurse Practitioner

## 2012-07-18 VITALS — BP 132/59 | HR 69 | Temp 97.7°F | Resp 18 | Wt 118.5 lb

## 2012-07-18 VITALS — BP 150/76 | HR 76 | Temp 98.0°F | Resp 18 | Ht 61.0 in | Wt 118.4 lb

## 2012-07-18 DIAGNOSIS — I10 Essential (primary) hypertension: Secondary | ICD-10-CM

## 2012-07-18 DIAGNOSIS — C154 Malignant neoplasm of middle third of esophagus: Secondary | ICD-10-CM

## 2012-07-18 DIAGNOSIS — C159 Malignant neoplasm of esophagus, unspecified: Secondary | ICD-10-CM

## 2012-07-18 DIAGNOSIS — R1310 Dysphagia, unspecified: Secondary | ICD-10-CM

## 2012-07-18 DIAGNOSIS — J449 Chronic obstructive pulmonary disease, unspecified: Secondary | ICD-10-CM

## 2012-07-18 LAB — CBC WITH DIFFERENTIAL/PLATELET
Basophils Absolute: 0 10*3/uL (ref 0.0–0.1)
EOS%: 1.3 % (ref 0.0–7.0)
HCT: 33.6 % — ABNORMAL LOW (ref 34.8–46.6)
HGB: 11.4 g/dL — ABNORMAL LOW (ref 11.6–15.9)
MCH: 31.6 pg (ref 25.1–34.0)
NEUT#: 2.7 10*3/uL (ref 1.5–6.5)
NEUT%: 73.3 % (ref 38.4–76.8)
Platelets: 134 10*3/uL — ABNORMAL LOW (ref 145–400)
WBC: 3.7 10*3/uL — ABNORMAL LOW (ref 3.9–10.3)
lymph#: 0.7 10*3/uL — ABNORMAL LOW (ref 0.9–3.3)

## 2012-07-18 NOTE — Telephone Encounter (Signed)
Gave pt appt for ML visit on  March 2014

## 2012-07-18 NOTE — Telephone Encounter (Signed)
Pt was here today to get path and ib's for [facility] cancer policy.   AUTHORIZATION CONSENT FORM SIGNED   COPY SCANNED

## 2012-07-18 NOTE — Progress Notes (Signed)
Department of Radiation Oncology  Phone:  [phone] Fax:        [phone]  Weekly Treatment Note    Name: Michelle Nasuti Date: 07/18/2012 MRN: [id number] DOB: 31-Dec-1928   Current dose: 50.4 Gy  Current fraction: 28   MEDICATIONS: Current Outpatient Prescriptions  Medication Sig Dispense Refill  . ALPRAZolam (XANAX) 0.5 MG tablet Take 0.5 mg by mouth daily as needed. ANXIETY      . aspirin 81 MG tablet Take 81 mg by mouth daily.      . Cholecalciferol (VITAMIN D-3) 1000 UNITS CAPS Take 1 capsule by mouth daily.      . citalopram (CELEXA) 20 MG tablet Take 20 mg by mouth every evening.       . Cyanocobalamin (VITAMIN B 12 PO) Take 1 tablet by mouth 2 (two) times daily.      Marland Kitchen emollient (BIAFINE) cream Apply topically as needed.      . metoprolol succinate (TOPROL-XL) 25 MG 24 hr tablet Take 25 mg by mouth daily with lunch.       . Misc Natural Products (OSTEO BI-FLEX ADV DOUBLE ST PO) Take 1 capsule by mouth 2 (two) times daily.       . naproxen sodium (ANAPROX) 220 MG tablet Take 220 mg by mouth as needed.      Marland Kitchen omeprazole (PRILOSEC) 40 MG capsule Take 1 capsule (40 mg total) by mouth daily.  30 capsule  11  . prochlorperazine (COMPAZINE) 5 MG tablet Take 1 tablet (5 mg total) by mouth every 6 (six) hours as needed for nausea.  60 tablet  2  . sucralfate (CARAFATE) 1 G tablet Take 1 tablet (1 g total) by mouth 4 (four) times daily. Dissolve in 15 cc water.  120 tablet  1  . dexamethasone (DECADRON) 4 MG tablet        No current facility-administered medications for this encounter.   Facility-Administered Medications Ordered in Other Encounters  Medication Dose Route Frequency Provider Last Rate Last Dose  . topical emolient (BIAFINE) emulsion   Topical Daily Jonna Coup, MD         ALLERGIES: Lipitor   LABORATORY DATA:  Lab Results  Component Value Date   WBC 4.1 07/12/2012   HGB 11.8 07/12/2012   HCT 35.2 07/12/2012   MCV 92.4 07/12/2012   PLT 151  07/12/2012   Lab Results  Component Value Date   NA 141 07/05/2012   K 4.0 07/05/2012   CL 102 07/05/2012   CO2 28 07/05/2012   Lab Results  Component Value Date   ALT 11 06/07/2012   AST 8 06/07/2012   ALKPHOS 54 06/07/2012   BILITOT 0.30 06/07/2012     NARRATIVE: [place] A Lus[place] was seen today for weekly treatment management. The chart was checked and the patient's films were reviewed. The patient is doing well. She finished her final fraction today. No new complaints. She still complains of food occasionally getting caught in her throat.  PHYSICAL EXAMINATION: weight is 118 lb 8 oz (53.751 kg). Her oral temperature is 97.7 F (36.5 C). Her blood pressure is 132/59 and her pulse is 69. Her respiration is 18.        ASSESSMENT: The patient did satisfactorily with treatment.  PLAN:   Followup in one month. The patient's esophagitis should begin to improve now that she is finished treatment. She is to let us know if this worsens or she feels she needs additional management/medication for this.

## 2012-07-18 NOTE — Progress Notes (Signed)
Patient presents to the clinic today accompanied by her daughter for PUT with Dr. Mitzi Hansen following final treatment. Patient is alert and oriented to person, place, and time. No distress noted. Steady gait noted. Pleasant affect noted. Patient denies pain at this time. Patient reports a dry cough. Patient denies pain or difficulty swallowing. Patient reports using carafate. Patient reports that occasionally her food will get "caught in her throat." Patient denies hoarseness. Only faint hyperpigmentation of anterior neck without desquamation noted. Patient reports using biafine as directed. Patient has maintained the same weight since 07/05/2012. Reported all findings to Dr. Mitzi Hansen.

## 2012-07-18 NOTE — Progress Notes (Signed)
OFFICE PROGRESS NOTE  Interval history:  Jamie [name] returns as scheduled. She completed the final cycle of carboplatin/Taxol on 07/12/2012. She completed radiation earlier today. She has mild dysphagia. She continues Carafate. She is tolerating solids and liquids without difficulty. She denies nausea/vomiting. No mouth sores.   Objective: Blood pressure 150/76, pulse 76, temperature 98 F (36.7 C), temperature source Oral, resp. rate 18, height 5\' 1"  (1.549 m), weight 118 lb 6.4 oz (53.706 kg).  Oropharynx is without thrush or ulceration. Lungs are clear. Regular cardiac rhythm. Abdomen soft and nontender. No hepatomegaly. Extremities without edema. Radiation erythema at the posterior chest.  Lab Results: Lab Results  Component Value Date   WBC 3.7* 07/18/2012   HGB 11.4* 07/18/2012   HCT 33.6* 07/18/2012   MCV 92.7 07/18/2012   PLT 134* 07/18/2012    Chemistry:    Chemistry      Component Value Date/Time   NA 141 07/05/2012 1220   NA 140 06/01/2008 0931   K 4.0 07/05/2012 1220   K 4.2 06/01/2008 0931   CL 102 07/05/2012 1220   CL 104 06/01/2008 0931   CO2 28 07/05/2012 1220   BUN 12.8 07/05/2012 1220   BUN 8 05/20/2012 0905   CREATININE 0.8 07/05/2012 1220   CREATININE 0.8 05/20/2012 0905      Component Value Date/Time   CALCIUM 9.5 07/05/2012 1220   ALKPHOS 54 06/07/2012 1100   AST 8 06/07/2012 1100   ALT 11 06/07/2012 1100   BILITOT 0.30 06/07/2012 1100       Studies/Results: No results found.  Medications: I have reviewed the patient's current medications.  Assessment/Plan:  1.Squamous cell carcinoma of the midesophagus, staging CT evaluation on 05/20/2012 without evidence of metastatic disease . EUS on 06/03/2012 confirmed a uT3,uN0 lesion. -she began daily radiation on 06/10/2012 and the first cycle of weekly Taxol/carboplatin was given on 06/15/2011. The final cycle of weekly Taxol/carboplatin was given on 07/12/2012. She completed the course of radiation on 07/18/2012. 2.  COPD.   3. Ongoing tobacco use.  4. Solid dysphagia secondary to the partially obstructing esophagus mass.  5. Hypertension.  6. Agitation/insomnia following chemotherapy 06/15/2011-likely related to Decadron. The Decadron dose was reduced with week 2 of chemotherapy.  7. Odynophagia. She continues Carafate.  Disposition-Jamie [name] has completed concurrent chemotherapy/radiation therapy. Dr. Truett Perna discussed a surgical referral with Jamie [name] and her daughter. Ms. Zaucha declines the referral at present. She would like to discuss further at her next visit. She is agreeable to return in 4 weeks. She will contact the office in the interim with any problems.  Plan reviewed with Dr. Truett Perna.   Lonna Cobb ANP/GNP-BC

## 2012-07-19 ENCOUNTER — Ambulatory Visit: Payer: Medicare Other

## 2012-07-22 ENCOUNTER — Ambulatory Visit: Payer: Medicare Other

## 2012-08-12 NOTE — Progress Notes (Signed)
  Radiation Oncology         [phone] ________________________________  Name: Jamie [name] MRN: [id number]  Date: 07/18/2012  DOB: 12-Apr-1929  End of Treatment Note  Diagnosis:   Esophageal cancer     Indication for treatment:  Curative       Radiation treatment dates:   06/10/2012 through 07/18/2012  Site/dose:   The patient was treated to the gross tumor volume within the middle third of the esophagus including higher risk and adjacent regions. She was treated to 50.4 gray at 1.8 gray per fraction. This treatment was carried out on our tomotherapy unit with an IMRT technique and daily image guidance. The patient received concurrent chemotherapy during this treatment  Narrative: The patient tolerated radiation treatment relatively well.   The patient complained of some occasional dysphagia with food sticking in her throat. This was the primary complaint at the end of treatment.  Plan: The patient has completed radiation treatment. The patient will return to radiation oncology clinic for routine followup in one month. I advised the patient to call or return sooner if they have any questions or concerns related to their recovery or treatment. ________________________________  Radene Gunning, M.D., Ph.D.

## 2012-08-15 ENCOUNTER — Ambulatory Visit (HOSPITAL_BASED_OUTPATIENT_CLINIC_OR_DEPARTMENT_OTHER): Payer: Medicare Other | Admitting: Oncology

## 2012-08-15 ENCOUNTER — Ambulatory Visit
Admission: RE | Admit: 2012-08-15 | Discharge: 2012-08-15 | Disposition: A | Discharge status: Home / Self Care | Payer: Medicare Other | Source: Ambulatory Visit | Attending: Radiation Oncology | Admitting: Radiation Oncology

## 2012-08-15 ENCOUNTER — Telehealth: Payer: Self-pay | Admitting: Oncology

## 2012-08-15 VITALS — BP 149/64 | HR 73 | Temp 98.1°F | Wt 118.2 lb

## 2012-08-15 VITALS — BP 146/62 | HR 73 | Temp 98.1°F | Resp 18 | Ht 61.0 in | Wt 117.3 lb

## 2012-08-15 DIAGNOSIS — C154 Malignant neoplasm of middle third of esophagus: Secondary | ICD-10-CM

## 2012-08-15 DIAGNOSIS — C159 Malignant neoplasm of esophagus, unspecified: Secondary | ICD-10-CM

## 2012-08-15 DIAGNOSIS — F17200 Nicotine dependence, unspecified, uncomplicated: Secondary | ICD-10-CM

## 2012-08-15 NOTE — Progress Notes (Signed)
  Radiation Oncology         [phone]) [phone] ________________________________  Name: Jamie [name] MRN: [id number]  Date: 08/15/2012  DOB: 1929/02/20  Follow-Up Visit Note  CC: Jamie [name]., MD  Jamie Artist, MD  Diagnosis:   Was a carcinoma of the midesophagus  Interval Since Last Radiation:  One month   Narrative:  The patient returns today for routine follow-up.  The patient is doing well at this time she states. She really has no complaints and she is accompanied by her daughter today. She denies difficulty swallowing or odynophagia. She denies any chest discomfort and states that she breathes as she always has.    The patient is scheduled to see Dr. Truett [name] again today. They previously discussed possible surgery and the family wants to discuss this with him further.                            ALLERGIES:  is allergic to lipitor.  Meds: Current Outpatient Prescriptions  Medication Sig Dispense Refill  . ALPRAZolam (XANAX) 0.5 MG tablet Take 0.5 mg by mouth daily as needed. ANXIETY      . aspirin 81 MG tablet Take 81 mg by mouth daily.      . Cholecalciferol (VITAMIN D-3) 1000 UNITS CAPS Take 1 capsule by mouth daily.      . citalopram (CELEXA) 20 MG tablet Take 20 mg by mouth every evening.       Marland Kitchen emollient (BIAFINE) cream Apply topically as needed.      . metoprolol succinate (TOPROL-XL) 25 MG 24 hr tablet Take 25 mg by mouth daily with lunch.       . naproxen sodium (ANAPROX) 220 MG tablet Take 220 mg by mouth as needed.      Marland Kitchen omeprazole (PRILOSEC) 40 MG capsule Take 1 capsule (40 mg total) by mouth daily.  30 capsule  11   No current facility-administered medications for this encounter.   Facility-Administered Medications Ordered in Other Encounters  Medication Dose Route Frequency Provider Last Rate Last Dose  . topical emolient (BIAFINE) emulsion   Topical Daily Jonna Coup, MD        Physical Findings: The patient is in no acute distress. Patient is alert  and oriented.  weight is 118 lb 3.2 oz (53.615 kg). Her temperature is 98.1 F (36.7 C). Her blood pressure is 149/64 and her pulse is 73. Her oxygen saturation is 97%. .     Lab Findings: Lab Results  Component Value Date   WBC 3.7* 07/18/2012   HGB 11.4* 07/18/2012   HCT 33.6* 07/18/2012   MCV 92.7 07/18/2012   PLT 134* 07/18/2012     Radiographic Findings: No results found.  Impression:    The patient is doing very well. She has no ongoing local symptoms. She is replaced with how she is doing at this point.  Plan:  The patient will return to our clinic on a when necessary basis.   Radene Gunning, M.D., Ph.D.

## 2012-08-15 NOTE — Progress Notes (Signed)
   [facility]    OFFICE PROGRESS NOTE   INTERVAL HISTORY:   She returns as scheduled. She feels well. No solid or liquid dysphagia. No complaint.  Objective:  Vital signs in last 24 hours:  Blood pressure 146/62, pulse 73, temperature 98.1 F (36.7 C), temperature source Oral, resp. rate 18, height 5\' 1"  (1.549 m), weight 117 lb 4.8 oz (53.207 kg).    HEENT: No thrush or ulcers Lymphatics: No cervical or supraclavicular nodes Resp: Lungs clear bilaterally Cardio: Regular rate and rhythm GI: No hepatomegaly, nontender Vascular: No leg edema   Lab Results:  Lab Results  Component Value Date   WBC 3.7* 07/18/2012   HGB 11.4* 07/18/2012   HCT 33.6* 07/18/2012   MCV 92.7 07/18/2012   PLT 134* 07/18/2012   ANC 2.7    Medications: I have reviewed the patient's current medications.  Assessment/Plan: 1.Squamous cell carcinoma of the midesophagus, staging CT evaluation on 05/20/2012 without evidence of metastatic disease . EUS on 06/03/2012 confirmed a uT3,uN0 lesion. -she began daily radiation on 06/10/2012 and the first cycle of weekly Taxol/carboplatin was given on 06/15/2011. The final cycle of weekly Taxol/carboplatin was given on 07/12/2012. She completed the course of radiation on 07/18/2012.  2. COPD.  3. Ongoing tobacco use.  4. Solid dysphagia secondary to the partially obstructing esophagus mass. Resolved 5. Hypertension.  6. Agitation/insomnia following chemotherapy 06/15/2011-likely related to Decadron. The Decadron dose was reduced with week 2 of chemotherapy.  7. Odynophagia. Resolved  Disposition:  She appears asymptomatic from the esophagus cancer. Jamie [name] declines a surgical referral. We discussed treatment options if the cancer were to progress. She understands now is the time to consider curative surgery as opposed to at disease progression. She is most comfortable with an observation approach. Ms. Urieta will return for an office visit in 3  months. She will contact us in the interim for new symptoms.   Thornton Papas, MD  08/15/2012  4:50 PM

## 2012-08-15 NOTE — Progress Notes (Signed)
Patient here for routine one month follow up completion of esophageal radiation on 07/18/12.Denies pain.Appetite good, weight maintained with in a few lbs.Skin ok, continues to apply biafine.

## 2012-08-20 ENCOUNTER — Encounter: Payer: Self-pay | Admitting: Oncology

## 2012-08-20 NOTE — Progress Notes (Signed)
Put [employer]fl[employer] forms on nurse's desk.

## 2012-08-26 ENCOUNTER — Encounter: Payer: Self-pay | Admitting: Oncology

## 2012-08-26 NOTE — Progress Notes (Signed)
Put [employer] forms in registration desk.

## 2012-11-15 ENCOUNTER — Telehealth: Payer: Self-pay | Admitting: Oncology

## 2012-11-15 ENCOUNTER — Ambulatory Visit (HOSPITAL_BASED_OUTPATIENT_CLINIC_OR_DEPARTMENT_OTHER): Payer: Medicare Other | Admitting: Oncology

## 2012-11-15 VITALS — BP 150/64 | HR 83 | Temp 98.0°F | Resp 18 | Ht 61.0 in | Wt 118.6 lb

## 2012-11-15 DIAGNOSIS — G4700 Insomnia, unspecified: Secondary | ICD-10-CM

## 2012-11-15 DIAGNOSIS — C154 Malignant neoplasm of middle third of esophagus: Secondary | ICD-10-CM

## 2012-11-15 DIAGNOSIS — C159 Malignant neoplasm of esophagus, unspecified: Secondary | ICD-10-CM

## 2012-11-15 DIAGNOSIS — J449 Chronic obstructive pulmonary disease, unspecified: Secondary | ICD-10-CM

## 2012-11-15 DIAGNOSIS — F17200 Nicotine dependence, unspecified, uncomplicated: Secondary | ICD-10-CM

## 2012-11-15 NOTE — Progress Notes (Signed)
   [facility]    OFFICE PROGRESS NOTE   INTERVAL HISTORY:   [name] returns as scheduled. She feels well. No dysphagia. Her daughter reports that she sometimes becomes fatigued.  Objective:  Vital signs in last 24 hours:  Blood pressure 150/64, pulse 83, temperature 98 F (36.7 C), temperature source Oral, resp. rate 18, height 5\' 1"  (1.549 m), weight 118 lb 9.6 oz (53.797 kg), SpO2 95.00%.    HEENT: Neck without mass Lymphatics: No cervical or supraclavicular nodes. Prominent bilateral axillary fat pads. Resp: Lungs clear aside from end inspiratory rhonchi at the right base, no respiratory distress Cardio: Regular rate and rhythm GI: No hepatomegaly, nontender Vascular: No leg edema  Skin: Minimal changes of radiation hyperpigmentation over the back      Medications: I have reviewed the patient's current medications.  Assessment/Plan: 1.Squamous cell carcinoma of the midesophagus, staging CT evaluation on 05/20/2012 without evidence of metastatic disease . EUS on 06/03/2012 confirmed a uT3,uN0 lesion. -she began daily radiation on 06/10/2012 and the first cycle of weekly Taxol/carboplatin was given on 06/15/2011. The final cycle of weekly Taxol/carboplatin was given on 07/12/2012. She completed the course of radiation on 07/18/2012.  2. COPD.  3. Ongoing tobacco use.  4. Solid dysphagia secondary to the partially obstructing esophagus mass. Resolved  5. Hypertension.  6. Agitation/insomnia following chemotherapy 06/15/2011-likely related to Decadron. The Decadron dose was reduced with week 2 of chemotherapy.  7. Odynophagia. Resolved   Disposition:  [name] remains in clinical remission from esophagus cancer. She will return for an office visit in 4 months. She will contact us in the interim for dysphagia or new symptoms.   Thornton Papas, MD  11/15/2012  10:12 AM

## 2012-11-15 NOTE — Telephone Encounter (Signed)
gv and printed appt sched and avs for pt  °

## 2013-01-15 ENCOUNTER — Other Ambulatory Visit: Payer: Self-pay

## 2013-01-20 ENCOUNTER — Telehealth: Payer: Self-pay | Admitting: Oncology

## 2013-01-20 NOTE — Telephone Encounter (Signed)
s.w. pt and advised on 10.17.14 appt moved to 10.28.14.Marland KitchenMarland Kitchenpt ok and aware

## 2013-02-26 ENCOUNTER — Telehealth: Payer: Self-pay | Admitting: *Deleted

## 2013-02-26 NOTE — Telephone Encounter (Signed)
Call from pt's son requesting names and dates of chemo drugs given to pt. She is being scheduled for oral surgery with Dr. Warren Danes. Requests this to be faxed to Dr. Bubba Camp office. Same done.

## 2013-03-28 ENCOUNTER — Ambulatory Visit: Payer: Self-pay | Admitting: Oncology

## 2013-04-08 ENCOUNTER — Ambulatory Visit (HOSPITAL_BASED_OUTPATIENT_CLINIC_OR_DEPARTMENT_OTHER): Payer: Medicare Other | Admitting: Oncology

## 2013-04-08 VITALS — BP 149/57 | HR 69 | Temp 98.2°F | Resp 17 | Ht 61.0 in | Wt 121.2 lb

## 2013-04-08 DIAGNOSIS — C159 Malignant neoplasm of esophagus, unspecified: Secondary | ICD-10-CM

## 2013-04-08 DIAGNOSIS — C154 Malignant neoplasm of middle third of esophagus: Secondary | ICD-10-CM

## 2013-04-08 DIAGNOSIS — F17200 Nicotine dependence, unspecified, uncomplicated: Secondary | ICD-10-CM

## 2013-04-08 DIAGNOSIS — I10 Essential (primary) hypertension: Secondary | ICD-10-CM

## 2013-04-08 NOTE — Progress Notes (Signed)
   [facility]    OFFICE PROGRESS NOTE   INTERVAL HISTORY:   Jamie [name] returns as scheduled. No dysphagia. She continues to live alone. She smoked. No complaint aside from memory loss.  Objective:  Vital signs in last 24 hours:  Blood pressure 149/57, pulse 69, temperature 98.2 F (36.8 C), temperature source Oral, resp. rate 17, height 5\' 1"  (1.549 m), weight 121 lb 3.2 oz (54.976 kg), SpO2 96.00%.    HEENT: Neck without mass Lymphatics: No cervical or supraclavicular nodes. Prominent bilateral axillary fat pads Resp: Clear bilaterally with distant breath sounds, no respiratory distress Cardio: Regular rate and rhythm GI: No hepatomegaly Vascular: No leg edema   Medications: I have reviewed the patient's current medications.  Assessment/Plan: 1.Squamous cell carcinoma of the midesophagus, staging CT evaluation on 05/20/2012 without evidence of metastatic disease . EUS on 06/03/2012 confirmed a uT3,uN0 lesion. -she began daily radiation on 06/10/2012 and the first cycle of weekly Taxol/carboplatin was given on 06/15/2011. The final cycle of weekly Taxol/carboplatin was given on 07/12/2012. She completed the course of radiation on 07/18/2012.  2. COPD.  3. Ongoing tobacco use.  4. Solid dysphagia secondary to the partially obstructing esophagus mass. Resolved  5. Hypertension.  6. Agitation/insomnia following chemotherapy 06/15/2011-likely related to Decadron. The Decadron dose was reduced with week 2 of chemotherapy.  7. Odynophagia. Resolved    Disposition:  Jamie [name] remains in clinical remission from the esophagus cancer. She will return for an office visit in 4 months. She will contact us in the interim for new symptoms.   Thornton Papas, MD  04/08/2013  3:57 PM

## 2013-04-11 ENCOUNTER — Telehealth: Payer: Self-pay | Admitting: Oncology

## 2013-04-11 NOTE — Telephone Encounter (Signed)
s.w. pt and advised on Feb 2015...ok and aware

## 2013-08-01 ENCOUNTER — Telehealth: Payer: Self-pay | Admitting: Oncology

## 2013-08-01 ENCOUNTER — Ambulatory Visit (HOSPITAL_BASED_OUTPATIENT_CLINIC_OR_DEPARTMENT_OTHER): Payer: Medicare Other | Admitting: Oncology

## 2013-08-01 VITALS — BP 176/70 | HR 58 | Temp 97.7°F | Resp 17 | Ht 61.0 in | Wt 120.1 lb

## 2013-08-01 DIAGNOSIS — F17200 Nicotine dependence, unspecified, uncomplicated: Secondary | ICD-10-CM

## 2013-08-01 DIAGNOSIS — C154 Malignant neoplasm of middle third of esophagus: Secondary | ICD-10-CM

## 2013-08-01 DIAGNOSIS — J449 Chronic obstructive pulmonary disease, unspecified: Secondary | ICD-10-CM

## 2013-08-01 DIAGNOSIS — C159 Malignant neoplasm of esophagus, unspecified: Secondary | ICD-10-CM

## 2013-08-01 DIAGNOSIS — I10 Essential (primary) hypertension: Secondary | ICD-10-CM

## 2013-08-01 NOTE — Telephone Encounter (Signed)
s.w. pt and advised on Aug appt....pt ok and aware °

## 2013-08-01 NOTE — Progress Notes (Signed)
   [facility]    OFFICE PROGRESS NOTE   INTERVAL HISTORY:   [name] returns for scheduled followup of the esophagus cancer. She reports feeling well. No dysphagia. Good appetite. She continues to smoke. No complaint.  Objective:  Vital signs in last 24 hours:  Blood pressure 176/70, pulse 58, temperature 97.7 F (36.5 C), temperature source Oral, resp. rate 17, height 5\' 1"  (1.549 m), weight 120 lb 1.6 oz (54.477 kg), SpO2 98.00%.    HEENT: Neck without mass Lymphatics: No cervical, supraclavicular, or axillary nodes Resp: Lungs clear bilaterally Cardio: Regular rate and rhythm GI: No hepatomegaly, nontender, no mass Vascular: No leg edema   Medications: I have reviewed the patient's current medications.  Assessment/Plan: 1.Squamous cell carcinoma of the midesophagus, staging CT evaluation on 05/20/2012 without evidence of metastatic disease . EUS on 06/03/2012 confirmed a uT3,uN0 lesion. -she began daily radiation on 06/10/2012 and the first cycle of weekly Taxol/carboplatin was given on 06/15/2011. The final cycle of weekly Taxol/carboplatin was given on 07/12/2012. She completed the course of radiation on 07/18/2012.  2. COPD.  3. Ongoing tobacco use.  4. Solid dysphagia secondary to the partially obstructing esophagus mass. Resolved  5. Hypertension.  6. Agitation/insomnia following chemotherapy 06/15/2011-likely related to Decadron. The Decadron dose was reduced with week 2 of chemotherapy.  7. Odynophagia. Resolved    Disposition:  Jamie [name] remains in clinical remission from esophagus cancer. She will return for an office visit in 6 months. She will contact us in the interim for new symptoms.   Betsy Coder, MD  08/01/2013  1:04 PM

## 2014-01-19 ENCOUNTER — Encounter: Payer: Self-pay | Admitting: Gastroenterology

## 2014-01-21 ENCOUNTER — Telehealth: Payer: Self-pay | Admitting: Oncology

## 2014-01-21 NOTE — Telephone Encounter (Signed)
Pt's daughter Ginger cld to r/s due to surgery, no availability with [facility], r/s w/LT....Cherylann Banas

## 2014-01-29 ENCOUNTER — Ambulatory Visit: Payer: Self-pay | Admitting: Oncology

## 2014-02-03 ENCOUNTER — Telehealth: Payer: Self-pay | Admitting: Oncology

## 2014-02-03 ENCOUNTER — Ambulatory Visit (HOSPITAL_BASED_OUTPATIENT_CLINIC_OR_DEPARTMENT_OTHER): Payer: Medicare Other | Admitting: Nurse Practitioner

## 2014-02-03 VITALS — BP 148/76 | HR 62 | Temp 98.5°F | Resp 20 | Ht 61.0 in | Wt 114.9 lb

## 2014-02-03 DIAGNOSIS — F17200 Nicotine dependence, unspecified, uncomplicated: Secondary | ICD-10-CM

## 2014-02-03 DIAGNOSIS — I10 Essential (primary) hypertension: Secondary | ICD-10-CM

## 2014-02-03 DIAGNOSIS — R634 Abnormal weight loss: Secondary | ICD-10-CM

## 2014-02-03 DIAGNOSIS — C154 Malignant neoplasm of middle third of esophagus: Secondary | ICD-10-CM

## 2014-02-03 DIAGNOSIS — J449 Chronic obstructive pulmonary disease, unspecified: Secondary | ICD-10-CM

## 2014-02-03 NOTE — Progress Notes (Signed)
  [place] OFFICE PROGRESS NOTE   Diagnosis:  Esophagus cancer.  INTERVAL HISTORY:   Jamie [name] returns as scheduled. She feels well. She denies dysphagia and odynophagia. No nausea or vomiting. No constipation or diarrhea. No fevers or sweats. She denies shortness of breath and cough. She continues to smoke. She denies pain. Her appetite is stable.  Objective:  Vital signs in last 24 hours:  Blood pressure 148/76, pulse 62, temperature 98.5 F (36.9 C), temperature source Oral, resp. rate 20, height 5\' 1"  (1.549 m), weight 114 lb 14.4 oz (52.118 kg).    HEENT: No thrush or ulcers. Lymphatics: No palpable cervical, supraclavicular or axillary lymph nodes. Resp: Lungs clear bilaterally. Cardio: Regular rate and rhythm. GI: Abdomen soft and nontender. No hepatomegaly. No mass. Vascular: No leg edema.  Lab Results:  Lab Results  Component Value Date   WBC 3.7* 07/18/2012   HGB 11.4* 07/18/2012   HCT 33.6* 07/18/2012   MCV 92.7 07/18/2012   PLT 134* 07/18/2012   NEUTROABS 2.7 07/18/2012    Imaging:  No results found.  Medications: I have reviewed the patient's current medications.  Assessment/Plan: 1.Squamous cell carcinoma of the midesophagus, staging CT evaluation on 05/20/2012 without evidence of metastatic disease . EUS on 06/03/2012 confirmed a uT3,uN0 lesion. -she began daily radiation on 06/10/2012 and the first cycle of weekly Taxol/carboplatin was given on 06/15/2011. The final cycle of weekly Taxol/carboplatin was given on 07/12/2012. She completed the course of radiation on 07/18/2012.  2. COPD.  3. Ongoing tobacco use.  4. Solid dysphagia secondary to the partially obstructing esophagus mass. Resolved.  5. Hypertension.  6. Agitation/insomnia following chemotherapy 06/15/2011-likely related to Decadron. The Decadron dose was reduced with week 2 of chemotherapy.  7. Odynophagia. Resolved     Disposition: Jamie [name] remains in clinical remission from  esophagus cancer. She will return for an office visit in 6 months.  She has lost about 5 pounds over the past 6 months. Her daughter will monitor her weight and contact the office if it continues to decline.  Plan reviewed with Dr. Benay Spice.    Ned Card ANP/GNP-BC   02/03/2014  4:57 PM

## 2014-02-03 NOTE — Telephone Encounter (Signed)
Pt confirmed ov per 08/25 POF, gave pt AVS....KJ

## 2014-06-27 ENCOUNTER — Telehealth: Payer: Self-pay | Admitting: Oncology

## 2014-06-27 NOTE — Telephone Encounter (Signed)
s.w pt regarding appt d.t change due to MD on call....pt ok and aware °

## 2014-08-18 ENCOUNTER — Ambulatory Visit (HOSPITAL_BASED_OUTPATIENT_CLINIC_OR_DEPARTMENT_OTHER): Payer: Medicare Other | Admitting: Oncology

## 2014-08-18 ENCOUNTER — Telehealth: Payer: Self-pay | Admitting: Oncology

## 2014-08-18 VITALS — BP 160/58 | HR 62 | Temp 98.4°F | Resp 18 | Ht 61.0 in | Wt 123.5 lb

## 2014-08-18 DIAGNOSIS — C154 Malignant neoplasm of middle third of esophagus: Secondary | ICD-10-CM

## 2014-08-18 DIAGNOSIS — Z8501 Personal history of malignant neoplasm of esophagus: Secondary | ICD-10-CM

## 2014-08-18 DIAGNOSIS — J449 Chronic obstructive pulmonary disease, unspecified: Secondary | ICD-10-CM

## 2014-08-18 DIAGNOSIS — I10 Essential (primary) hypertension: Secondary | ICD-10-CM

## 2014-08-18 DIAGNOSIS — Z720 Tobacco use: Secondary | ICD-10-CM

## 2014-08-18 NOTE — Telephone Encounter (Signed)
Pt confirmed MD visit per 03/08 POF, gave pt AVS.. KJ  °

## 2014-08-18 NOTE — Progress Notes (Signed)
  [place] OFFICE PROGRESS NOTE   Diagnosis: Esophagus cancer  INTERVAL HISTORY:   Jamie [name] returns as scheduled. Good appetite. No dysphagia. She continues smoking. She lives alone at home and has a caretaker 4 hours per day. She is no longer driving.  Objective:  Vital signs in last 24 hours:  Blood pressure 160/58, pulse 62, temperature 98.4 F (36.9 C), temperature source Oral, resp. rate 18, height 5\' 1"  (1.549 m), weight 123 lb 8 oz (56.019 kg), SpO2 99 %.    HEENT: Neck without mass Lymphatics: No cervical, supraclavicular, or axillary nodes Resp: Lungs clear bilaterally Cardio: Regular rate and rhythm GI: No hepatosplenomegaly, nontender, no mass Vascular: No leg edema  Medications: I have reviewed the patient's current medications.  Assessment/Plan: 1.Squamous cell carcinoma of the midesophagus, staging CT evaluation on 05/20/2012 without evidence of metastatic disease . EUS on 06/03/2012 confirmed a uT3,uN0 lesion. -she began daily radiation on 06/10/2012 and the first cycle of weekly Taxol/carboplatin was given on 06/15/2011. The final cycle of weekly Taxol/carboplatin was given on 07/12/2012. She completed the course of radiation on 07/18/2012.  2. COPD.  3. Ongoing tobacco use.  4. Solid dysphagia secondary to the partially obstructing esophagus mass. Resolved.  5. Hypertension.  6. Agitation/insomnia following chemotherapy 06/15/2011-likely related to Decadron. The Decadron dose was reduced with week 2 of chemotherapy.  7. Odynophagia. Resolved     Disposition:  [name] remains in clinical remission from esophagus cancer. She is now 2 years out from completing chemotherapy and radiation. She will return for an office visit in 6 months.  Jamie Coder, MD  08/18/2014  10:41 AM

## 2014-12-07 ENCOUNTER — Other Ambulatory Visit: Payer: Self-pay

## 2015-03-05 ENCOUNTER — Ambulatory Visit (HOSPITAL_BASED_OUTPATIENT_CLINIC_OR_DEPARTMENT_OTHER): Payer: Medicare Other | Admitting: Oncology

## 2015-03-05 ENCOUNTER — Telehealth: Payer: Self-pay | Admitting: Oncology

## 2015-03-05 VITALS — BP 168/60 | HR 63 | Temp 97.9°F | Resp 18 | Ht 61.0 in | Wt 131.9 lb

## 2015-03-05 DIAGNOSIS — C154 Malignant neoplasm of middle third of esophagus: Secondary | ICD-10-CM

## 2015-03-05 NOTE — Progress Notes (Signed)
  [place] OFFICE PROGRESS NOTE   Diagnosis: Esophagus cancer  INTERVAL HISTORY:   Jamie [name] returns as scheduled. She feels well. She continues to live alone. Good appetite. No dysphagia. He continues to smoke. She sees Dr. Woody Seller every 3 months.  Objective:  Vital signs in last 24 hours:  Blood pressure 168/60, pulse 63, temperature 97.9 F (36.6 C), temperature source Oral, resp. rate 18, height 5\' 1"  (1.549 m), weight 131 lb 14.4 oz (59.829 kg), SpO2 97 %.    HEENT: Neck without mass Lymphatics: No cervical, supraclavicular, or axillary nodes Resp: Lungs clear bilaterally Cardio: Regular rate and rhythm GI: No hepatomegaly, no mass, nontender Vascular: No leg edema  Medications: I have reviewed the patient's current medications.  Assessment/Plan: 1. Squamous cell carcinoma of the midesophagus, staging CT evaluation on 05/20/2012 without evidence of metastatic disease . EUS on 06/03/2012 confirmed a uT3,uN0 lesion. -she began daily radiation on 06/10/2012 and the first cycle of weekly Taxol/carboplatin was given on 06/15/2011. The final cycle of weekly Taxol/carboplatin was given on 07/12/2012. She completed the course of radiation on 07/18/2012.  2. COPD.  3. Ongoing tobacco use.  4. Solid dysphagia secondary to the partially obstructing esophagus mass. Resolved.  5. Hypertension.  6. Agitation/insomnia following chemotherapy 06/15/2011-likely related to Decadron. The Decadron dose was reduced with week 2 of chemotherapy.  7. Odynophagia. Resolved     Disposition:  Jamie [name] remains in clinical remission from esophagus cancer. She is now almost 3 years out from diagnosis. She would like to continue clinical follow-up with Dr. Woody Seller. She is not scheduled for a follow-up appointment at the [facility]. Her daughter will contact us if she develops symptoms concerning for recurrent esophagus cancer. I am available to see her in the future as  needed.  Betsy Coder, MD  03/05/2015  10:10 AM

## 2015-03-05 NOTE — Telephone Encounter (Signed)
Gave and pritned avs °

## 2016-06-08 ENCOUNTER — Other Ambulatory Visit: Payer: Self-pay | Admitting: Nurse Practitioner

## 2016-07-18 DIAGNOSIS — I10 Essential (primary) hypertension: Secondary | ICD-10-CM | POA: Diagnosis not present

## 2016-07-18 DIAGNOSIS — E7800 Pure hypercholesterolemia, unspecified: Secondary | ICD-10-CM | POA: Diagnosis not present

## 2016-07-18 DIAGNOSIS — R69 Illness, unspecified: Secondary | ICD-10-CM | POA: Diagnosis not present

## 2016-07-27 DIAGNOSIS — R69 Illness, unspecified: Secondary | ICD-10-CM | POA: Diagnosis not present

## 2016-07-27 DIAGNOSIS — J449 Chronic obstructive pulmonary disease, unspecified: Secondary | ICD-10-CM | POA: Diagnosis not present

## 2016-07-27 DIAGNOSIS — G309 Alzheimer's disease, unspecified: Secondary | ICD-10-CM | POA: Diagnosis not present

## 2016-07-27 DIAGNOSIS — Z299 Encounter for prophylactic measures, unspecified: Secondary | ICD-10-CM | POA: Diagnosis not present

## 2016-07-27 DIAGNOSIS — M8580 Other specified disorders of bone density and structure, unspecified site: Secondary | ICD-10-CM | POA: Diagnosis not present

## 2016-09-13 ENCOUNTER — Inpatient Hospital Stay (HOSPITAL_COMMUNITY)
Admission: AD | Admit: 2016-09-13 | Discharge: 2016-09-20 | DRG: 481 | Disposition: A | Discharge status: Skilled Nursing Facility | Payer: Medicare HMO | Source: Other Acute Inpatient Hospital | Attending: Internal Medicine | Admitting: Internal Medicine

## 2016-09-13 ENCOUNTER — Other Ambulatory Visit: Payer: Self-pay | Admitting: Orthopedic Surgery

## 2016-09-13 ENCOUNTER — Encounter (HOSPITAL_COMMUNITY): Payer: Self-pay | Admitting: *Deleted

## 2016-09-13 ENCOUNTER — Encounter (HOSPITAL_COMMUNITY): Payer: Self-pay | Admitting: Internal Medicine

## 2016-09-13 DIAGNOSIS — R69 Illness, unspecified: Secondary | ICD-10-CM | POA: Diagnosis not present

## 2016-09-13 DIAGNOSIS — C159 Malignant neoplasm of esophagus, unspecified: Secondary | ICD-10-CM | POA: Diagnosis not present

## 2016-09-13 DIAGNOSIS — F17210 Nicotine dependence, cigarettes, uncomplicated: Secondary | ICD-10-CM | POA: Diagnosis not present

## 2016-09-13 DIAGNOSIS — W19XXXA Unspecified fall, initial encounter: Secondary | ICD-10-CM | POA: Diagnosis not present

## 2016-09-13 DIAGNOSIS — F0390 Unspecified dementia without behavioral disturbance: Secondary | ICD-10-CM | POA: Diagnosis not present

## 2016-09-13 DIAGNOSIS — J449 Chronic obstructive pulmonary disease, unspecified: Secondary | ICD-10-CM | POA: Diagnosis not present

## 2016-09-13 DIAGNOSIS — W06XXXA Fall from bed, initial encounter: Secondary | ICD-10-CM | POA: Diagnosis not present

## 2016-09-13 DIAGNOSIS — S72141A Displaced intertrochanteric fracture of right femur, initial encounter for closed fracture: Secondary | ICD-10-CM | POA: Diagnosis not present

## 2016-09-13 DIAGNOSIS — Z7982 Long term (current) use of aspirin: Secondary | ICD-10-CM | POA: Diagnosis not present

## 2016-09-13 DIAGNOSIS — M79606 Pain in leg, unspecified: Secondary | ICD-10-CM | POA: Diagnosis not present

## 2016-09-13 DIAGNOSIS — Z801 Family history of malignant neoplasm of trachea, bronchus and lung: Secondary | ICD-10-CM

## 2016-09-13 DIAGNOSIS — Z8781 Personal history of (healed) traumatic fracture: Secondary | ICD-10-CM

## 2016-09-13 DIAGNOSIS — Z79899 Other long term (current) drug therapy: Secondary | ICD-10-CM | POA: Diagnosis not present

## 2016-09-13 DIAGNOSIS — Z8501 Personal history of malignant neoplasm of esophagus: Secondary | ICD-10-CM | POA: Diagnosis not present

## 2016-09-13 DIAGNOSIS — I10 Essential (primary) hypertension: Secondary | ICD-10-CM | POA: Diagnosis not present

## 2016-09-13 DIAGNOSIS — S72141D Displaced intertrochanteric fracture of right femur, subsequent encounter for closed fracture with routine healing: Secondary | ICD-10-CM | POA: Diagnosis not present

## 2016-09-13 DIAGNOSIS — S72009A Fracture of unspecified part of neck of unspecified femur, initial encounter for closed fracture: Secondary | ICD-10-CM | POA: Diagnosis not present

## 2016-09-13 DIAGNOSIS — S7221XA Displaced subtrochanteric fracture of right femur, initial encounter for closed fracture: Secondary | ICD-10-CM | POA: Diagnosis not present

## 2016-09-13 DIAGNOSIS — Z820 Family history of epilepsy and other diseases of the nervous system: Secondary | ICD-10-CM | POA: Diagnosis not present

## 2016-09-13 DIAGNOSIS — Z419 Encounter for procedure for purposes other than remedying health state, unspecified: Secondary | ICD-10-CM

## 2016-09-13 DIAGNOSIS — Z841 Family history of disorders of kidney and ureter: Secondary | ICD-10-CM

## 2016-09-13 DIAGNOSIS — Z823 Family history of stroke: Secondary | ICD-10-CM

## 2016-09-13 DIAGNOSIS — Y92009 Unspecified place in unspecified non-institutional (private) residence as the place of occurrence of the external cause: Secondary | ICD-10-CM | POA: Diagnosis not present

## 2016-09-13 DIAGNOSIS — D72829 Elevated white blood cell count, unspecified: Secondary | ICD-10-CM | POA: Diagnosis present

## 2016-09-13 DIAGNOSIS — Z967 Presence of other bone and tendon implants: Secondary | ICD-10-CM | POA: Diagnosis not present

## 2016-09-13 DIAGNOSIS — M80051D Age-related osteoporosis with current pathological fracture, right femur, subsequent encounter for fracture with routine healing: Secondary | ICD-10-CM | POA: Diagnosis not present

## 2016-09-13 DIAGNOSIS — D62 Acute posthemorrhagic anemia: Secondary | ICD-10-CM | POA: Diagnosis not present

## 2016-09-13 DIAGNOSIS — M25551 Pain in right hip: Secondary | ICD-10-CM | POA: Diagnosis not present

## 2016-09-13 DIAGNOSIS — E785 Hyperlipidemia, unspecified: Secondary | ICD-10-CM | POA: Diagnosis not present

## 2016-09-13 DIAGNOSIS — R4182 Altered mental status, unspecified: Secondary | ICD-10-CM | POA: Diagnosis not present

## 2016-09-13 DIAGNOSIS — R1310 Dysphagia, unspecified: Secondary | ICD-10-CM | POA: Diagnosis not present

## 2016-09-13 DIAGNOSIS — M810 Age-related osteoporosis without current pathological fracture: Secondary | ICD-10-CM | POA: Diagnosis not present

## 2016-09-13 DIAGNOSIS — T148XXA Other injury of unspecified body region, initial encounter: Secondary | ICD-10-CM | POA: Diagnosis not present

## 2016-09-13 DIAGNOSIS — G8911 Acute pain due to trauma: Secondary | ICD-10-CM | POA: Diagnosis not present

## 2016-09-13 DIAGNOSIS — Z86010 Personal history of colonic polyps: Secondary | ICD-10-CM | POA: Diagnosis not present

## 2016-09-13 DIAGNOSIS — Z8371 Family history of colonic polyps: Secondary | ICD-10-CM

## 2016-09-13 DIAGNOSIS — Z9889 Other specified postprocedural states: Secondary | ICD-10-CM

## 2016-09-13 DIAGNOSIS — T84194D Other mechanical complication of internal fixation device of right femur, subsequent encounter: Secondary | ICD-10-CM | POA: Diagnosis not present

## 2016-09-13 DIAGNOSIS — R4702 Dysphasia: Secondary | ICD-10-CM | POA: Diagnosis not present

## 2016-09-13 HISTORY — DX: Unspecified dementia, unspecified severity, without behavioral disturbance, psychotic disturbance, mood disturbance, and anxiety: F03.90

## 2016-09-13 HISTORY — DX: Age-related osteoporosis without current pathological fracture: M81.0

## 2016-09-13 MED ORDER — HEPARIN SODIUM (PORCINE) 5000 UNIT/ML IJ SOLN
5000.0000 [IU] | Freq: Once | INTRAMUSCULAR | Status: AC
  Administered 2016-09-13: 5000 [IU] via SUBCUTANEOUS
  Filled 2016-09-13: qty 1

## 2016-09-13 MED ORDER — CHLORHEXIDINE GLUCONATE 4 % EX LIQD
60.0000 mL | Freq: Once | CUTANEOUS | Status: DC
  Filled 2016-09-13: qty 15

## 2016-09-13 MED ORDER — RISPERIDONE 0.5 MG PO TABS
0.2500 mg | ORAL_TABLET | Freq: Every day | ORAL | Status: DC
  Administered 2016-09-15 – 2016-09-16 (×2): 0.25 mg via ORAL
  Filled 2016-09-13 (×3): qty 1

## 2016-09-13 MED ORDER — CALCITRIOL 0.5 MCG PO CAPS
0.5000 ug | ORAL_CAPSULE | Freq: Every day | ORAL | Status: DC
  Administered 2016-09-15 – 2016-09-20 (×6): 0.5 ug via ORAL
  Filled 2016-09-13 (×6): qty 1

## 2016-09-13 MED ORDER — CHOLECALCIFEROL 10 MCG (400 UNIT) PO TABS
400.0000 [IU] | ORAL_TABLET | Freq: Every day | ORAL | Status: DC
  Administered 2016-09-15 – 2016-09-20 (×6): 400 [IU] via ORAL
  Filled 2016-09-13 (×6): qty 1

## 2016-09-13 MED ORDER — HYDROCODONE-ACETAMINOPHEN 5-325 MG PO TABS: 1.0000 | ORAL_TABLET | Freq: Four times a day (QID) | ORAL | Status: DC | PRN

## 2016-09-13 MED ORDER — CITALOPRAM HYDROBROMIDE 20 MG PO TABS
20.0000 mg | ORAL_TABLET | Freq: Every day | ORAL | Status: DC
Start: 2016-09-14 — End: 2016-09-20
  Administered 2016-09-15 – 2016-09-20 (×6): 20 mg via ORAL
  Filled 2016-09-13 (×6): qty 1

## 2016-09-13 MED ORDER — METOPROLOL SUCCINATE ER 25 MG PO TB24
25.0000 mg | ORAL_TABLET | Freq: Every day | ORAL | Status: DC
  Administered 2016-09-15 – 2016-09-19 (×5): 25 mg via ORAL
  Filled 2016-09-13 (×6): qty 1

## 2016-09-13 MED ORDER — SODIUM CHLORIDE 0.9 % IV SOLN
INTRAVENOUS | Status: DC
  Administered 2016-09-13: 22:00:00 via INTRAVENOUS

## 2016-09-13 MED ORDER — MORPHINE SULFATE (PF) 2 MG/ML IV SOLN
0.5000 mg | INTRAVENOUS | Status: DC | PRN
  Administered 2016-09-14 (×2): 0.5 mg via INTRAVENOUS
  Filled 2016-09-13 (×2): qty 1

## 2016-09-13 NOTE — H&P (Signed)
History and Physical    [place] TDD:[id number] DOB: 1928-07-27 DOA: 09/13/2016  PCP: Glenda Chroman, MD  Patient coming from: Home via [facility]  I have personally briefly reviewed patient's old medical records in [place]  Chief Complaint: R hip pain  HPI: Jamie [name] is a 83 y.o. female with medical history significant of dementia, esophageal cancer believed to be in remission after treatment 4 years ago.  Patient presents to the ED at [facility] following a mechanical fall at home.  She normally ambulates without a walker or cane (although family says she probably shouldn't be).  Was found down at home and brought in to ED at [facility] with severe hip pain and deformity.   ED Course: R hip subtroch fracture with avulsion of lesser trochanter.  Labs, UA, and CXR are unremarkable apparently per report.  (see A/P below for my discussion on CXR).   Review of Systems: As per HPI otherwise 10 point review of systems negative.   Past Medical History:  Diagnosis Date  . Adenomatous colon polyp 12/1993  . Allergy    lipitor  . Anxiety   . COPD (chronic obstructive pulmonary disease) ([place])   . Dementia   . Diverticulosis   . Dysphagia    to solids and pills .  On left side  . Esophageal cancer ([place]) 05/15/12   bx=, middle,Invasive squamous cell ca,  . Hot flashes   . Hyperlipidemia   . Hypertension   . Mitral valve insufficiency   . Osteoporosis     Past Surgical History:  Procedure Laterality Date  . ABDOMINAL HYSTERECTOMY  6237,6283   salpingo-opherctomy 1986  . ANTERIOR AND POSTERIOR VAGINAL REPAIR W/ SACROSPINOUS LIGAMENT SUSPENSION    . Biopsy of Esophagus  05/15/12   Invasive Squamous Cell Carcinoma  . colon polyps     hx adenomatous  . COLONOSCOPY  2008   neg  . EUS  06/03/2012   Procedure: UPPER ENDOSCOPIC ULTRASOUND (EUS) LINEAR;  Surgeon: Milus Banister, MD;  Location: [facility] ENDOSCOPY;  Service: Endoscopy;  Laterality: N/A;  +/-  radial and liner   . EXPLORATORY LAPAROTOMY  1985  . orif left elbow  05/13/08   s/p fall, olecranon fracture  . TONSILLECTOMY    . TUBAL LIGATION       reports that she has been smoking Cigarettes.  She has a 15.00 pack-year smoking history. She has never used smokeless tobacco. She reports that she does not drink alcohol or use drugs.  Allergies  Allergen Reactions  . Lipitor [Atorvastatin]     Aching, musculoskeletal    Family History  Problem Relation Age of Onset  . CVA Mother   . Lung cancer Mother   . Alzheimer's disease Mother   . Cancer Mother   . Kidney failure Father   . Colon polyps Daughter      Prior to Admission medications   Medication Sig Start Date End Date Taking? Authorizing Provider  aspirin 81 MG tablet Take 81 mg by mouth daily.    Historical Provider, MD  Cholecalciferol (VITAMIN D-3) 1000 UNITS CAPS Take 1 capsule by mouth daily.    Historical Provider, MD  citalopram (CELEXA) 20 MG tablet Take 20 mg by mouth every evening.     Historical Provider, MD  clonazePAM (KLONOPIN) 0.5 MG tablet Take 0.5 mg by mouth daily. 07/20/14   Historical Provider, MD  donepezil (ARICEPT) 10 MG tablet Take 10 mg by mouth daily. 03/11/13  Historical Provider, MD  Memantine HCl ER (NAMENDA XR) 28 MG CP24 Take 28 mg by mouth daily.    Glenda Chroman, MD  metoprolol succinate (TOPROL-XL) 25 MG 24 hr tablet Take 25 mg by mouth daily with lunch.     Historical Provider, MD  naproxen sodium (ANAPROX) 220 MG tablet Take 220 mg by mouth as needed.    Historical Provider, MD    Physical Exam: Vitals:   09/13/16 1847 09/13/16 2010  BP: (!) 147/61 (!) 153/48  Pulse: 67 71  Resp: 18 18  Temp: 99 F (37.2 C) 98.3 F (36.8 C)  TempSrc: Oral Axillary  SpO2: 98% 98%    Constitutional: NAD, calm, comfortable Eyes: PERRL, lids and conjunctivae normal ENMT: Mucous membranes are moist. Posterior pharynx clear of any exudate or lesions.Normal dentition.  Neck: normal, supple, no  masses, no thyromegaly Respiratory: clear to auscultation bilaterally, no wheezing, no crackles. Normal respiratory effort. No accessory muscle use.  Cardiovascular: Regular rate and rhythm, no murmurs / rubs / gallops. No extremity edema. 2+ pedal pulses. No carotid bruits.  Abdomen: no tenderness, no masses palpated. No hepatosplenomegaly. Bowel sounds positive.  Musculoskeletal: R hip tenderness, deformity Skin: no rashes, lesions, ulcers. No induration Neurologic: CN 2-12 grossly intact. Sensation intact, DTR normal. Strength 5/5 in all 4.  Psychiatric: Demented   Labs on Admission: I have personally reviewed following labs and imaging studies  CBC: No results for input(s): WBC, NEUTROABS, HGB, HCT, MCV, PLT in the last 168 hours. Basic Metabolic Panel: No results for input(s): NA, K, CL, CO2, GLUCOSE, BUN, CREATININE, CALCIUM, MG, PHOS in the last 168 hours. GFR: CrCl cannot be calculated (Patient's most recent lab result is older than the maximum 21 days allowed.). Liver Function Tests: No results for input(s): AST, ALT, ALKPHOS, BILITOT, PROT, ALBUMIN in the last 168 hours. No results for input(s): LIPASE, AMYLASE in the last 168 hours. No results for input(s): AMMONIA in the last 168 hours. Coagulation Profile: No results for input(s): INR, PROTIME in the last 168 hours. Cardiac Enzymes: No results for input(s): CKTOTAL, CKMB, CKMBINDEX, TROPONINI in the last 168 hours. BNP (last 3 results) No results for input(s): PROBNP in the last 8760 hours. HbA1C: No results for input(s): HGBA1C in the last 72 hours. CBG: No results for input(s): GLUCAP in the last 168 hours. Lipid Profile: No results for input(s): CHOL, HDL, LDLCALC, TRIG, CHOLHDL, LDLDIRECT in the last 72 hours. Thyroid Function Tests: No results for input(s): TSH, T4TOTAL, FREET4, T3FREE, THYROIDAB in the last 72 hours. Anemia Panel: No results for input(s): VITAMINB12, FOLATE, FERRITIN, TIBC, IRON, RETICCTPCT in  the last 72 hours. Urine analysis: No results found for: COLORURINE, APPEARANCEUR, LABSPEC, PHURINE, GLUCOSEU, HGBUR, BILIRUBINUR, KETONESUR, PROTEINUR, UROBILINOGEN, NITRITE, LEUKOCYTESUR  Radiological Exams on Admission: No results found.  EKG: Independently reviewed.  Assessment/Plan Principal Problem:   Closed fracture of subtrochanteric section of femur, right, initial encounter ([place]) Active Problems:   History of esophageal cancer   Dementia    1. Closed subtrochanteric femur fx - 1. Hip fx pathway 2. NPO after midnight 3. IVF: NS at 75 cc/hr 4. Pain control per pathway 5. Labs and UA unremarkable except for mild leukocytosis.  CXR is presumably unchanged from priors based on the documentation from [facility] where they state "NAD".  On my read she does have widened mediastinum but this certainly could be chronic from her prior history of and treatment of esophageal cancer although we dont really have a prior available  for comparison as the last CXR we have on file is 2009. 2. Dementia - 1. Continue home meds 3. HTN - continue metoprolol  DVT prophylaxis: One dose of heparin now (more than 12h pre-op), then SCDs only.  Needs anticoagulation ordered post-op Code Status: Full Family Communication: Family at bedside Disposition Plan: To SNF / Rehab after visit Consults called: Dr. Mardelle Matte Admission status: Admit to inpatient for operative repair of hip fx.   Etta Quill DO [facility] Pager [phone]  If 7AM-7PM, please contact day team taking care of patient [email or web address] Password [facility]  09/13/2016, 9:51 PM

## 2016-09-13 NOTE — Progress Notes (Signed)
Nursing staff, Carley Hammed, confirmed that pt is to be admitted today.

## 2016-09-14 ENCOUNTER — Inpatient Hospital Stay (HOSPITAL_COMMUNITY): Admission: RE | Admit: 2016-09-14 | Payer: Medicare HMO | Source: Ambulatory Visit | Admitting: Orthopedic Surgery

## 2016-09-14 ENCOUNTER — Encounter (HOSPITAL_COMMUNITY)
Admission: AD | Disposition: A | Discharge status: Skilled Nursing Facility | Payer: Self-pay | Source: Other Acute Inpatient Hospital | Attending: Internal Medicine

## 2016-09-14 ENCOUNTER — Inpatient Hospital Stay (HOSPITAL_COMMUNITY): Payer: Medicare HMO | Admitting: Anesthesiology

## 2016-09-14 ENCOUNTER — Inpatient Hospital Stay (HOSPITAL_COMMUNITY): Payer: Medicare HMO

## 2016-09-14 ENCOUNTER — Encounter (HOSPITAL_COMMUNITY): Payer: Self-pay | Admitting: Surgery

## 2016-09-14 HISTORY — PX: FEMUR IM NAIL: SHX1597

## 2016-09-14 LAB — BASIC METABOLIC PANEL
ANION GAP: 4 — AB (ref 5–15)
BUN: 19 mg/dL (ref 6–20)
CALCIUM: 7.6 mg/dL — AB (ref 8.9–10.3)
CO2: 26 mmol/L (ref 22–32)
CREATININE: 0.9 mg/dL (ref 0.44–1.00)
Chloride: 109 mmol/L (ref 101–111)
GFR calc Af Amer: >60 mL/min (ref >60)
GFR, EST NON AFRICAN AMERICAN: 57 mL/min — AB (ref >60)
GLUCOSE: 110 mg/dL — AB (ref 65–99)
Potassium: 3.6 mmol/L (ref 3.5–5.1)
Sodium: 139 mmol/L (ref 135–145)

## 2016-09-14 LAB — ABO/RH: ABO/RH(D): O NEG

## 2016-09-14 LAB — CBC
HCT: 29.4 % — ABNORMAL LOW (ref 36.0–46.0)
Hemoglobin: 9.5 g/dL — ABNORMAL LOW (ref 12.0–15.0)
MCH: 29.9 pg (ref 26.0–34.0)
MCHC: 32.3 g/dL (ref 30.0–36.0)
MCV: 92.5 fL (ref 78.0–100.0)
PLATELETS: 158 10*3/uL (ref 150–400)
RBC: 3.18 MIL/uL — ABNORMAL LOW (ref 3.87–5.11)
RDW: 13.3 % (ref 11.5–15.5)
WBC: 8.5 10*3/uL (ref 4.0–10.5)

## 2016-09-14 LAB — MRSA PCR SCREENING: MRSA BY PCR: NEGATIVE

## 2016-09-14 SURGERY — INSERTION, INTRAMEDULLARY ROD, FEMUR
Anesthesia: General | Site: Hip | Laterality: Right

## 2016-09-14 MED ORDER — CEFAZOLIN SODIUM-DEXTROSE 2-4 GM/100ML-% IV SOLN
2.0000 g | INTRAVENOUS | Status: AC
  Administered 2016-09-14: 2 g via INTRAVENOUS

## 2016-09-14 MED ORDER — SUCCINYLCHOLINE CHLORIDE 20 MG/ML IJ SOLN
INTRAMUSCULAR | Status: DC | PRN
  Administered 2016-09-14: 60 mg via INTRAVENOUS

## 2016-09-14 MED ORDER — ALUM & MAG HYDROXIDE-SIMETH 200-200-20 MG/5ML PO SUSP: 30.0000 mL | ORAL | Status: DC | PRN

## 2016-09-14 MED ORDER — PHENYLEPHRINE HCL 10 MG/ML IJ SOLN
INTRAMUSCULAR | Status: DC | PRN
  Administered 2016-09-14: 80 ug via INTRAVENOUS

## 2016-09-14 MED ORDER — 0.9 % SODIUM CHLORIDE (POUR BTL) OPTIME
TOPICAL | Status: DC | PRN
  Administered 2016-09-14: 1000 mL

## 2016-09-14 MED ORDER — MEPERIDINE HCL 25 MG/ML IJ SOLN: 12.5000 mg | INTRAMUSCULAR | Status: DC | PRN

## 2016-09-14 MED ORDER — ENOXAPARIN SODIUM 30 MG/0.3ML ~~LOC~~ SOLN: 30.0000 mg | SUBCUTANEOUS | Status: DC

## 2016-09-14 MED ORDER — LACTATED RINGERS IV SOLN
INTRAVENOUS | Status: DC
  Administered 2016-09-14: 13:00:00 via INTRAVENOUS

## 2016-09-14 MED ORDER — ONDANSETRON HCL 4 MG PO TABS: 4.0000 mg | ORAL_TABLET | Freq: Four times a day (QID) | ORAL | Status: DC | PRN

## 2016-09-14 MED ORDER — ACETAMINOPHEN 325 MG PO TABS: 650.0000 mg | ORAL_TABLET | Freq: Four times a day (QID) | ORAL | 1 refills | Status: DC | PRN

## 2016-09-14 MED ORDER — DOCUSATE SODIUM 100 MG PO CAPS
100.0000 mg | ORAL_CAPSULE | Freq: Two times a day (BID) | ORAL | Status: DC
  Administered 2016-09-14 – 2016-09-16 (×5): 100 mg via ORAL
  Filled 2016-09-14 (×5): qty 1

## 2016-09-14 MED ORDER — MORPHINE SULFATE (PF) 2 MG/ML IV SOLN
2.0000 mg | INTRAVENOUS | Status: DC | PRN
  Administered 2016-09-15: 2 mg via INTRAVENOUS
  Filled 2016-09-14: qty 1

## 2016-09-14 MED ORDER — ACETAMINOPHEN 650 MG RE SUPP: 650.0000 mg | Freq: Four times a day (QID) | RECTAL | Status: DC | PRN

## 2016-09-14 MED ORDER — DEXAMETHASONE SODIUM PHOSPHATE 10 MG/ML IJ SOLN
INTRAMUSCULAR | Status: DC | PRN
  Administered 2016-09-14: 5 mg via INTRAVENOUS

## 2016-09-14 MED ORDER — PHENOL 1.4 % MT LIQD: 1.0000 | OROMUCOSAL | Status: DC | PRN

## 2016-09-14 MED ORDER — SODIUM CHLORIDE 0.9 % IV SOLN: 75.0000 mL/h | INTRAVENOUS | Status: DC

## 2016-09-14 MED ORDER — HYDROCODONE-ACETAMINOPHEN 5-325 MG PO TABS
1.0000 | ORAL_TABLET | Freq: Four times a day (QID) | ORAL | Status: DC | PRN
  Administered 2016-09-15: 1 via ORAL
  Administered 2016-09-15: 2 via ORAL
  Administered 2016-09-16 – 2016-09-18 (×2): 1 via ORAL
  Administered 2016-09-19 – 2016-09-20 (×4): 2 via ORAL
  Filled 2016-09-14 (×2): qty 1
  Filled 2016-09-14 (×2): qty 2
  Filled 2016-09-14: qty 1
  Filled 2016-09-14 (×4): qty 2

## 2016-09-14 MED ORDER — PHENYLEPHRINE HCL 10 MG/ML IJ SOLN
INTRAMUSCULAR | Status: DC | PRN
  Administered 2016-09-14: 25 ug/min via INTRAVENOUS

## 2016-09-14 MED ORDER — SENNA 8.6 MG PO TABS
1.0000 | ORAL_TABLET | Freq: Two times a day (BID) | ORAL | Status: DC
  Administered 2016-09-14 – 2016-09-16 (×5): 8.6 mg via ORAL
  Filled 2016-09-14 (×5): qty 1

## 2016-09-14 MED ORDER — POLYETHYLENE GLYCOL 3350 17 G PO PACK: 17.0000 g | PACK | Freq: Every day | ORAL | Status: DC | PRN

## 2016-09-14 MED ORDER — ENOXAPARIN SODIUM 30 MG/0.3ML ~~LOC~~ SOLN: 30.0000 mg | SUBCUTANEOUS | 0 refills | Status: DC

## 2016-09-14 MED ORDER — POVIDONE-IODINE 10 % EX SWAB: 2.0000 "application " | Freq: Once | CUTANEOUS | Status: DC

## 2016-09-14 MED ORDER — FENTANYL CITRATE (PF) 100 MCG/2ML IJ SOLN: 25.0000 ug | INTRAMUSCULAR | Status: DC | PRN

## 2016-09-14 MED ORDER — CEFAZOLIN SODIUM-DEXTROSE 2-4 GM/100ML-% IV SOLN
2.0000 g | Freq: Four times a day (QID) | INTRAVENOUS | Status: AC
  Administered 2016-09-14 – 2016-09-15 (×2): 2 g via INTRAVENOUS
  Filled 2016-09-14 (×2): qty 100

## 2016-09-14 MED ORDER — FENTANYL CITRATE (PF) 100 MCG/2ML IJ SOLN
INTRAMUSCULAR | Status: DC | PRN
  Administered 2016-09-14: 50 ug via INTRAVENOUS
  Administered 2016-09-14: 75 ug via INTRAVENOUS

## 2016-09-14 MED ORDER — ACETAMINOPHEN 325 MG PO TABS
650.0000 mg | ORAL_TABLET | Freq: Four times a day (QID) | ORAL | Status: DC | PRN
  Administered 2016-09-15 – 2016-09-17 (×4): 650 mg via ORAL
  Filled 2016-09-14 (×4): qty 2

## 2016-09-14 MED ORDER — LIDOCAINE HCL (CARDIAC) 20 MG/ML IV SOLN
INTRAVENOUS | Status: DC | PRN
  Administered 2016-09-14: 80 mg via INTRAVENOUS

## 2016-09-14 MED ORDER — PROPOFOL 10 MG/ML IV BOLUS
INTRAVENOUS | Status: DC | PRN
  Administered 2016-09-14: 80 mg via INTRAVENOUS
  Administered 2016-09-14: 100 mg via INTRAVENOUS

## 2016-09-14 MED ORDER — ENOXAPARIN SODIUM 40 MG/0.4ML ~~LOC~~ SOLN
40.0000 mg | SUBCUTANEOUS | Status: DC
  Administered 2016-09-15 – 2016-09-20 (×6): 40 mg via SUBCUTANEOUS
  Filled 2016-09-14 (×6): qty 0.4

## 2016-09-14 MED ORDER — ONDANSETRON HCL 4 MG/2ML IJ SOLN: 4.0000 mg | Freq: Once | INTRAMUSCULAR | Status: DC | PRN

## 2016-09-14 MED ORDER — MAGNESIUM CITRATE PO SOLN: 1.0000 | Freq: Once | ORAL | Status: DC | PRN

## 2016-09-14 MED ORDER — MENTHOL 3 MG MT LOZG: 1.0000 | LOZENGE | OROMUCOSAL | Status: DC | PRN

## 2016-09-14 MED ORDER — ONDANSETRON HCL 4 MG/2ML IJ SOLN: 4.0000 mg | Freq: Four times a day (QID) | INTRAMUSCULAR | Status: DC | PRN

## 2016-09-14 MED ORDER — BISACODYL 10 MG RE SUPP: 10.0000 mg | Freq: Every day | RECTAL | Status: DC | PRN

## 2016-09-14 MED ORDER — ENSURE ENLIVE PO LIQD
237.0000 mL | Freq: Two times a day (BID) | ORAL | Status: DC
  Administered 2016-09-16 – 2016-09-20 (×9): 237 mL via ORAL

## 2016-09-14 MED ORDER — FERROUS SULFATE 325 (65 FE) MG PO TABS
325.0000 mg | ORAL_TABLET | Freq: Three times a day (TID) | ORAL | Status: DC
  Administered 2016-09-15 – 2016-09-20 (×13): 325 mg via ORAL
  Filled 2016-09-14 (×13): qty 1

## 2016-09-14 MED ORDER — EPHEDRINE SULFATE 50 MG/ML IJ SOLN
INTRAMUSCULAR | Status: DC | PRN
  Administered 2016-09-14: 2.5 mg via INTRAVENOUS
  Administered 2016-09-14: 5 mg via INTRAVENOUS
  Administered 2016-09-14: 10 mg via INTRAVENOUS

## 2016-09-14 SURGICAL SUPPLY — 47 items
APL SKNCLS STERI-STRIP NONHPOA (GAUZE/BANDAGES/DRESSINGS) ×1
BENZOIN TINCTURE PRP APPL 2/3 (GAUZE/BANDAGES/DRESSINGS) ×2 IMPLANT
BIT DRILL 4.3MMS DISTAL GRDTED (BIT) IMPLANT
BOOTCOVER CLEANROOM LRG (PROTECTIVE WEAR) ×4 IMPLANT
CLSR STERI-STRIP ANTIMIC 1/2X4 (GAUZE/BANDAGES/DRESSINGS) ×2 IMPLANT
COVER PERINEAL POST (MISCELLANEOUS) ×2 IMPLANT
COVER SURGICAL LIGHT HANDLE (MISCELLANEOUS) ×2 IMPLANT
DRAPE STERI IOBAN 125X83 (DRAPES) ×2 IMPLANT
DRILL 4.3MMS DISTAL GRADUATED (BIT) ×2
DRSG MEPILEX BORDER 4X4 (GAUZE/BANDAGES/DRESSINGS) ×2 IMPLANT
DRSG MEPILEX BORDER 4X8 (GAUZE/BANDAGES/DRESSINGS) ×3 IMPLANT
DURAPREP 26ML APPLICATOR (WOUND CARE) ×2 IMPLANT
ELECT CAUTERY BLADE 6.4 (BLADE) ×2 IMPLANT
ELECT REM PT RETURN 9FT ADLT (ELECTROSURGICAL) ×2
ELECTRODE REM PT RTRN 9FT ADLT (ELECTROSURGICAL) ×1 IMPLANT
EVACUATOR 1/8 PVC DRAIN (DRAIN) IMPLANT
FACESHIELD WRAPAROUND (MASK) ×4 IMPLANT
FACESHIELD WRAPAROUND OR TEAM (MASK) ×2 IMPLANT
GAUZE XEROFORM 5X9 LF (GAUZE/BANDAGES/DRESSINGS) ×2 IMPLANT
GLOVE BIOGEL PI ORTHO PRO SZ8 (GLOVE) ×2
GLOVE ORTHO TXT STRL SZ7.5 (GLOVE) ×4 IMPLANT
GLOVE PI ORTHO PRO STRL SZ8 (GLOVE) ×2 IMPLANT
GLOVE SURG ORTHO 8.0 STRL STRW (GLOVE) ×4 IMPLANT
GOWN STRL REUS W/ TWL XL LVL3 (GOWN DISPOSABLE) ×1 IMPLANT
GOWN STRL REUS W/TWL 2XL LVL3 (GOWN DISPOSABLE) ×1 IMPLANT
GOWN STRL REUS W/TWL XL LVL3 (GOWN DISPOSABLE) ×2
GUIDEPIN 3.2X17.5 THRD DISP (PIN) ×1 IMPLANT
GUIDEWIRE BALL NOSE 100CM (WIRE) ×1 IMPLANT
HFN RH 130 DEG 11MM X 360MM (Orthopedic Implant) ×1 IMPLANT
KIT ROOM TURNOVER OR (KITS) ×2 IMPLANT
LINER BOOT UNIVERSAL DISP (MISCELLANEOUS) ×2 IMPLANT
MANIFOLD NEPTUNE II (INSTRUMENTS) ×2 IMPLANT
NS IRRIG 1000ML POUR BTL (IV SOLUTION) ×2 IMPLANT
PACK GENERAL/GYN (CUSTOM PROCEDURE TRAY) ×2 IMPLANT
PAD ARMBOARD 7.5X6 YLW CONV (MISCELLANEOUS) ×4 IMPLANT
SCREW BONE CORTICAL 5.0X38 (Screw) ×1 IMPLANT
SCREW LAG HIP NAIL 10.5X95 (Screw) ×1 IMPLANT
SCREWDRIVER HEX TIP 3.5MM (MISCELLANEOUS) ×1 IMPLANT
STAPLER VISISTAT 35W (STAPLE) ×2 IMPLANT
SUT VIC AB 0 CTB1 27 (SUTURE) ×2 IMPLANT
SUT VIC AB 2-0 FS1 27 (SUTURE) ×2 IMPLANT
SUT VIC AB 2-0 SH 27 (SUTURE) ×2
SUT VIC AB 2-0 SH 27XBRD (SUTURE) IMPLANT
SUT VIC AB 3-0 SH 8-18 (SUTURE) ×2 IMPLANT
TOWEL OR 17X24 6PK STRL BLUE (TOWEL DISPOSABLE) ×2 IMPLANT
TOWEL OR 17X26 10 PK STRL BLUE (TOWEL DISPOSABLE) ×2 IMPLANT
WATER STERILE IRR 1000ML POUR (IV SOLUTION) ×2 IMPLANT

## 2016-09-14 NOTE — Anesthesia Procedure Notes (Signed)
Procedure Name: Intubation Date/Time: 09/14/2016 1:46 PM Performed by: Tressia Miners LEFFEW Pre-anesthesia Checklist: Patient identified, Patient being monitored, Timeout performed, Emergency Drugs available and Suction available Patient Re-evaluated:Patient Re-evaluated prior to inductionOxygen Delivery Method: Circle System Utilized Preoxygenation: Pre-oxygenation with 100% oxygen Intubation Type: IV induction Ventilation: Mask ventilation without difficulty Laryngoscope Size: Mac and 3 Grade View: Grade II Tube type: Oral Tube size: 6.5 mm Number of attempts: 2 Airway Equipment and Method: Stylet Placement Confirmation: ETT inserted through vocal cords under direct vision,  positive ETCO2 and breath sounds checked- equal and bilateral Secured at: 22 cm Tube secured with: Tape Dental Injury: Teeth and Oropharynx as per pre-operative assessment  Difficulty Due To: Difficulty was anticipated Future Recommendations: Recommend- induction with short-acting agent, and alternative techniques readily available Comments: Unable to pass any ETT larger than 6.5. Patient has had radiation for esophageal cancer. VL with glidescope unsuccessful. DL with Mac 3 by Paulina Fusi MD.

## 2016-09-14 NOTE — Anesthesia Postprocedure Evaluation (Signed)
Anesthesia Post Note  Patient: Jamie [name]  Procedure(s) Performed: Procedure(s) (LRB): INTRAMEDULLARY (IM) NAIL FEMORAL (Right)  Patient location during evaluation: PACU Anesthesia Type: General Level of consciousness: awake and alert Pain management: pain level controlled Vital Signs Assessment: post-procedure vital signs reviewed and stable Respiratory status: spontaneous breathing, nonlabored ventilation, respiratory function stable and patient connected to nasal cannula oxygen Cardiovascular status: blood pressure returned to baseline and stable Postop Assessment: no signs of nausea or vomiting Anesthetic complications: no       Last Vitals:  Vitals:   09/14/16 1630 09/14/16 1645  BP:    Pulse: 72 78  Resp: 14 17  Temp:  36.3 C    Last Pain:  Vitals:   09/14/16 1140  TempSrc:   PainSc: 3         RLE Motor Response: Purposeful movement;Responds to commands (09/14/16 1645) RLE Sensation: Other (Comment) (09/14/16 1645)      Tiajuana Amass

## 2016-09-14 NOTE — Op Note (Signed)
DATE OF SURGERY:  09/14/2016  TIME: 2:58 PM  PATIENT NAME:  Jamie [name]  AGE: 83 y.o.  PRE-OPERATIVE DIAGNOSIS:  Right hip reverse obliquity 4 part peritrochanteric hip fracture  POST-OPERATIVE DIAGNOSIS:  SAME  PROCEDURE:  INTRAMEDULLARY (IM) NAIL RIGHT PERITROCHANTERIC HIP FRACTURE  SURGEON:  Nasim Habeeb P  ASSISTANT:  Joya Gaskins, OPA-C, present and scrubbed throughout the case, critical for assistance with exposure, retraction, instrumentation, and closure.  OPERATIVE IMPLANTS: Biomet Affixus 360 x 11  femoral nail with interlocking 95 cephalomedullary screw into the femoral head and a distal interlocking bole  UNIQUE ASPECTS OF THE CASE:  The proximal femur was split into 4 pieces, and there was a greater trochanteric lateral segment as well as a basocervical segment that were both split, as well as a transverse reverse obliquity subtrochanteric femur fracture pattern. Her native femoral bow was such that the arc of the implant, even though it has a distal band, did calm adjacent to the anterior cortex, and the interlocking bolt was somewhat anterior, such that on the AP radiograph it did not appear that it had bicortical fixation, however by fixation feel, as well as oblique radiograph, it was in fact bicortical.  ESTIMATED BLOOD LOSS: 100 mL  PREOPERATIVE INDICATIONS:  Jamie [name] is a 83 y.o. year old who fell and suffered a hip fracture. She was brought into the ER and then admitted and optimized and then elected for surgical intervention.    The risks benefits and alternatives were discussed with the patient and the family including but not limited to the risks of nonoperative treatment, versus surgical intervention including infection, bleeding, nerve injury, malunion, nonunion, hardware prominence, hardware failure, need for hardware removal, blood clots, cardiopulmonary complications, morbidity, mortality, among others, and they were willing to proceed.     OPERATIVE PROCEDURE:  The patient was brought to the operating room and placed in the supine position. General anesthesia was administered, with a foley. She was placed on the fracture table.  Closed reduction was performed under C-arm guidance. The length of the femur was also measured using fluoroscopy. Time out was then performed after sterile prep and drape. She received preoperative antibiotics.  Incision was made proximal to the greater trochanter. A guidewire was placed in the appropriate position. Confirmation was made on AP and lateral views. The above-named nail was opened. I opened the proximal femur with a reamer. I then placed the nail by hand down, although it did have somewhat of an interference fit at the medullary shaft. I did not need to ream the femur.  Once the nail was completely seated, I placed a guidepin into the femoral head into the center center position. I measured the length, and then reamed the lateral cortex and up into the head. I then placed the supplementary medullary screw. Compression was applied, which interestingly brought the medial calcar in line with the shaft, although as the barrel of the screw laterally came underneath the lateral segment, it slightly kicked the lateral cortex to be further lateral. Nonetheless I had overall excellent alignment of the 4 part proximal femur fracture.  Near anatomic fixation achieved. Bone quality was mediocre.  I then secured the proximal interlocking bolt, and then removed the instruments, and took final C-arm pictures AP and lateral the entire length of the leg. I secured a distal interlocking bolts using perfect circles technique, in order to provide some rotational control, and allow for some degree of compression at the reverse obliquity pattern from proximal to  distal, but not too much shortening.   Anatomic reconstruction was achieved, and the wounds were irrigated copiously and closed with Vicryl followed by  Steri-Strips and sterile gauze for the skin. The patient was awakened and returned to PACU in stable and satisfactory condition. There no complications and the patient tolerated the procedure well.  She will be weightbearing as tolerated, and will be on Lovenox  for a period of three weeks after discharge.   Marchia Bond, M.D.

## 2016-09-14 NOTE — Anesthesia Preprocedure Evaluation (Addendum)
Anesthesia Evaluation   Patient confused    Reviewed: Allergy & Precautions, H&P , NPO status , Patient's Chart, lab work & pertinent test results, reviewed documented beta blocker date and time   Airway Mallampati: II  TM Distance: >3 FB     Dental  (+) Teeth Intact, Dental Advisory Given   Pulmonary COPD, Current Smoker,    breath sounds clear to auscultation       Cardiovascular hypertension, + Peripheral Vascular Disease   Rhythm:Regular Rate:Normal     Neuro/Psych PSYCHIATRIC DISORDERS    GI/Hepatic   Endo/Other    Renal/GU      Musculoskeletal   Abdominal   Peds  Hematology   Anesthesia Other Findings   Reproductive/Obstetrics                            Anesthesia Physical Anesthesia Plan  ASA: III  Anesthesia Plan: General   Post-op Pain Management:    Induction: Intravenous  Airway Management Planned: Oral ETT  Additional Equipment:   Intra-op Plan:   Post-operative Plan: Extubation in OR  Informed Consent: I have reviewed the patients History and Physical, chart, labs and discussed the procedure including the risks, benefits and alternatives for the proposed anesthesia with the patient or authorized representative who has indicated his/her understanding and acceptance.   Consent reviewed with POA  Plan Discussed with: CRNA  Anesthesia Plan Comments: (Patient didn't take Beta blocker for several days, HR 68. Will not give preop. Will give IV intraop if needed.)      Anesthesia Quick Evaluation

## 2016-09-14 NOTE — Consult Note (Signed)
Reason for Consult:Right hip fx Referring Physician: Sydnee Lamour is an 83 y.o. female.  HPI: Jamie [name] was at home alone when she fell sometime between Tuesday night and Wednesday morning. She was found when the sitter who stays with her came that morning. She was taken to [facility] and diagnosed with a right subtroch femur fracture and transferred to [facility] for further treatment. The patient is moderately demented and cannot contribute to history. Above is garnered from chart and family in room.  Past Medical History:  Diagnosis Date  . Adenomatous colon polyp 12/1993  . Allergy    lipitor  . Anxiety   . COPD (chronic obstructive pulmonary disease) ([place])   . Dementia   . Diverticulosis   . Dysphagia    to solids and pills .  On left side  . Esophageal cancer ([place]) 05/15/12   bx=, middle,Invasive squamous cell ca,  . Hot flashes   . Hyperlipidemia   . Hypertension   . Mitral valve insufficiency   . Osteoporosis     Past Surgical History:  Procedure Laterality Date  . ABDOMINAL HYSTERECTOMY  3557,3220   salpingo-opherctomy 1986  . ANTERIOR AND POSTERIOR VAGINAL REPAIR W/ SACROSPINOUS LIGAMENT SUSPENSION    . Biopsy of Esophagus  05/15/12   Invasive Squamous Cell Carcinoma  . colon polyps     hx adenomatous  . COLONOSCOPY  2008   neg  . EUS  06/03/2012   Procedure: UPPER ENDOSCOPIC ULTRASOUND (EUS) LINEAR;  Surgeon: Milus Banister, MD;  Location: [facility];  Service: Endoscopy;  Laterality: N/A;  +/- radial and liner   . EXPLORATORY LAPAROTOMY  1985  . orif left elbow  05/13/08   s/p fall, olecranon fracture  . TONSILLECTOMY    . TUBAL LIGATION      Family History  Problem Relation Age of Onset  . CVA Mother   . Lung cancer Mother   . Alzheimer's disease Mother   . Cancer Mother   . Kidney failure Father   . Colon polyps Daughter     Social History:  reports that she has been smoking Cigarettes.  She has a 15.00 pack-year smoking history.  She has never used smokeless tobacco. She reports that she does not drink alcohol or use drugs.  Allergies:  Allergies  Allergen Reactions  . Lipitor [Atorvastatin]     Aching, musculoskeletal    Medications: I have reviewed the patient's current medications.  Results for orders placed or performed during the hospital encounter of 09/13/16 (from the past 48 hour(s))  MRSA PCR Screening     Status: None   Collection Time: 09/14/16 12:10 AM  Result Value Ref Range   MRSA by PCR NEGATIVE NEGATIVE    Comment:        The GeneXpert MRSA Assay (FDA approved for NASAL specimens only), is one component of a comprehensive MRSA colonization surveillance program. It is not intended to diagnose MRSA infection nor to guide or monitor treatment for MRSA infections.   CBC     Status: Abnormal   Collection Time: 09/14/16  7:48 AM  Result Value Ref Range   WBC 8.5 4.0 - 10.5 K/uL   RBC 3.18 (L) 3.87 - 5.11 MIL/uL   Hemoglobin 9.5 (L) 12.0 - 15.0 g/dL   HCT 29.4 (L) 36.0 - 46.0 %   MCV 92.5 78.0 - 100.0 fL   MCH 29.9 26.0 - 34.0 pg   MCHC 32.3 30.0 - 36.0 g/dL  RDW 13.3 11.5 - 15.5 %   Platelets 158 150 - 400 K/uL    No results found.  Review of Systems  Unable to perform ROS: Dementia  Musculoskeletal: Positive for joint pain.   Blood pressure (!) 134/44, pulse 70, temperature 98.2 F (36.8 C), temperature source Axillary, resp. rate 18, SpO2 95 %. Physical Exam  Constitutional: She appears well-developed and well-nourished. No distress.  HENT:  Head: Normocephalic and atraumatic.  Eyes: Conjunctivae are normal. Right eye exhibits no discharge. Left eye exhibits no discharge. No scleral icterus.  Neck: Neck supple.  Cardiovascular: Normal rate, regular rhythm and normal heart sounds.  Exam reveals no gallop and no friction rub.   No murmur heard. Respiratory: Effort normal and breath sounds normal. No respiratory distress. She has no wheezes. She has no rales.  GI: Soft.   Musculoskeletal:  Bilateral shoulder, elbow, wrist, digits- no skin wounds, nontender, no instability, no blocks to motion  Sens  Ax/R/M/U intact  Mot   Ax/ R/ PIN/ M/ AIN/ U intact  Rad 2+  LLE No traumatic wounds, ecchymosis, or rash  TTP about knee, mild  No effusions  Knee stable to varus/ valgus and anterior/posterior stress  Sens DPN, SPN, TN intact  Motor EHL, ext, flex, evers 5/5  DP 2+, PT 2+, No significant edema   RLE No traumatic wounds, ecchymosis, or rash  No effusions  Knee stable to varus/ valgus and anterior/posterior stress  Sens DPN, SPN, TN intact  Motor EHL, ext, flex, evers 5/5  DP 2+, PT 2+, No significant edema   Subjective portions of exam are limited by patient's dementia  Lymphadenopathy:    She has no cervical adenopathy.  Neurological: She is alert.  Skin: Skin is warm and dry. She is not diaphoretic.  Psychiatric: She has a normal mood and affect.    Assessment/Plan: Fall Right subtroch femur fx -- For IMN right femur by Dr. Mardelle Matte this afternoon. Please keep NPO until then. Will need PT/OT following that and then likely SNF placement for rehab. Osteoporosis Dementia     Lisette Abu, PA-C Orthopedic Surgery [phone] 09/14/2016, 9:07 AM    Seen and agree with above.    The risks benefits and alternatives were discussed with the patient and family including but not limited to the risks of nonoperative treatment, versus surgical intervention including infection, bleeding, nerve injury, malunion, nonunion, the need for revision surgery, hardware prominence, hardware failure, the need for hardware removal, blood clots, cardiopulmonary complications, morbidity, mortality, among others, and they were willing to proceed.

## 2016-09-14 NOTE — Discharge Instructions (Signed)

## 2016-09-14 NOTE — Progress Notes (Signed)
PROGRESS NOTE    Jamie [name]  QBH:[id number] DOB: October 09, 1928 DOA: 09/13/2016 PCP: Glenda Chroman, MD  Brief Narrative:Jamie [name] is a 83 y.o. female with medical history significant of dementia, COPD, esophageal cancer believed to be in remission after treatment 4 years ago.  Patient presents to the ED at [facility] following a mechanical fall at home.  She normally ambulates without a walker or cane , Was found down at home and brought in to ED at [facility] with severe hip pain and deformity. ED Course: R hip subtroch fracture with avulsion of lesser trochanter.  Labs, UA, and CXR are unremarkable apparently per report.    Assessment & Plan: 1. Closed subtrochanteric femur fx - -plan for OR today -she is low cardiac risk for an intermediate risk procedure -continue metoprolol -add DVT proph post OP -PT/OT, will need rehab  2.  Dementia -  3. H/o esophageal CA -felt to be in remission -FU with Oncology  4. HTN - continue metoprolol  DVT prophylaxis: SCDs Code Status: Full Family Communication: None at bedside Disposition Plan: SNF in few days if stable   Consultants:   Ortho Dr.Landau   Procedures:    Subjective: Feels ok, remains confused, no issues  Objective: Vitals:   09/13/16 2010 09/14/16 0034 09/14/16 0550 09/14/16 1535  BP: (!) 153/48 (!) 146/57 (!) 134/44   Pulse: 71 73 70   Resp: 18 18 18    Temp: 98.3 F (36.8 C) 98.1 F (36.7 C) 98.2 F (36.8 C) (P) 97.2 F (36.2 C)  TempSrc: Axillary Axillary Axillary   SpO2: 98% 98% 95%     Intake/Output Summary (Last 24 hours) at 09/14/16 1538 Last data filed at 09/14/16 1500  Gross per 24 hour  Intake              250 ml  Output              255 ml  Net               -5 ml   There were no vitals filed for this visit.  Examination:  General exam: Appears calm and comfortable, pleasantly consfused  Respiratory system: Clear to auscultation. Respiratory effort  normal. Cardiovascular system: S1 & S2 heard, RRR. No JVD, murmurs, rubs Gastrointestinal system: Abdomen is nondistended, soft and nontender. Normal bowel sounds heard. Central nervous system: Alert and oriented to self only Extremities: RLE shortened Skin: No rashes, lesions or ulcers Psychiatry: confused    Data Reviewed:   CBC:  Recent Labs Lab 09/14/16 0748  WBC 8.5  HGB 9.5*  HCT 29.4*  MCV 92.5  PLT 097   Basic Metabolic Panel:  Recent Labs Lab 09/14/16 0748  NA 139  K 3.6  CL 109  CO2 26  GLUCOSE 110*  BUN 19  CREATININE 0.90  CALCIUM 7.6*   GFR: CrCl cannot be calculated (Unknown ideal weight.). Liver Function Tests: No results for input(s): AST, ALT, ALKPHOS, BILITOT, PROT, ALBUMIN in the last 168 hours. No results for input(s): LIPASE, AMYLASE in the last 168 hours. No results for input(s): AMMONIA in the last 168 hours. Coagulation Profile: No results for input(s): INR, PROTIME in the last 168 hours. Cardiac Enzymes: No results for input(s): CKTOTAL, CKMB, CKMBINDEX, TROPONINI in the last 168 hours. BNP (last 3 results) No results for input(s): PROBNP in the last 8760 hours. HbA1C: No results for input(s): HGBA1C in the last 72 hours. CBG: No results for input(s): GLUCAP in  the last 168 hours. Lipid Profile: No results for input(s): CHOL, HDL, LDLCALC, TRIG, CHOLHDL, LDLDIRECT in the last 72 hours. Thyroid Function Tests: No results for input(s): TSH, T4TOTAL, FREET4, T3FREE, THYROIDAB in the last 72 hours. Anemia Panel: No results for input(s): VITAMINB12, FOLATE, FERRITIN, TIBC, IRON, RETICCTPCT in the last 72 hours. Urine analysis: No results found for: COLORURINE, APPEARANCEUR, LABSPEC, PHURINE, GLUCOSEU, HGBUR, BILIRUBINUR, KETONESUR, PROTEINUR, UROBILINOGEN, NITRITE, LEUKOCYTESUR Sepsis Labs: @LABRCNTIP (procalcitonin:4,lacticidven:4)  ) Recent Results (from the past 240 hour(s))  MRSA PCR Screening     Status: None   Collection  Time: 09/14/16 12:10 AM  Result Value Ref Range Status   MRSA by PCR NEGATIVE NEGATIVE Final    Comment:        The GeneXpert MRSA Assay (FDA approved for NASAL specimens only), is one component of a comprehensive MRSA colonization surveillance program. It is not intended to diagnose MRSA infection nor to guide or monitor treatment for MRSA infections.          Radiology Studies: Dg C-arm 1-60 Min  Result Date: 09/14/2016 CLINICAL DATA:  Fracture fixation EXAM: DG C-ARM 61-120 MIN; RIGHT FEMUR 2 VIEWS COMPARISON:  09/13/2012 FINDINGS: Intertrochanteric fracture has been fixed with compression screw and locking intramedullary rod extending into the distal femur. Fracture alignment appears satisfactory. Hardware positioning appears satisfactory. IMPRESSION: Fracture fixation of intertrochanteric fracture. Electronically Signed   By: Franchot Gallo M.D.   On: 09/14/2016 15:32   Dg Femur, Min 2 Views Right  Result Date: 09/14/2016 CLINICAL DATA:  Fracture fixation EXAM: DG C-ARM 61-120 MIN; RIGHT FEMUR 2 VIEWS COMPARISON:  09/13/2012 FINDINGS: Intertrochanteric fracture has been fixed with compression screw and locking intramedullary rod extending into the distal femur. Fracture alignment appears satisfactory. Hardware positioning appears satisfactory. IMPRESSION: Fracture fixation of intertrochanteric fracture. Electronically Signed   By: Franchot Gallo M.D.   On: 09/14/2016 15:32        Scheduled Meds: . [MAR Hold] calcitRIOL  0.5 mcg Oral Daily  . chlorhexidine  60 mL Topical Once  . [MAR Hold] cholecalciferol  400 Units Oral Daily  . [MAR Hold] citalopram  20 mg Oral Daily  . [MAR Hold] enoxaparin (LOVENOX) injection  30 mg Subcutaneous Q24H  . [START ON 09/15/2016] feeding supplement (ENSURE ENLIVE)  237 mL Oral BID BM  . [MAR Hold] metoprolol succinate  25 mg Oral Daily  . povidone-iodine  2 application Topical Once  . [MAR Hold] risperiDONE  0.25 mg Oral Daily   Continuous  Infusions: . sodium chloride 75 mL/hr at 09/13/16 2158  . lactated ringers 10 mL/hr at 09/14/16 1252     LOS: 1 day    Time spent:64min    Domenic Polite, MD [facility] Pager [phone]  If 7PM-7AM, please contact night-coverage [email or web address] Password [facility] 09/14/2016, 3:38 PM

## 2016-09-14 NOTE — Transfer of Care (Signed)
Immediate Anesthesia Transfer of Care Note  Patient: Jamie [name]  Procedure(s) Performed: Procedure(s): INTRAMEDULLARY (IM) NAIL FEMORAL (Right)  Patient Location: PACU  Anesthesia Type:General  Level of Consciousness: sedated  Airway & Oxygen Therapy: Patient Spontanous Breathing and Patient connected to nasal cannula oxygen  Post-op Assessment: Report given to RN and Post -op Vital signs reviewed and stable  Post vital signs: Reviewed and stable  Last Vitals:  Vitals:   09/14/16 0550 09/14/16 1535  BP: (!) 134/44   Pulse: 70   Resp: 18   Temp: 36.8 C (P) 36.2 C    Last Pain:  Vitals:   09/14/16 1140  TempSrc:   PainSc: 3          Complications: No apparent anesthesia complications

## 2016-09-14 NOTE — Progress Notes (Signed)
Initial Nutrition Assessment  DOCUMENTATION CODES:   Not applicable  INTERVENTION:  Once diet advances, provide Ensure Enlive po BID, each supplement provides 350 kcal and 20 grams of protein.  Recommend obtaining new weight to fully assess weight trends.  NUTRITION DIAGNOSIS:   Increased nutrient needs related to  (post op healing) as evidenced by estimated needs.  GOAL:   Patient will meet greater than or equal to 90% of their needs  MONITOR:   Diet advancement, Supplement acceptance, Labs, Weight trends, Skin, I & O's  REASON FOR ASSESSMENT:   Consult Hip fracture protocol  ASSESSMENT:   83 y.o. female with medical history significant of dementia, esophageal cancer believed to be in remission after treatment 4 years ago.  Patient presents after a mechanical fall at home. R hip subtroch fracture with avulsion of lesser trochanter.  Pt is currently NPO for surgery today. Pt was a little agitated as she had hand mitts on and wanted them removed. Family at bedside. They assured pt to keep the mitts on as she has been trying to pull on her IV. Noted pt with dementia. Family reports pt was eating well PTA with usual consumption of at least 2-3 meals and an Ensure once daily. Weight reported to be stable at ~130 lbs. Noted no new weight recorded. Recommend obtaining new weight to fully assess weight trends. RD to order Ensure once diet advances to aid in post op healing.   Nutrition-Focused physical exam completed. Findings are no fat depletion, mild to moderate muscle depletion, and no edema. Depletion likely related to the natural aging process.  Labs and medications reviewed.   Diet Order:  Diet NPO time specified Except for: Sips with Meds  Skin:   (Incision R leg)  Last BM:  4/4  Height:   Ht Readings from Last 1 Encounters:  03/05/15 5\' 1"  (1.549 m)    Weight:   Wt Readings from Last 1 Encounters:  03/05/15 131 lb 14.4 oz (59.8 kg)    Ideal Body Weight:  47.7  kg  BMI:  There is no height or weight on file to calculate BMI.  Estimated Nutritional Needs:   Kcal:  1500-1700  Protein:  65-75 grams  Fluid:  >/= 1.5 L/day  EDUCATION NEEDS:   No education needs identified at this time  Corrin Parker, MS, RD, LDN Pager # [phone] After hours/ weekend pager # [phone]

## 2016-09-15 ENCOUNTER — Encounter (HOSPITAL_COMMUNITY): Payer: Self-pay | Admitting: Orthopedic Surgery

## 2016-09-15 LAB — BASIC METABOLIC PANEL
ANION GAP: 8 (ref 5–15)
BUN: 17 mg/dL (ref 6–20)
CALCIUM: 7.5 mg/dL — AB (ref 8.9–10.3)
CO2: 26 mmol/L (ref 22–32)
CREATININE: 0.95 mg/dL (ref 0.44–1.00)
Chloride: 105 mmol/L (ref 101–111)
GFR, EST NON AFRICAN AMERICAN: 53 mL/min — AB (ref >60)
GLUCOSE: 111 mg/dL — AB (ref 65–99)
Potassium: 3.6 mmol/L (ref 3.5–5.1)
Sodium: 139 mmol/L (ref 135–145)

## 2016-09-15 LAB — CBC
HEMATOCRIT: 24.8 % — AB (ref 36.0–46.0)
Hemoglobin: 8.2 g/dL — ABNORMAL LOW (ref 12.0–15.0)
MCH: 31.2 pg (ref 26.0–34.0)
MCHC: 33.1 g/dL (ref 30.0–36.0)
MCV: 94.3 fL (ref 78.0–100.0)
PLATELETS: 152 10*3/uL (ref 150–400)
RBC: 2.63 MIL/uL — ABNORMAL LOW (ref 3.87–5.11)
RDW: 13.5 % (ref 11.5–15.5)
WBC: 8.7 10*3/uL (ref 4.0–10.5)

## 2016-09-15 LAB — PREPARE RBC (CROSSMATCH)

## 2016-09-15 MED ORDER — LORAZEPAM 0.5 MG PO TABS
0.5000 mg | ORAL_TABLET | Freq: Three times a day (TID) | ORAL | Status: DC | PRN
  Administered 2016-09-15: 0.5 mg via ORAL
  Filled 2016-09-15: qty 1

## 2016-09-15 MED ORDER — MORPHINE SULFATE (PF) 2 MG/ML IV SOLN
1.0000 mg | INTRAVENOUS | Status: DC | PRN
  Administered 2016-09-18 – 2016-09-20 (×3): 1 mg via INTRAVENOUS
  Filled 2016-09-15 (×4): qty 1

## 2016-09-15 MED ORDER — SODIUM CHLORIDE 0.9 % IV SOLN: Freq: Once | INTRAVENOUS | Status: DC

## 2016-09-15 NOTE — Progress Notes (Signed)
Pt restless and figity, family asking if she can have some kind of med to calm her. Pt has had tylenol for pain today. Pt has dementia, and restlessness has become more this evening. Dr Broadus John paged to inform of pt request, pt has no current order.

## 2016-09-15 NOTE — Progress Notes (Signed)
OT Cancellation Note  Patient Details Name: Jamie [name] MRN: [id number] DOB: 1928-10-26   Cancelled Treatment:    Reason Eval/Treat Not Completed: Medical issues which prohibited therapy (decr BP see vitals) Pt receiving 1 unit of blood at this time and noted to have low BP with map (52) at this time. Attempted some assisted bed level activity and BP rechecked and remained low. OT to hold at this time to await increase in BP for EOB sitting. Son present at the end of session and educated. Pt noted to have "tunnel" vision and does not visually attend to staff in the peripheral vision. Pt unable to state name or dob when asked reflecting advanced level of dementia currently.   Vonita Moss   OTR/L Pager: [phone] Office: [phone] .  09/15/2016, 3:00 PM

## 2016-09-15 NOTE — Evaluation (Signed)
Clinical/Bedside Swallow Evaluation Patient Details  Name: Jamie [name] MRN: [id number] Date of Birth: May 05, 1929  Today's Date: 09/15/2016 Time: SLP Start Time (ACUTE ONLY): 1148 SLP Stop Time (ACUTE ONLY): 1201 SLP Time Calculation (min) (ACUTE ONLY): 13 min  Past Medical History:  Past Medical History:  Diagnosis Date  . Adenomatous colon polyp 12/1993  . Allergy    lipitor  . Anxiety   . COPD (chronic obstructive pulmonary disease) ([place])   . Dementia   . Diverticulosis   . Dysphagia    to solids and pills .  On left side  . Esophageal cancer (Jamie [name]) 05/15/12   bx=, middle,Invasive squamous cell ca,  . Hot flashes   . Hyperlipidemia   . Hypertension   . Mitral valve insufficiency   . Osteoporosis    Past Surgical History:  Past Surgical History:  Procedure Laterality Date  . ABDOMINAL HYSTERECTOMY  7035,0093   salpingo-opherctomy 1986  . ANTERIOR AND POSTERIOR VAGINAL REPAIR W/ SACROSPINOUS LIGAMENT SUSPENSION    . Biopsy of Esophagus  05/15/12   Invasive Squamous Cell Carcinoma  . colon polyps     hx adenomatous  . COLONOSCOPY  2008   neg  . EUS  06/03/2012   Procedure: UPPER ENDOSCOPIC ULTRASOUND (EUS) LINEAR;  Surgeon: Milus Banister, MD;  Location: [facility];  Service: Endoscopy;  Laterality: N/A;  +/- radial and liner   . EXPLORATORY LAPAROTOMY  1985  . orif left elbow  05/13/08   s/p fall, olecranon fracture  . TONSILLECTOMY    . TUBAL LIGATION     HPI:  Jamie A Hiltonis a 83 y.o.femalewith medical history significant of dementia, esophageal cancer believed to be in remission after treatment 4 years ago. Patient presents to the ED at [name] following a mechanical fall at home. She normally ambulates without a walker or cane (although family says she probably shouldn't be). Was found down at home and brought in to ED at [name] with severe hip pain and deformity.   Assessment / Plan / Recommendation Clinical Impression  Pts  swallow presents within functional limits. Note hx of esophageal cancer s/p radiation and chemotherapy 4 years ago. Daughter reports pt to have inital swallow difficulty around cancer diagnosis but following tx symptoms have resolved. No overt s/sx of aspiration with any PO. Recommend regular thin diet with standard aspiration precautions and assitance/supervision at all meals given progression of pts dementia. No further ST needs identified.          Aspiration Risk  Mild aspiration risk    Diet Recommendation  Regular, thin liquids    Medication Administration: Whole meds with liquid    Other  Recommendations Oral Care Recommendations: Oral care BID   Follow up Recommendations 24 hour supervision/assistance      Frequency and Duration            Prognosis        Swallow Study   General Date of Onset: 09/13/16 HPI: Jamie A Hiltonis a 83 y.o.femalewith medical history significant of dementia, esophageal cancer believed to be in remission after treatment 4 years ago. Patient presents to the ED at [name] following a mechanical fall at home. She normally ambulates without a walker or cane (although family says she probably shouldn't be). Was found down at home and brought in to ED at [name] with severe hip pain and deformity. Type of Study: Bedside Swallow Evaluation Previous Swallow Assessment: none prior Diet Prior to this Study: Thin liquids;Regular  Temperature Spikes Noted: No Respiratory Status: Room air History of Recent Intubation: Yes Length of Intubations (days): 1 days (for procedure) Date extubated: 09/14/16 Behavior/Cognition: Alert;Confused Oral Cavity Assessment: Within Functional Limits Oral Cavity - Dentition: Adequate natural dentition Self-Feeding Abilities: Needs set up Patient Positioning: Upright in bed Baseline Vocal Quality: Low vocal intensity Volitional Cough: Strong Volitional Swallow: Unable to elicit    Oral/Motor/Sensory  Function Overall Oral Motor/Sensory Function: Within functional limits   Ice Chips Ice chips: Not tested   Thin Liquid Thin Liquid: Within functional limits    Nectar Thick Nectar Thick Liquid: Not tested   Honey Thick Honey Thick Liquid: Not tested   Puree Puree: Within functional limits   Solid   GO   Solid: Within functional limits       Arvil Chaco MA, CCC-SLP Acute Care Speech Language Pathologist    Levi Aland 09/15/2016,12:23 PM

## 2016-09-15 NOTE — Progress Notes (Addendum)
PROGRESS NOTE    Jamie [name]  KXF:[id number] DOB: [birth year] DOA: 09/13/2016 PCP: Glenda Chroman, MD  Brief Narrative:Jamie [name] is a 83 y.o. female with medical history significant of dementia, COPD, esophageal cancer believed to be in remission after treatment 4 years ago.  Patient presents to the ED at [facility] following a mechanical fall at home.  She normally ambulates without a walker or cane , Was found down at home and brought in to ED at [facility] with severe hip pain and deformity. ED Course: R hip subtroch fracture with avulsion of lesser trochanter.  Labs, UA, and CXR are unremarkable apparently per report.   Assessment & Plan: 1. Closed subtrochanteric femur fx - -s/p IM Nail 4/5 -she is low cardiac risk for an intermediate risk procedure -continue metoprolol -lovenox for DVT prophylaxis -PT/OT, will need rehab  2. Acute Blood loss anemia -transfuse 1 unit PRBC  3.  Dementia  -moderate, confused at baseline  4. H/o esophageal CA -felt to be in remission -FU with Oncology  5. HTN - continue metoprolol  DVT prophylaxis: SCDs Code Status: Full Family Communication: daughter at bedside Disposition Plan: SNF in few days if stable   Consultants:   Ortho Dr.Landau   Procedures:    Subjective: Feels ok, remains confused, no issues, tolerated surgery yesterday  Objective: Vitals:   09/15/16 0437 09/15/16 1311 09/15/16 1339 09/15/16 1343  BP: (!) 137/52 (!) 102/40 (!) 98/36 (!) 98/37  Pulse: 72 73 75 74  Resp: 20 20  18   Temp: 98.5 F (36.9 C) 97.9 F (36.6 C)  98.3 F (36.8 C)  TempSrc: Oral Oral  Oral  SpO2: 100% 95%  95%    Intake/Output Summary (Last 24 hours) at 09/15/16 1515 Last data filed at 09/15/16 1328  Gross per 24 hour  Intake             1620 ml  Output              450 ml  Net             1170 ml   There were no vitals filed for this visit.  Examination:  General exam: Appears calm and comfortable,  pleasantly consfused  Respiratory system: Clear to auscultation. Respiratory effort normal. Cardiovascular system: S1 & S2 heard, RRR. No JVD, murmurs, rubs Gastrointestinal system: Abdomen is nondistended, soft and nontender. Normal bowel sounds heard. Central nervous system: Alert and oriented to self only, confused Extremities: RLE shortened Skin: No rashes, lesions or ulcers Psychiatry: confused    Data Reviewed:   CBC:  Recent Labs Lab 09/14/16 0748 09/15/16 0505  WBC 8.5 8.7  HGB 9.5* 8.2*  HCT 29.4* 24.8*  MCV 92.5 94.3  PLT 158 696   Basic Metabolic Panel:  Recent Labs Lab 09/14/16 0748 09/15/16 0505  NA 139 139  K 3.6 3.6  CL 109 105  CO2 26 26  GLUCOSE 110* 111*  BUN 19 17  CREATININE 0.90 0.95  CALCIUM 7.6* 7.5*   GFR: CrCl cannot be calculated (Unknown ideal weight.). Liver Function Tests: No results for input(s): AST, ALT, ALKPHOS, BILITOT, PROT, ALBUMIN in the last 168 hours. No results for input(s): LIPASE, AMYLASE in the last 168 hours. No results for input(s): AMMONIA in the last 168 hours. Coagulation Profile: No results for input(s): INR, PROTIME in the last 168 hours. Cardiac Enzymes: No results for input(s): CKTOTAL, CKMB, CKMBINDEX, TROPONINI in the last 168 hours. BNP (last 3 results) No  results for input(s): PROBNP in the last 8760 hours. HbA1C: No results for input(s): HGBA1C in the last 72 hours. CBG: No results for input(s): GLUCAP in the last 168 hours. Lipid Profile: No results for input(s): CHOL, HDL, LDLCALC, TRIG, CHOLHDL, LDLDIRECT in the last 72 hours. Thyroid Function Tests: No results for input(s): TSH, T4TOTAL, FREET4, T3FREE, THYROIDAB in the last 72 hours. Anemia Panel: No results for input(s): VITAMINB12, FOLATE, FERRITIN, TIBC, IRON, RETICCTPCT in the last 72 hours. Urine analysis: No results found for: COLORURINE, APPEARANCEUR, LABSPEC, PHURINE, GLUCOSEU, HGBUR, BILIRUBINUR, KETONESUR, PROTEINUR, UROBILINOGEN,  NITRITE, LEUKOCYTESUR Sepsis Labs: @LABRCNTIP (procalcitonin:4,lacticidven:4)  ) Recent Results (from the past 240 hour(s))  MRSA PCR Screening     Status: None   Collection Time: 09/14/16 12:10 AM  Result Value Ref Range Status   MRSA by PCR NEGATIVE NEGATIVE Final    Comment:        The GeneXpert MRSA Assay (FDA approved for NASAL specimens only), is one component of a comprehensive MRSA colonization surveillance program. It is not intended to diagnose MRSA infection nor to guide or monitor treatment for MRSA infections.          Radiology Studies: Dg C-arm 1-60 Min  Result Date: 09/14/2016 CLINICAL DATA:  Fracture fixation EXAM: DG C-ARM 61-120 MIN; RIGHT FEMUR 2 VIEWS COMPARISON:  09/13/2012 FINDINGS: Intertrochanteric fracture has been fixed with compression screw and locking intramedullary rod extending into the distal femur. Fracture alignment appears satisfactory. Hardware positioning appears satisfactory. IMPRESSION: Fracture fixation of intertrochanteric fracture. Electronically Signed   By: Franchot Gallo M.D.   On: 09/14/2016 15:32   Dg Hip Port Unilat With Pelvis 1v Right  Result Date: 09/14/2016 CLINICAL DATA:  Right hip ORIF EXAM: DG HIP (WITH OR WITHOUT PELVIS) 1V PORT RIGHT COMPARISON:  None. FINDINGS: Comminuted right intertrochanteric fracture transfixed with an intramedullary nail and cannulated femoral neck screw without failure or complication. Lesser trochanter is medially and superiorly displaced. Generalized osteopenia.  No other fracture or dislocation. IMPRESSION: Interval ORIF of a right intertrochanteric fracture. Electronically Signed   By: Kathreen Devoid   On: 09/14/2016 16:07   Dg Femur, Min 2 Views Right  Result Date: 09/14/2016 CLINICAL DATA:  Fracture fixation EXAM: DG C-ARM 61-120 MIN; RIGHT FEMUR 2 VIEWS COMPARISON:  09/13/2012 FINDINGS: Intertrochanteric fracture has been fixed with compression screw and locking intramedullary rod extending into  the distal femur. Fracture alignment appears satisfactory. Hardware positioning appears satisfactory. IMPRESSION: Fracture fixation of intertrochanteric fracture. Electronically Signed   By: Franchot Gallo M.D.   On: 09/14/2016 15:32        Scheduled Meds: . sodium chloride   Intravenous Once  . calcitRIOL  0.5 mcg Oral Daily  . cholecalciferol  400 Units Oral Daily  . citalopram  20 mg Oral Daily  . docusate sodium  100 mg Oral BID  . enoxaparin (LOVENOX) injection  40 mg Subcutaneous Q24H  . feeding supplement (ENSURE ENLIVE)  237 mL Oral BID BM  . ferrous sulfate  325 mg Oral TID PC  . metoprolol succinate  25 mg Oral Daily  . risperiDONE  0.25 mg Oral Daily  . senna  1 tablet Oral BID   Continuous Infusions:    LOS: 2 days    Time spent:60min    Domenic Polite, MD [facility] Pager [phone]  If 7PM-7AM, please contact night-coverage [email or web address] Password [email or web address] 09/15/2016, 3:15 PM

## 2016-09-15 NOTE — Progress Notes (Signed)
Dr Broadus John returned call, order received for prn ativan.

## 2016-09-15 NOTE — Evaluation (Signed)
Physical Therapy Evaluation Patient Details Name: Jamie [name] MRN: [id number] DOB: January 27, 1929 Today's Date: 09/15/2016   History of Present Illness  83 y.o. female admitted to [facility] on 09/13/16 after falling for R hip fx s/p IM nail on 09/14/16.  Pt with significant PMHx of dementia, mitral valve insufficiency, HTN, esophageal CA, dysphasia, COPD, and ORIF L elbow (2009-from a fall).  Clinical Impression  Pt was able to stand with me at EOB x 2 with RW.  Her BPs were much improved after 1 unit of blood ( 126/47 MAP 68).  I think she would benefit from SNF level rehab at discharge and if there is a place that caters to dementia that would be helpful.   PT to follow acutely for deficits listed below.     Follow Up Recommendations SNF (one that specializes in dementia care?)    Equipment Recommendations  Rolling walker with 5" wheels    Recommendations for Other Services   NA    Precautions / Restrictions Precautions Precautions: Fall Restrictions Weight Bearing Restrictions: Yes RLE Weight Bearing: Weight bearing as tolerated      Mobility  Bed Mobility Overal bed mobility: Needs Assistance Bed Mobility: Sit to Supine;Supine to Sit     Supine to sit: HOB elevated;Max assist Sit to supine: Max assist   General bed mobility comments: Max assist to help progress right leg to EOB.  HOB maximally elevated and pt giving therapist at hug to get to sitting.  Draw pad used to control hips.  Pt more difficult to get back to supine from sitting because she is guarded and pushing to remain up when pain starts.  She was able to, with bed in full trendelenberg 1/2 bridge and use her arms to help pull herself up in the bed mod assist.   Transfers Overall transfer level: Needs assistance   Transfers: Sit to/from Stand Sit to Stand: Mod assist;From elevated surface         General transfer comment: Mod assist to stand from bed x2 pt pulling on RW so therapist had to stabilize RW and help  steady trunk assisting to power up.  Pt was able to get her feet up under her, but was unable to unweight her right hip enough with her arms to take steps.    Ambulation/Gait             General Gait Details: unable at this time.       Balance Overall balance assessment: Needs assistance Sitting-balance support: Feet supported;Bilateral upper extremity supported Sitting balance-Leahy Scale: Poor Sitting balance - Comments: Min to mod assist EOB as when pt feels pain in her right hip, she throws herself into extension.   Postural control: Posterior lean Standing balance support: Bilateral upper extremity supported Standing balance-Leahy Scale: Poor Standing balance comment: mod assist in standing x 2 for ~30 seconds each time.                              Pertinent Vitals/Pain Pain Assessment: Faces Faces Pain Scale: Hurts whole lot Pain Location: right leg Pain Descriptors / Indicators: Grimacing;Guarding Pain Intervention(s): Limited activity within patient's tolerance;Monitored during session;Repositioned;Premedicated before session (pre medicated with tylenol)    Home Living Family/patient expects to be discharged to:: Skilled nursing facility                      Prior Function Level of Independence: Independent  Comments: Per notes pt did not walk with RW or cane, but per family probably should have.         Extremity/Trunk Assessment   Upper Extremity Assessment Upper Extremity Assessment: Defer to OT evaluation    Lower Extremity Assessment Lower Extremity Assessment: RLE deficits/detail RLE Deficits / Details: right leg with normal post op pain and weakness, pt is very guarded on this side especially for hip and knee flexion ROM.  Ankle 3/5, knee limited assessment, but was able to do an assisted kick at EOB 1/5, hip 1/5 per gross functional assessment.  RLE: Unable to fully assess due to pain (limited by pain and dementia, pt  guarding. ) RLE Sensation:  (grossly intact to LT)    Cervical / Trunk Assessment Cervical / Trunk Assessment: Kyphotic  Communication   Communication: No difficulties  Cognition Arousal/Alertness: Awake/alert Behavior During Therapy: Restless Overall Cognitive Status: History of cognitive impairments - at baseline                                           Exercises Total Joint Exercises Ankle Circles/Pumps: AAROM;Both;20 reps Heel Slides: AROM;Right;10 reps;Other (comment) (limited by pt guarding) Hip ABduction/ADduction: AAROM;Left;10 reps;Other (comment) (less guarding than flexion) Long Arc Quad: AAROM;AROM;Right;Left;10 reps   Assessment/Plan    PT Assessment Patient needs continued PT services  PT Problem List Decreased strength;Decreased range of motion;Decreased activity tolerance;Decreased balance;Decreased mobility;Decreased cognition;Decreased knowledge of use of DME;Decreased knowledge of precautions;Decreased safety awareness;Pain       PT Treatment Interventions DME instruction;Gait training;Therapeutic activities;Functional mobility training;Therapeutic exercise;Balance training;Patient/family education;Manual techniques;Modalities    PT Goals (Current goals can be found in the Care Plan section)  Acute Rehab PT Goals Patient Stated Goal: unable to state PT Goal Formulation: Patient unable to participate in goal setting Time For Goal Achievement: 09/22/16 Potential to Achieve Goals: Good    Frequency Min 3X/week (would benefit from more)    End of Session Equipment Utilized During Treatment: Gait belt Activity Tolerance: Patient limited by pain Patient left: in bed;with call bell/phone within reach;with bed alarm set;with family/visitor present Nurse Communication: Mobility status PT Visit Diagnosis: Muscle weakness (generalized) (M62.81);Difficulty in walking, not elsewhere classified (R26.2);Pain Pain - Right/Left: Right Pain - part of  body: Hip    Time: 2549-8264 PT Time Calculation (min) (ACUTE ONLY): 32 min   Charges:   PT Evaluation $PT Eval Moderate Complexity: 1 Procedure PT Treatments $Therapeutic Activity: 8-22 mins       Khalel Alms B. [place], [place], DPT [phone]   09/15/2016, 5:00 PM

## 2016-09-15 NOTE — Progress Notes (Signed)
Patient ID: Jamie [name], female   DOB: 1928/10/02, 83 y.o.   MRN: [id number]     Subjective:  Patient reports pain as mild.  Patient demented and agitated.  Does follow commands.  Objective:   VITALS:   Vitals:   09/14/16 1719 09/14/16 2020 09/15/16 0007 09/15/16 0437  BP: (!) 139/50 (!) 115/50 (!) 115/47 (!) 137/52  Pulse: 74 71 77 72  Resp: 18 16 18 20   Temp: 97.8 F (36.6 C) 98.6 F (37 C) 98.2 F (36.8 C) 98.5 F (36.9 C)  TempSrc: Oral Axillary Axillary Oral  SpO2: 94% 98% 98% 100%    ABD soft Sensation intact distally Dorsiflexion/Plantar flexion intact Incision: dressing C/D/I and no drainage   Lab Results  Component Value Date   WBC 8.7 09/15/2016   HGB 8.2 (L) 09/15/2016   HCT 24.8 (L) 09/15/2016   MCV 94.3 09/15/2016   PLT 152 09/15/2016   BMET    Component Value Date/Time   NA 139 09/15/2016 0505   NA 141 07/05/2012 1220   K 3.6 09/15/2016 0505   K 4.0 07/05/2012 1220   CL 105 09/15/2016 0505   CL 102 07/05/2012 1220   CO2 26 09/15/2016 0505   CO2 28 07/05/2012 1220   GLUCOSE 111 (H) 09/15/2016 0505   GLUCOSE 92 07/05/2012 1220   BUN 17 09/15/2016 0505   BUN 12.8 07/05/2012 1220   CREATININE 0.95 09/15/2016 0505   CREATININE 0.8 07/05/2012 1220   CALCIUM 7.5 (L) 09/15/2016 0505   CALCIUM 9.5 07/05/2012 1220   GFRNONAA 53 (L) 09/15/2016 0505   GFRAA >60 09/15/2016 0505     Assessment/Plan: 1 Day Post-Op   Principal Problem:   Closed fracture of subtrochanteric section of femur, right, initial encounter ([place]) Active Problems:   History of esophageal cancer   Dementia   Advance diet Up with therapy  ABLA plan for 2 units PRBC Continue plan per medicine WBAT Dry dressing PRN    DOUGLAS PARRY, BRANDON 09/15/2016, 10:12 AM  Discussed and agree with above.  Marchia Bond, MD Cell [phone]

## 2016-09-16 LAB — CBC
HCT: 27.9 % — ABNORMAL LOW (ref 36.0–46.0)
Hemoglobin: 9.1 g/dL — ABNORMAL LOW (ref 12.0–15.0)
MCH: 29.5 pg (ref 26.0–34.0)
MCHC: 32.6 g/dL (ref 30.0–36.0)
MCV: 90.6 fL (ref 78.0–100.0)
PLATELETS: 164 10*3/uL (ref 150–400)
RBC: 3.08 MIL/uL — ABNORMAL LOW (ref 3.87–5.11)
RDW: 14.7 % (ref 11.5–15.5)
WBC: 7.2 10*3/uL (ref 4.0–10.5)

## 2016-09-16 LAB — BASIC METABOLIC PANEL
Anion gap: 11 (ref 5–15)
BUN: 12 mg/dL (ref 6–20)
CALCIUM: 7.7 mg/dL — AB (ref 8.9–10.3)
CO2: 25 mmol/L (ref 22–32)
Chloride: 105 mmol/L (ref 101–111)
Creatinine, Ser: 0.77 mg/dL (ref 0.44–1.00)
GFR calc Af Amer: >60 mL/min (ref >60)
GFR calc non Af Amer: >60 mL/min (ref >60)
Glucose, Bld: 113 mg/dL — ABNORMAL HIGH (ref 65–99)
POTASSIUM: 3.3 mmol/L — AB (ref 3.5–5.1)
Sodium: 141 mmol/L (ref 135–145)

## 2016-09-16 MED ORDER — LORAZEPAM 0.5 MG PO TABS
0.5000 mg | ORAL_TABLET | Freq: Four times a day (QID) | ORAL | Status: DC | PRN
  Administered 2016-09-16: 1 mg via ORAL
  Filled 2016-09-16: qty 2

## 2016-09-16 MED ORDER — POTASSIUM CHLORIDE CRYS ER 20 MEQ PO TBCR
40.0000 meq | EXTENDED_RELEASE_TABLET | Freq: Once | ORAL | Status: AC
  Administered 2016-09-16: 40 meq via ORAL
  Filled 2016-09-16: qty 2

## 2016-09-16 MED ORDER — LORAZEPAM 2 MG/ML IJ SOLN: 0.5000 mg | INTRAMUSCULAR | Status: DC | PRN

## 2016-09-16 NOTE — Plan of Care (Signed)
Problem: Safety: Goal: Ability to remain free from injury will improve Outcome: Progressing Safety precautions and fall preventions maintained  Problem: Pain Managment: Goal: General experience of comfort will improve Outcome: Progressing Medicated once for pain  Problem: Tissue Perfusion: Goal: Risk factors for ineffective tissue perfusion will decrease Outcome: Progressing SCD are on, no signs of DVT noted  Problem: Activity: Goal: Risk for activity intolerance will decrease Outcome: Progressing Tolerates bed activity  Problem: Bowel/Gastric: Goal: Will not experience complications related to bowel motility Outcome: Progressing No bowel problems reported

## 2016-09-16 NOTE — Progress Notes (Signed)
   Assessment: 2 Days Post-Op  S/P Procedure(s) (LRB): INTRAMEDULLARY (IM) NAIL FEMORAL (Right) by Dr. Sherolyn Buba on 09/14/16  Principal Problem:   Closed fracture of subtrochanteric section of femur, right, initial encounter [facility]) Active Problems:   History of esophageal cancer   Dementia Acute Blood Loss Anemia, Improved.  Hgb 9.1 from 8.2 after transfusion 09/15/16  Plan: Up with therapy Continue plan per medicine  Weight Bearing: Weight Bearing as Tolerated (WBAT)  Dressings: Dry dressing PRN Dispo: Per primary.  SNF.  Subjective: Patient reports pain as mild.  Demented, but follows commands.  Objective:   VITALS:   Vitals:   09/15/16 1614 09/15/16 1646 09/15/16 2034 09/16/16 0417  BP: (!) 124/50 (!) 126/47 (!) 126/49 (!) 158/58  Pulse: 70 70 78 74  Resp:  20 17   Temp:  100 F (37.8 C) 99.1 F (37.3 C) 100.2 F (37.9 C)  TempSrc:  Oral Oral Oral  SpO2:  95% 92% 96%   CBC Latest Ref Rng & Units 09/16/2016 09/15/2016 09/14/2016  WBC 4.0 - 10.5 K/uL 7.2 8.7 8.5  Hemoglobin 12.0 - 15.0 g/dL 9.1(L) 8.2(L) 9.5(L)  Hematocrit 36.0 - 46.0 % 27.9(L) 24.8(L) 29.4(L)  Platelets 150 - 400 K/uL 164 152 158   BMP Latest Ref Rng & Units 09/16/2016 09/15/2016 09/14/2016  Glucose 65 - 99 mg/dL 113(H) 111(H) 110(H)  BUN 6 - 20 mg/dL 12 17 19   Creatinine 0.44 - 1.00 mg/dL 0.77 0.95 0.90  Sodium 135 - 145 mmol/L 141 139 139  Potassium 3.5 - 5.1 mmol/L 3.3(L) 3.6 3.6  Chloride 101 - 111 mmol/L 105 105 109  CO2 22 - 32 mmol/L 25 26 26   Calcium 8.9 - 10.3 mg/dL 7.7(L) 7.5(L) 7.6(L)   Intake/Output      04/06 0701 - 04/07 0700 04/07 0701 - 04/08 0700   P.O. 960    I.V. 250    Blood 280    Total Intake 1490     Urine 1850 1000   Blood     Total Output 1850 1000   Net -360 -1000          Physical Exam: General: NAD.  Calm.  Follows commands MSK Neurovascularly intact Sensation intact distally Dorsiflexion/Plantar flexion intact Incision: dressing C/D/I   Prudencio Burly  III, PA-C 09/16/2016, 9:03 AM

## 2016-09-16 NOTE — Clinical Social Work Note (Signed)
CSW left voicemail for patient's daughter. Will discuss SNF placement once she calls back.  Dayton Scrape, [place]

## 2016-09-16 NOTE — Progress Notes (Signed)
Pt has had several episodes  of restlessness and agitation during the day and now is very agitated and pulling at iv and foley and family member is requesting that pt be put back on her previously d/c'd ativan or to add klonopin or xanax which she takes at home. Dr Broadus John notified and order received for ativan.  Family members updated.

## 2016-09-16 NOTE — Evaluation (Signed)
Occupational Therapy Evaluation Patient Details Name: Jamie [name] MRN: [id number] DOB: [birth year] Today's Date: 09/16/2016    History of Present Illness 83 y.o. female admitted to [facility] on 09/13/16 after falling for R hip fx s/p IM nail on 09/14/16.  Pt with significant PMHx of dementia, mitral valve insufficiency, HTN, esophageal CA, dysphasia, COPD, and ORIF L elbow (2009-from a fall).   Clinical Impression   Unsure of PLOF; pt unable to provide and no family present. Currently pt overall max-total assist for ADL. Pt required max assist for bed mobility and total assist for peri care following incontinent BM. Pt becoming agitated with movement and resisting further bed mobility at this time. Pt able to wash face with min assist at bed level. Recommending SNF for follow up to maximize independence and safety with ADL and functional mobility. Pt would benefit from continued skilled OT to address established goals.    Follow Up Recommendations  SNF;Supervision/Assistance - 24 hour    Equipment Recommendations  Other (comment) (TBD at next venue)    Recommendations for Other Services       Precautions / Restrictions Precautions Precautions: Fall Restrictions Weight Bearing Restrictions: Yes RLE Weight Bearing: Weight bearing as tolerated      Mobility Bed Mobility Overal bed mobility: Needs Assistance Bed Mobility: Rolling Rolling: Max assist         General bed mobility comments: Max assist for rolling to clean following incontinence. Use of bed pad to boost hips over; pt minimally assisting.  Transfers                      Balance                                           ADL either performed or assessed with clinical judgement   ADL Overall ADL's : Needs assistance/impaired   Eating/Feeding Details (indicate cue type and reason): RN tech reports she is having to assist with self feeding Grooming: Minimal assistance;Bed level;Wash/dry  face                       Toileting- Clothing Manipulation and Hygiene: Total assistance;Bed level;+2 for physical assistance Toileting - Clothing Manipulation Details (indicate cue type and reason): Max assist to roll; total assist for peri care after incontinent BM.       General ADL Comments: Pt currently max-total assist for ADL. Pt becoming agitated and very tearful with movement rolling in bed. Pt resisting any further bed mobility.     Vision         Perception     Praxis      Pertinent Vitals/Pain Pain Assessment: Faces Faces Pain Scale: Hurts whole lot Pain Location: R LE Pain Descriptors / Indicators: Grimacing;Guarding;Moaning;Crying Pain Intervention(s): Limited activity within patient's tolerance;Monitored during session;Repositioned     Hand Dominance     Extremity/Trunk Assessment Upper Extremity Assessment Upper Extremity Assessment: Generalized weakness   Lower Extremity Assessment Lower Extremity Assessment: Defer to PT evaluation       Communication Communication Communication: No difficulties   Cognition Arousal/Alertness: Awake/alert Behavior During Therapy: Restless;Agitated Overall Cognitive Status: No family/caregiver present to determine baseline cognitive functioning  General Comments: Pt able to state name and nod yes to pain. Unable to answer any other questions this session. Pt becoming agitated with rolling in bed to clean.   General Comments       Exercises     Shoulder Instructions      Home Living Family/patient expects to be discharged to:: Skilled nursing facility                                        Prior Functioning/Environment          Comments: Unsure, pt unable to provide and no family present to confirm PLOF.        OT Problem List: Decreased strength;Decreased range of motion;Decreased activity tolerance;Impaired balance (sitting  and/or standing);Decreased cognition;Decreased safety awareness;Decreased knowledge of use of DME or AE;Decreased knowledge of precautions;Pain      OT Treatment/Interventions: Self-care/ADL training;Therapeutic exercise;Energy conservation;DME and/or AE instruction;Therapeutic activities;Patient/family education;Balance training    OT Goals(Current goals can be found in the care plan section) Acute Rehab OT Goals Patient Stated Goal: did not state OT Goal Formulation: Patient unable to participate in goal setting Time For Goal Achievement: 09/30/16 Potential to Achieve Goals: Fair ADL Goals Pt Will Perform Grooming: with min guard assist;sitting Pt Will Transfer to Toilet: with min assist;stand pivot transfer;bedside commode Additional ADL Goal #1: Pt will perform bed mobililty with min assist as precursor to ADL.  OT Frequency: Min 2X/week   Barriers to D/C:            Co-evaluation              End of Session Nurse Communication: Mobility status;Other (comment) (incontinent BM)  Activity Tolerance: Patient limited by pain Patient left: in bed;with call bell/phone within reach;with bed alarm set;with restraints reapplied  OT Visit Diagnosis: Other abnormalities of gait and mobility (R26.89);Muscle weakness (generalized) (M62.81)                Time: 2992-4268 OT Time Calculation (min): 14 min Charges:  OT General Charges $OT Visit: 1 Procedure OT Evaluation $OT Eval Moderate Complexity: 1 Procedure G-Codes:     Tishara Pizano A. Ulice Brilliant, M.S., OTR/L Pager: [place] 09/16/2016, 2:10 PM

## 2016-09-16 NOTE — Progress Notes (Signed)
PROGRESS NOTE    Jamie [name]  YTK:[id number] DOB: 1928/10/12 DOA: 09/13/2016 PCP: Glenda Chroman, MD  Brief Narrative:Jamie [name] is a 83 y.o. female with medical history significant of dementia, COPD, esophageal cancer believed to be in remission after treatment 4 years ago.  Patient presents to the ED at [facility] following a mechanical fall at home.  She normally ambulates without a walker or cane , Was found down at home and brought in to ED at [facility] with severe hip pain and deformity. ED Course: R hip subtroch fracture with avulsion of lesser trochanter.  Labs, UA, and CXR are unremarkable apparently per report.   Assessment & Plan: 1. Closed subtrochanteric femur fx - -s/p IM Nail 4/5 -tolerated procedure without complications -continue metoprolol -lovenox for DVT prophylaxis -PT/OT, CSW consulted for rehab  2. Acute Blood loss anemia -transfused 1 unit PRBC, hb 9 today, check CBC in am  3.  Dementia  -moderate, confused at baseline  4. H/o esophageal CA -felt to be in remission -FU with Oncology  5. HTN - continue metoprolol  DVT prophylaxis: SCDs Code Status: Full Family Communication: daughter at bedside Disposition Plan: SNF Monday if stable   Consultants:   Ortho Dr.Landau   Procedures:    Subjective: Feels ok, remains confused, no issues, eating ok  Objective: Vitals:   09/15/16 1646 09/15/16 2034 09/16/16 0417 09/16/16 1003  BP: (!) 126/47 (!) 126/49 (!) 158/58 135/76  Pulse: 70 78 74 80  Resp: 20 17    Temp: 100 F (37.8 C) 99.1 F (37.3 C) 100.2 F (37.9 C)   TempSrc: Oral Oral Oral   SpO2: 95% 92% 96%     Intake/Output Summary (Last 24 hours) at 09/16/16 1222 Last data filed at 09/16/16 0900  Gross per 24 hour  Intake             1250 ml  Output             2850 ml  Net            -1600 ml   There were no vitals filed for this visit.  Examination:  General exam: Appears calm and comfortable, pleasantly  consfused  Respiratory system: Clear to auscultation. Respiratory effort normal. Cardiovascular system: S1 & S2 heard, RRR. No JVD, murmurs, rubs Gastrointestinal system: Abdomen is nondistended, soft and nontender. Normal bowel sounds heard. Central nervous system: Alert and oriented to self only, confused Extremities: R hip dressing noted Skin: No rashes, lesions or ulcers Psychiatry: confused    Data Reviewed:   CBC:  Recent Labs Lab 09/14/16 0748 09/15/16 0505 09/16/16 0609  WBC 8.5 8.7 7.2  HGB 9.5* 8.2* 9.1*  HCT 29.4* 24.8* 27.9*  MCV 92.5 94.3 90.6  PLT 158 152 751   Basic Metabolic Panel:  Recent Labs Lab 09/14/16 0748 09/15/16 0505 09/16/16 0609  NA 139 139 141  K 3.6 3.6 3.3*  CL 109 105 105  CO2 26 26 25   GLUCOSE 110* 111* 113*  BUN 19 17 12   CREATININE 0.90 0.95 0.77  CALCIUM 7.6* 7.5* 7.7*   GFR: CrCl cannot be calculated (Unknown ideal weight.). Liver Function Tests: No results for input(s): AST, ALT, ALKPHOS, BILITOT, PROT, ALBUMIN in the last 168 hours. No results for input(s): LIPASE, AMYLASE in the last 168 hours. No results for input(s): AMMONIA in the last 168 hours. Coagulation Profile: No results for input(s): INR, PROTIME in the last 168 hours. Cardiac Enzymes: No results for  input(s): CKTOTAL, CKMB, CKMBINDEX, TROPONINI in the last 168 hours. BNP (last 3 results) No results for input(s): PROBNP in the last 8760 hours. HbA1C: No results for input(s): HGBA1C in the last 72 hours. CBG: No results for input(s): GLUCAP in the last 168 hours. Lipid Profile: No results for input(s): CHOL, HDL, LDLCALC, TRIG, CHOLHDL, LDLDIRECT in the last 72 hours. Thyroid Function Tests: No results for input(s): TSH, T4TOTAL, FREET4, T3FREE, THYROIDAB in the last 72 hours. Anemia Panel: No results for input(s): VITAMINB12, FOLATE, FERRITIN, TIBC, IRON, RETICCTPCT in the last 72 hours. Urine analysis: No results found for: COLORURINE, APPEARANCEUR,  LABSPEC, PHURINE, GLUCOSEU, HGBUR, BILIRUBINUR, KETONESUR, PROTEINUR, UROBILINOGEN, NITRITE, LEUKOCYTESUR Sepsis Labs: @LABRCNTIP (procalcitonin:4,lacticidven:4)  ) Recent Results (from the past 240 hour(s))  MRSA PCR Screening     Status: None   Collection Time: 09/14/16 12:10 AM  Result Value Ref Range Status   MRSA by PCR NEGATIVE NEGATIVE Final    Comment:        The GeneXpert MRSA Assay (FDA approved for NASAL specimens only), is one component of a comprehensive MRSA colonization surveillance program. It is not intended to diagnose MRSA infection nor to guide or monitor treatment for MRSA infections.          Radiology Studies: Dg C-arm 1-60 Min  Result Date: 09/14/2016 CLINICAL DATA:  Fracture fixation EXAM: DG C-ARM 61-120 MIN; RIGHT FEMUR 2 VIEWS COMPARISON:  09/13/2012 FINDINGS: Intertrochanteric fracture has been fixed with compression screw and locking intramedullary rod extending into the distal femur. Fracture alignment appears satisfactory. Hardware positioning appears satisfactory. IMPRESSION: Fracture fixation of intertrochanteric fracture. Electronically Signed   By: Franchot Gallo M.D.   On: 09/14/2016 15:32   Dg Hip Port Unilat With Pelvis 1v Right  Result Date: 09/14/2016 CLINICAL DATA:  Right hip ORIF EXAM: DG HIP (WITH OR WITHOUT PELVIS) 1V PORT RIGHT COMPARISON:  None. FINDINGS: Comminuted right intertrochanteric fracture transfixed with an intramedullary nail and cannulated femoral neck screw without failure or complication. Lesser trochanter is medially and superiorly displaced. Generalized osteopenia.  No other fracture or dislocation. IMPRESSION: Interval ORIF of a right intertrochanteric fracture. Electronically Signed   By: Kathreen Devoid   On: 09/14/2016 16:07   Dg Femur, Min 2 Views Right  Result Date: 09/14/2016 CLINICAL DATA:  Fracture fixation EXAM: DG C-ARM 61-120 MIN; RIGHT FEMUR 2 VIEWS COMPARISON:  09/13/2012 FINDINGS: Intertrochanteric fracture  has been fixed with compression screw and locking intramedullary rod extending into the distal femur. Fracture alignment appears satisfactory. Hardware positioning appears satisfactory. IMPRESSION: Fracture fixation of intertrochanteric fracture. Electronically Signed   By: Franchot Gallo M.D.   On: 09/14/2016 15:32        Scheduled Meds: . sodium chloride   Intravenous Once  . calcitRIOL  0.5 mcg Oral Daily  . cholecalciferol  400 Units Oral Daily  . citalopram  20 mg Oral Daily  . docusate sodium  100 mg Oral BID  . enoxaparin (LOVENOX) injection  40 mg Subcutaneous Q24H  . feeding supplement (ENSURE ENLIVE)  237 mL Oral BID BM  . ferrous sulfate  325 mg Oral TID PC  . metoprolol succinate  25 mg Oral Daily  . risperiDONE  0.25 mg Oral Daily  . senna  1 tablet Oral BID   Continuous Infusions:    LOS: 3 days    Time spent:48min    Domenic Polite, MD [facility] Pager [phone]  If 7PM-7AM, please contact night-coverage [email or web address] Password [facility] 09/16/2016, 12:22 PM

## 2016-09-16 NOTE — NC FL2 (Signed)
[place] LEVEL OF CARE SCREENING TOOL     IDENTIFICATION  Patient Name: Jamie [name] Birthdate: 28-Oct-1928 Sex: female Admission Date (Current Location): 09/13/2016  [facility] and [place] Number:  [employer] and Address:  [facility] [place], [place] [address], [place], [place] [address]      Provider Number: [id number]  Attending Physician Name and Address:  Domenic Polite, MD  Relative Name and Phone Number:       Current Level of Care: Hospital Recommended Level of Care: [place] Prior Approval Number:    Date Approved/Denied:   PASRR Number: [id number]  Discharge Plan: SNF    Current Diagnoses: Patient Active Problem List   Diagnosis Date Noted  . Closed fracture of subtrochanteric section of femur, right, initial encounter ([place]) 09/13/2016  . History of esophageal cancer 09/13/2016  . Dementia 09/13/2016  . Carcinoma of middle third of esophagus ([place]) 05/24/2012  . Dysphagia   . Esophageal cancer ([place]) 05/15/2012    Orientation RESPIRATION BLADDER Height & Weight     Self  Normal Indwelling catheter Weight:   Height:     BEHAVIORAL SYMPTOMS/MOOD NEUROLOGICAL BOWEL NUTRITION STATUS   (Restless, fidgety)  (Dementia) Continent Diet (Regular)  AMBULATORY STATUS COMMUNICATION OF NEEDS Skin   Extensive Assist Verbally Surgical wounds                       Personal Care Assistance Level of Assistance  Bathing, Feeding, Dressing Bathing Assistance: Maximum assistance Feeding assistance: Limited assistance Dressing Assistance: Maximum assistance     Functional Limitations Info  Sight, Hearing, Speech Sight Info: Adequate Hearing Info: Adequate Speech Info: Adequate    SPECIAL CARE FACTORS FREQUENCY  PT (By licensed PT), OT (By licensed OT)     PT Frequency: 5 x week OT Frequency: 5 x week            Contractures Contractures Info: Not present    Additional Factors Info  Code  Status, Allergies Code Status Info: Full Allergies Info: Codeine, Lipitor (Atorvastatin)           Current Medications (09/16/2016):  This is the current hospital active medication list Current Facility-Administered Medications  Medication Dose Route Frequency Provider Last Rate Last Dose  . 0.9 %  sodium chloride infusion   Intravenous Once Marchia Bond, MD      . acetaminophen (TYLENOL) tablet 650 mg  650 mg Oral Q6H PRN Marchia Bond, MD   650 mg at 09/15/16 1549   Or  . acetaminophen (TYLENOL) suppository 650 mg  650 mg Rectal Q6H PRN Marchia Bond, MD      . alum & mag hydroxide-simeth (MAALOX/MYLANTA) 200-200-20 MG/5ML suspension 30 mL  30 mL Oral Q4H PRN Marchia Bond, MD      . bisacodyl (DULCOLAX) suppository 10 mg  10 mg Rectal Daily PRN Marchia Bond, MD      . calcitRIOL (ROCALTROL) capsule 0.5 mcg  0.5 mcg Oral Daily Etta Quill, DO   0.5 mcg at 09/16/16 1004  . cholecalciferol (VITAMIN D) tablet 400 Units  400 Units Oral Daily Etta Quill, DO   400 Units at 09/16/16 1004  . citalopram (CELEXA) tablet 20 mg  20 mg Oral Daily Etta Quill, DO   20 mg at 09/16/16 1003  . docusate sodium (COLACE) capsule 100 mg  100 mg Oral BID Marchia Bond, MD   100 mg at 09/16/16 1005  . enoxaparin (LOVENOX)  injection 40 mg  40 mg Subcutaneous Q24H Marchia Bond, MD   40 mg at 09/16/16 1005  . feeding supplement (ENSURE ENLIVE) (ENSURE ENLIVE) liquid 237 mL  237 mL Oral BID BM Domenic Polite, MD   237 mL at 09/16/16 1150  . ferrous sulfate tablet 325 mg  325 mg Oral TID PC Marchia Bond, MD   325 mg at 09/16/16 1005  . HYDROcodone-acetaminophen (NORCO/VICODIN) 5-325 MG per tablet 1-2 tablet  1-2 tablet Oral Q6H PRN Marchia Bond, MD   1 tablet at 09/15/16 2108  . magnesium citrate solution 1 Bottle  1 Bottle Oral Once PRN Marchia Bond, MD      . menthol-cetylpyridinium (CEPACOL) lozenge 3 mg  1 lozenge Oral PRN Marchia Bond, MD       Or  . phenol (CHLORASEPTIC) mouth spray 1 spray   1 spray Mouth/Throat PRN Marchia Bond, MD      . metoprolol succinate (TOPROL-XL) 24 hr tablet 25 mg  25 mg Oral Daily Etta Quill, DO   25 mg at 09/16/16 1003  . morphine 2 MG/ML injection 1 mg  1 mg Intravenous Q2H PRN Domenic Polite, MD      . ondansetron [facility]) tablet 4 mg  4 mg Oral Q6H PRN Marchia Bond, MD       Or  . ondansetron [facility]) injection 4 mg  4 mg Intravenous Q6H PRN Marchia Bond, MD      . polyethylene glycol (MIRALAX / GLYCOLAX) packet 17 g  17 g Oral Daily PRN Marchia Bond, MD      . risperiDONE (RISPERDAL) tablet 0.25 mg  0.25 mg Oral Daily Etta Quill, DO   0.25 mg at 09/16/16 1003  . senna (SENOKOT) tablet 8.6 mg  1 tablet Oral BID Marchia Bond, MD   8.6 mg at 09/16/16 1006   Facility-Administered Medications Ordered in Other Encounters  Medication Dose Route Frequency Provider Last Rate Last Dose  . topical emolient (BIAFINE) emulsion   Topical Daily Kyung Rudd, MD         Discharge Medications: Please see discharge summary for a list of discharge medications.  Relevant Imaging Results:  Relevant Lab Results:   Additional Information SS#: [id number]  Candie Chroman, LCSW

## 2016-09-17 LAB — CBC
HEMATOCRIT: 26.7 % — AB (ref 36.0–46.0)
Hemoglobin: 8.7 g/dL — ABNORMAL LOW (ref 12.0–15.0)
MCH: 29.7 pg (ref 26.0–34.0)
MCHC: 32.6 g/dL (ref 30.0–36.0)
MCV: 91.1 fL (ref 78.0–100.0)
Platelets: 192 10*3/uL (ref 150–400)
RBC: 2.93 MIL/uL — ABNORMAL LOW (ref 3.87–5.11)
RDW: 14.4 % (ref 11.5–15.5)
WBC: 7.2 10*3/uL (ref 4.0–10.5)

## 2016-09-17 MED ORDER — MEMANTINE HCL 10 MG PO TABS
10.0000 mg | ORAL_TABLET | Freq: Every day | ORAL | Status: DC
  Administered 2016-09-17 – 2016-09-20 (×4): 10 mg via ORAL
  Filled 2016-09-17 (×4): qty 1

## 2016-09-17 MED ORDER — RISPERIDONE 0.5 MG PO TABS
0.2500 mg | ORAL_TABLET | Freq: Every day | ORAL | Status: DC
  Administered 2016-09-18 – 2016-09-19 (×2): 0.25 mg via ORAL
  Filled 2016-09-17 (×2): qty 1

## 2016-09-17 MED ORDER — LORAZEPAM 0.5 MG PO TABS
0.5000 mg | ORAL_TABLET | Freq: Four times a day (QID) | ORAL | Status: AC | PRN
  Administered 2016-09-17 – 2016-09-19 (×4): 1 mg via ORAL
  Filled 2016-09-17 (×4): qty 2

## 2016-09-17 MED ORDER — DONEPEZIL HCL 10 MG PO TABS
10.0000 mg | ORAL_TABLET | Freq: Every day | ORAL | Status: DC
  Administered 2016-09-17 – 2016-09-19 (×3): 10 mg via ORAL
  Filled 2016-09-17 (×3): qty 1

## 2016-09-17 NOTE — Progress Notes (Signed)
CSW went and spoke with patient and family to provide bed offers. Patient was accompanied by her two children (Ginger and [place]) and son-in-law Sonia Side. CSW spoke with patient's daughter Ginger and provided her with a list of accepting facilities. Per Ginger, the family is hopeful for placement into [facility] SNF or [facility]. Family reports if preferred agencies do not accept then they would like to go with [facility]. CSW informed family that weekday CSW will follow up regarding placement choice. Family was very appreciative of the services provided by CSW. No other needs were reported at this time.   CSW will continue to follow and provide support to patient and family while in [place], [place] Worker [facility] Ph: [phone]

## 2016-09-17 NOTE — Progress Notes (Signed)
PROGRESS NOTE    Jamie [name]  HYQ:[id number] DOB: 08-09-1928 DOA: 09/13/2016 PCP: Glenda Chroman, MD  Brief Narrative:Jamie [name] is a 83 y.o. female with medical history significant of dementia, COPD, esophageal cancer believed to be in remission after treatment 4 years ago.  Patient presents to the ED at [facility] following a mechanical fall at home.  She normally ambulates without a walker or cane , Was found down at home and brought in to ED at [facility] with severe hip pain and deformity. ED Course: R hip subtroch fracture with avulsion of lesser trochanter.  Labs, UA, and CXR are unremarkable apparently per report.   Assessment & Plan: 1. Closed subtrochanteric femur fx - -s/p IM Nail 4/5 -tolerated procedure without complications -continue metoprolol -lovenox for DVT prophylaxis -PT/OT, CSW consulted for rehab  2. Acute Blood loss anemia -transfused 1 unit PRBC 4/6 -monitor Hb  3.  Dementia with confusion/agitation -moderate, confused at baseline -continue home meds, takes clonopin PRN, now on ativan PRN -risperidone QHS -resumed Aricept/namenda  4. H/o esophageal CA -felt to be in remission -FU with Oncology  5. HTN - continue metoprolol  DVT prophylaxis: SCDs Code Status: Full Family Communication: daughter at bedside Disposition Plan: SNF Monday if stable   Consultants:   Ortho Dr.Landau   Procedures:    Subjective: Feels ok, remains confused, no issues, eating ok, BMs after laxatives  Objective: Vitals:   09/17/16 0100 09/17/16 0200 09/17/16 0546 09/17/16 1236  BP:   (!) 174/64 (!) 149/103  Pulse:   73 70  Resp: 18 16    Temp:   98.8 F (37.1 C)   TempSrc:   Oral   SpO2:   97%     Intake/Output Summary (Last 24 hours) at 09/17/16 1324 Last data filed at 09/17/16 0902  Gross per 24 hour  Intake              420 ml  Output              750 ml  Net             -330 ml   There were no vitals filed for this  visit.  Examination:  General exam: Appears calm and comfortable, pleasantly consfused  Respiratory system: Clear to auscultation. Respiratory effort normal. Cardiovascular system: S1 & S2 heard, RRR. No JVD, murmurs, rubs Gastrointestinal system: Abdomen is nondistended, soft and nontender. Normal bowel sounds heard. Central nervous system: Alert and oriented to self only, confused Extremities: R hip dressing noted Skin: No rashes, lesions or ulcers Psychiatry: confused    Data Reviewed:   CBC:  Recent Labs Lab 09/14/16 0748 09/15/16 0505 09/16/16 0609 09/17/16 0221  WBC 8.5 8.7 7.2 7.2  HGB 9.5* 8.2* 9.1* 8.7*  HCT 29.4* 24.8* 27.9* 26.7*  MCV 92.5 94.3 90.6 91.1  PLT 158 152 164 952   Basic Metabolic Panel:  Recent Labs Lab 09/14/16 0748 09/15/16 0505 09/16/16 0609  NA 139 139 141  K 3.6 3.6 3.3*  CL 109 105 105  CO2 26 26 25   GLUCOSE 110* 111* 113*  BUN 19 17 12   CREATININE 0.90 0.95 0.77  CALCIUM 7.6* 7.5* 7.7*   GFR: CrCl cannot be calculated (Unknown ideal weight.). Liver Function Tests: No results for input(s): AST, ALT, ALKPHOS, BILITOT, PROT, ALBUMIN in the last 168 hours. No results for input(s): LIPASE, AMYLASE in the last 168 hours. No results for input(s): AMMONIA in the last 168 hours. Coagulation  Profile: No results for input(s): INR, PROTIME in the last 168 hours. Cardiac Enzymes: No results for input(s): CKTOTAL, CKMB, CKMBINDEX, TROPONINI in the last 168 hours. BNP (last 3 results) No results for input(s): PROBNP in the last 8760 hours. HbA1C: No results for input(s): HGBA1C in the last 72 hours. CBG: No results for input(s): GLUCAP in the last 168 hours. Lipid Profile: No results for input(s): CHOL, HDL, LDLCALC, TRIG, CHOLHDL, LDLDIRECT in the last 72 hours. Thyroid Function Tests: No results for input(s): TSH, T4TOTAL, FREET4, T3FREE, THYROIDAB in the last 72 hours. Anemia Panel: No results for input(s): VITAMINB12, FOLATE,  FERRITIN, TIBC, IRON, RETICCTPCT in the last 72 hours. Urine analysis: No results found for: COLORURINE, APPEARANCEUR, LABSPEC, PHURINE, GLUCOSEU, HGBUR, BILIRUBINUR, KETONESUR, PROTEINUR, UROBILINOGEN, NITRITE, LEUKOCYTESUR Sepsis Labs: @LABRCNTIP (procalcitonin:4,lacticidven:4)  ) Recent Results (from the past 240 hour(s))  MRSA PCR Screening     Status: None   Collection Time: 09/14/16 12:10 AM  Result Value Ref Range Status   MRSA by PCR NEGATIVE NEGATIVE Final    Comment:        The GeneXpert MRSA Assay (FDA approved for NASAL specimens only), is one component of a comprehensive MRSA colonization surveillance program. It is not intended to diagnose MRSA infection nor to guide or monitor treatment for MRSA infections.          Radiology Studies: No results found.      Scheduled Meds: . sodium chloride   Intravenous Once  . calcitRIOL  0.5 mcg Oral Daily  . cholecalciferol  400 Units Oral Daily  . citalopram  20 mg Oral Daily  . donepezil  10 mg Oral QHS  . enoxaparin (LOVENOX) injection  40 mg Subcutaneous Q24H  . feeding supplement (ENSURE ENLIVE)  237 mL Oral BID BM  . ferrous sulfate  325 mg Oral TID PC  . memantine  10 mg Oral Daily  . metoprolol succinate  25 mg Oral Daily  . [START ON 09/18/2016] risperiDONE  0.25 mg Oral QHS   Continuous Infusions:    LOS: 4 days    Time spent:60min    Domenic Polite, MD [facility] Pager [phone]  If 7PM-7AM, please contact night-coverage [email or web address] Password [email or web address] 09/17/2016, 1:24 PM

## 2016-09-17 NOTE — Progress Notes (Signed)
   Assessment: 3 Days Post-Op  S/P Procedure(s) (LRB): INTRAMEDULLARY (IM) NAIL FEMORAL (Right) by Dr. Sherolyn Buba on 09/14/16  Principal Problem:   Closed fracture of subtrochanteric section of femur, right, initial encounter [facility]) Active Problems:   History of esophageal cancer   Dementia Acute Blood Loss Anemia, Hgb 8.7 from 9.1.  Transfusion 09/15/16  Plan: Up with therapy Continue plan per medicine  Weight Bearing: Weight Bearing as Tolerated (WBAT)  Dressings: Dry dressing PRN Dispo: Per primary.  SNF.  Subjective: Patient reports pain as mild.  Demented, intermittently agitated / emotional but follows commands.  Objective:   VITALS:   Vitals:   09/16/16 2028 09/17/16 0100 09/17/16 0200 09/17/16 0546  BP: (!) 139/53   (!) 174/64  Pulse: 75   73  Resp:  18 16   Temp: 98.1 F (36.7 C)   98.8 F (37.1 C)  TempSrc: Oral   Oral  SpO2: 94%   97%   CBC Latest Ref Rng & Units 09/17/2016 09/16/2016 09/15/2016  WBC 4.0 - 10.5 K/uL 7.2 7.2 8.7  Hemoglobin 12.0 - 15.0 g/dL 8.7(L) 9.1(L) 8.2(L)  Hematocrit 36.0 - 46.0 % 26.7(L) 27.9(L) 24.8(L)  Platelets 150 - 400 K/uL 192 164 152   BMP Latest Ref Rng & Units 09/16/2016 09/15/2016 09/14/2016  Glucose 65 - 99 mg/dL 113(H) 111(H) 110(H)  BUN 6 - 20 mg/dL 12 17 19   Creatinine 0.44 - 1.00 mg/dL 0.77 0.95 0.90  Sodium 135 - 145 mmol/L 141 139 139  Potassium 3.5 - 5.1 mmol/L 3.3(L) 3.6 3.6  Chloride 101 - 111 mmol/L 105 105 109  CO2 22 - 32 mmol/L 25 26 26   Calcium 8.9 - 10.3 mg/dL 7.7(L) 7.5(L) 7.6(L)   Intake/Output      04/07 0701 - 04/08 0700 04/08 0701 - 04/09 0700   P.O. 240    I.V.     Blood     Total Intake 240     Urine 1200 550   Stool 3 0   Total Output 1203 550   Net -963 -550        Stool Occurrence 1 x 1 x     Physical Exam: General: NAD,  Emotional.  Follows commands MSK Neurovascularly intact Sensation intact distally Dorsiflexion/Plantar flexion intact Incision: dressing C/D/I   Prudencio Burly  III, PA-C 09/17/2016, 7:59 AM

## 2016-09-17 NOTE — Progress Notes (Signed)
Pt was very agitated and was trying to get out of bed. [place] on hands to prevent her from pulling out iv and catheter. She tried to hit the staff when repositioning her. Tylenol given for pain which seems to have helped her rest well tonight.

## 2016-09-17 NOTE — Progress Notes (Signed)
Contacted Social Work per family request; informed they will be by to see patient later today.  Family made aware.

## 2016-09-18 DIAGNOSIS — Z9889 Other specified postprocedural states: Secondary | ICD-10-CM

## 2016-09-18 DIAGNOSIS — Z8781 Personal history of (healed) traumatic fracture: Secondary | ICD-10-CM

## 2016-09-18 LAB — TYPE AND SCREEN
ABO/RH(D): O NEG
Antibody Screen: NEGATIVE
Unit division: 0
Unit division: 0

## 2016-09-18 LAB — CBC
HEMATOCRIT: 29.4 % — AB (ref 36.0–46.0)
Hemoglobin: 9.9 g/dL — ABNORMAL LOW (ref 12.0–15.0)
MCH: 30.8 pg (ref 26.0–34.0)
MCHC: 33.7 g/dL (ref 30.0–36.0)
MCV: 91.6 fL (ref 78.0–100.0)
PLATELETS: 203 10*3/uL (ref 150–400)
RBC: 3.21 MIL/uL — ABNORMAL LOW (ref 3.87–5.11)
RDW: 14.4 % (ref 11.5–15.5)
WBC: 8 10*3/uL (ref 4.0–10.5)

## 2016-09-18 LAB — BPAM RBC
BLOOD PRODUCT EXPIRATION DATE: 201804262359
Blood Product Expiration Date: 201804272359
ISSUE DATE / TIME: 201804061315
UNIT TYPE AND RH: 9500
Unit Type and Rh: 9500

## 2016-09-18 LAB — BASIC METABOLIC PANEL
Anion gap: 7 (ref 5–15)
BUN: 20 mg/dL (ref 6–20)
CALCIUM: 8.7 mg/dL — AB (ref 8.9–10.3)
CO2: 28 mmol/L (ref 22–32)
CREATININE: 0.78 mg/dL (ref 0.44–1.00)
Chloride: 102 mmol/L (ref 101–111)
GFR calc Af Amer: >60 mL/min (ref >60)
GLUCOSE: 123 mg/dL — AB (ref 65–99)
Potassium: 3.6 mmol/L (ref 3.5–5.1)
SODIUM: 137 mmol/L (ref 135–145)

## 2016-09-18 MED ORDER — SENNOSIDES-DOCUSATE SODIUM 8.6-50 MG PO TABS: 1.0000 | ORAL_TABLET | Freq: Every evening | ORAL | Status: DC | PRN

## 2016-09-18 MED ORDER — HYDROCODONE-ACETAMINOPHEN 5-325 MG PO TABS: 1.0000 | ORAL_TABLET | Freq: Four times a day (QID) | ORAL | 0 refills | Status: DC | PRN

## 2016-09-18 MED ORDER — CLONAZEPAM 0.5 MG PO TABS: 0.5000 mg | ORAL_TABLET | Freq: Two times a day (BID) | ORAL | 0 refills | Status: DC | PRN

## 2016-09-18 NOTE — Progress Notes (Signed)
qPhysical Therapy Treatment Patient Details Name: Jamie [name] MRN: [id number] DOB: [birth year] Today's Date: 09/18/2016    History of Present Illness 83 y.o. female admitted to [facility] on 09/13/16 after falling for R hip fx s/p IM nail on 09/14/16.  Pt with significant PMHx of dementia, mitral valve insufficiency, HTN, esophageal CA, dysphasia, COPD, and ORIF L elbow (2009-from a fall).    PT Comments    Patient able to stand pivot EOB to recliner with max +2 assist. Continue to recommend SNF for further skilled PT services to maximize independence and safety with mobility.    Follow Up Recommendations  SNF (one that specializes in dementia care?)     Equipment Recommendations  Rolling walker with 5" wheels    Recommendations for Other Services       Precautions / Restrictions Precautions Precautions: Fall Restrictions Weight Bearing Restrictions: Yes RLE Weight Bearing: Weight bearing as tolerated    Mobility  Bed Mobility Overal bed mobility: Needs Assistance Bed Mobility: Supine to Sit     Supine to sit: HOB elevated;Max assist     General bed mobility comments: assist to bring R LE to EOB, elevate trunk into sitting, and bring hips to EOB with use of bed pad  Transfers Overall transfer level: Needs assistance Equipment used: 2 person hand held assist Transfers: Sit to/from [facility] Sit to Stand: Mod assist;From elevated surface;+2 physical assistance Stand pivot transfers: Max assist;+2 physical assistance       General transfer comment: assist to power up into standing with +2 and use of gait belt; +2 max assist to weight shift and for balance during pivot; pt unable to achieve upright posture and with R lateral lean onto therapist when attempting to weight bear on R LE  Ambulation/Gait             General Gait Details: unable at this time.    Stairs            Wheelchair Mobility    Modified Rankin (Stroke Patients Only)        Balance Overall balance assessment: Needs assistance Sitting-balance support: Feet supported;Bilateral upper extremity supported Sitting balance-Leahy Scale: Poor     Standing balance support: Bilateral upper extremity supported Standing balance-Leahy Scale: Poor                              Cognition Arousal/Alertness: Awake/alert Behavior During Therapy: Restless Overall Cognitive Status: History of cognitive impairments - at baseline                                        Exercises      General Comments        Pertinent Vitals/Pain Pain Assessment: Faces Faces Pain Scale: Hurts even more Pain Location: right leg with mobility Pain Descriptors / Indicators: Grimacing;Guarding Pain Intervention(s): Limited activity within patient's tolerance;Monitored during session;Repositioned;Premedicated before session    Home Living                      Prior Function            PT Goals (current goals can now be found in the care plan section) Acute Rehab PT Goals Patient Stated Goal: unable to state PT Goal Formulation: Patient unable to participate in goal setting Time For Goal Achievement: 09/22/16 Potential to  Achieve Goals: Good Progress towards PT goals: Progressing toward goals    Frequency    Min 3X/week (would benefit from more)      PT Plan Current plan remains appropriate    Co-evaluation             End of Session Equipment Utilized During Treatment: Gait belt Activity Tolerance: Patient tolerated treatment well Patient left: with call bell/phone within reach;in chair;with chair alarm set Nurse Communication: Mobility status PT Visit Diagnosis: Muscle weakness (generalized) (M62.81);Difficulty in walking, not elsewhere classified (R26.2);Pain Pain - Right/Left: Right Pain - part of body: Hip     Time: 3154-0086 PT Time Calculation (min) (ACUTE ONLY): 15 min  Charges:  $Therapeutic Activity: 8-22  mins                    G Codes:       Earney Navy, PTA Pager: [phone]     Darliss Cheney 09/18/2016, 12:04 PM

## 2016-09-18 NOTE — Discharge Summary (Signed)
Physician Discharge [place] PPJ:[id number] DOB: Feb 23, 1929 DOA: 09/13/2016  PCP: Glenda Chroman, MD  Admit date: 09/13/2016 Discharge date: 09/18/2016  Time spent: 35 minutes  Recommendations for Outpatient Follow-up:  1. PCP Dr.Vyas in 1 week 2. Ortho Dr.Landau in 2weeks 3. Continue Lovenox for DVT prophylaxis for 3-4weeks   Discharge Diagnoses:  Principal Problem:   Closed fracture of subtrochanteric section of femur, right, initial encounter ([place]) Active Problems:   History of esophageal cancer   Advanced Dementia   Delirium   S/P ORIF (open reduction internal fixation) fracture   Discharge Condition: stable  Diet recommendation: heart healthy  There were no vitals filed for this visit.  History of present illness:  Jamie A Hiltonis a 83 y.o.femalewith medical history significant for advanced dementia, COPD, esophageal cancer believed to be in remission after treatment 4 years ago. Patient presented to the ED at [facility] following a mechanical fall at home. She normally ambulates without a walker or cane , Was found down at home and brought in to ED at [facility] with severe hip pain and deformity.Xray noted R hip subtroch fracture with avulsion of lesser trochanter  Hospital Course:  1. Closed subtrochanteric femur fx - -Ortho consulted, s/p IM Nail on 4/5 -tolerated procedure without complications -started on lovenox for DVT prophylaxis -PT/OT eval completed, SNF for short term rehab recommended  2. Acute Blood loss anemia -transfused 1 unit PRBC, hb stable since then  3.  Dementia  -moderate to severe, confused at baseline, with intermittent agitation especially in the evenings, she takes klonopin daily as needed for this, changed to BID PRN-hold for sedation -continue aricept/namenda and home regimen of risperidone  4. H/o esophageal CA -felt to be in remission -FU with Oncology  5. HTN - continue  metoprolol  Procedures: 4/5: PROCEDURE:  INTRAMEDULLARY (IM) NAIL RIGHT PERITROCHANTERIC HIP FRACTURE  Consultations:  Ortho Dr.Landau  Discharge Exam: Vitals:   09/17/16 2244 09/18/16 0647  BP: (!) 133/53 (!) 141/95  Pulse: 78 86  Resp: 16 18  Temp: 99.4 F (37.4 C) 98.5 F (36.9 C)    General: AAOx1 only, confused at baseline Cardiovascular: S1S2/RRR Respiratory: CTAB  Discharge Instructions   Discharge Instructions    Diet - low sodium heart healthy    Complete by:  As directed    Increase activity slowly    Complete by:  As directed    Weight bearing as tolerated    Complete by:  As directed      Current Discharge Medication List    START taking these medications   Details  acetaminophen (TYLENOL) 325 MG tablet Take 2 tablets (650 mg total) by mouth every 6 (six) hours as needed. Qty: 60 tablet, Refills: 1    enoxaparin (LOVENOX) 30 MG/0.3ML injection Inject 0.3 mLs (30 mg total) into the skin daily. Qty: 21 Syringe, Refills: 0    HYDROcodone-acetaminophen (NORCO/VICODIN) 5-325 MG tablet Take 1 tablet by mouth every 6 (six) hours as needed for moderate pain. Qty: 15 tablet, Refills: 0      CONTINUE these medications which have CHANGED   Details  clonazePAM (KLONOPIN) 0.5 MG tablet Take 1 tablet (0.5 mg total) by mouth 2 (two) times daily as needed for anxiety (anxiety/agitation). Qty: 15 tablet, Refills: 0      CONTINUE these medications which have NOT CHANGED   Details  Calcium Citrate (CITRACAL PO) Take 1 tablet by mouth daily.    Cholecalciferol (VITAMIN D-3) 1000 UNITS CAPS Take 1,000  Units by mouth daily.     citalopram (CELEXA) 20 MG tablet Take 20 mg by mouth every evening.     Memantine HCl-Donepezil HCl (NAMZARIC) 28-10 MG CP24 Take 1 capsule by mouth daily.    metoprolol succinate (TOPROL-XL) 25 MG 24 hr tablet Take 25 mg by mouth daily with lunch.     risperiDONE (RISPERDAL) 0.25 MG tablet Take 0.25 mg by mouth daily as needed (for  aggitation).       STOP taking these medications     aspirin 81 MG tablet      naproxen sodium (ANAPROX) 220 MG tablet        Allergies  Allergen Reactions  . Codeine Other (See Comments)    Unable to take  . Lipitor [Atorvastatin] Other (See Comments)    Aching, musculoskeletal   Follow-up Information    [facility] Schedule an appointment as soon as possible for a visit in 2 week(s).   Specialty:  [place] information: [place] [address] [phone]        Glenda Chroman, MD. Schedule an appointment as soon as possible for a visit in 1 week(s).   Specialty:  Internal Medicine Contact information: [place] [address] [phone]        Johnny Bridge, MD. Schedule an appointment as soon as possible for a visit in 2 week(s).   Specialty:  Orthopedic Surgery Contact information: [address] [place] [address] [phone]            The results of significant diagnostics from this hospitalization (including imaging, microbiology, ancillary and laboratory) are listed below for reference.    Significant Diagnostic Studies: Dg C-arm 1-60 Min  Result Date: 09/14/2016 CLINICAL DATA:  Fracture fixation EXAM: DG C-ARM 61-120 MIN; RIGHT FEMUR 2 VIEWS COMPARISON:  09/13/2012 FINDINGS: Intertrochanteric fracture has been fixed with compression screw and locking intramedullary rod extending into the distal femur. Fracture alignment appears satisfactory. Hardware positioning appears satisfactory. IMPRESSION: Fracture fixation of intertrochanteric fracture. Electronically Signed   By: Franchot Gallo M.D.   On: 09/14/2016 15:32   Dg Hip Port Unilat With Pelvis 1v Right  Result Date: 09/14/2016 CLINICAL DATA:  Right hip ORIF EXAM: DG HIP (WITH OR WITHOUT PELVIS) 1V PORT RIGHT COMPARISON:  None. FINDINGS: Comminuted right intertrochanteric fracture transfixed with an intramedullary nail and  cannulated femoral neck screw without failure or complication. Lesser trochanter is medially and superiorly displaced. Generalized osteopenia.  No other fracture or dislocation. IMPRESSION: Interval ORIF of a right intertrochanteric fracture. Electronically Signed   By: Kathreen Devoid   On: 09/14/2016 16:07   Dg Femur, Min 2 Views Right  Result Date: 09/14/2016 CLINICAL DATA:  Fracture fixation EXAM: DG C-ARM 61-120 MIN; RIGHT FEMUR 2 VIEWS COMPARISON:  09/13/2012 FINDINGS: Intertrochanteric fracture has been fixed with compression screw and locking intramedullary rod extending into the distal femur. Fracture alignment appears satisfactory. Hardware positioning appears satisfactory. IMPRESSION: Fracture fixation of intertrochanteric fracture. Electronically Signed   By: Franchot Gallo M.D.   On: 09/14/2016 15:32    Microbiology: Recent Results (from the past 240 hour(s))  MRSA PCR Screening     Status: None   Collection Time: 09/14/16 12:10 AM  Result Value Ref Range Status   MRSA by PCR NEGATIVE NEGATIVE Final    Comment:        The GeneXpert MRSA Assay (FDA approved for NASAL specimens only), is one component of a  comprehensive MRSA colonization surveillance program. It is not intended to diagnose MRSA infection nor to guide or monitor treatment for MRSA infections.      Labs: Basic Metabolic Panel:  Recent Labs Lab 09/14/16 0748 09/15/16 0505 09/16/16 0609 09/18/16 0330  NA 139 139 141 137  K 3.6 3.6 3.3* 3.6  CL 109 105 105 102  CO2 26 26 25 28   GLUCOSE 110* 111* 113* 123*  BUN 19 17 12 20   CREATININE 0.90 0.95 0.77 0.78  CALCIUM 7.6* 7.5* 7.7* 8.7*   Liver Function Tests: No results for input(s): AST, ALT, ALKPHOS, BILITOT, PROT, ALBUMIN in the last 168 hours. No results for input(s): LIPASE, AMYLASE in the last 168 hours. No results for input(s): AMMONIA in the last 168 hours. CBC:  Recent Labs Lab 09/14/16 0748 09/15/16 0505 09/16/16 0609 09/17/16 0221  09/18/16 0330  WBC 8.5 8.7 7.2 7.2 8.0  HGB 9.5* 8.2* 9.1* 8.7* 9.9*  HCT 29.4* 24.8* 27.9* 26.7* 29.4*  MCV 92.5 94.3 90.6 91.1 91.6  PLT 158 152 164 192 203   Cardiac Enzymes: No results for input(s): CKTOTAL, CKMB, CKMBINDEX, TROPONINI in the last 168 hours. BNP: BNP (last 3 results) No results for input(s): BNP in the last 8760 hours.  ProBNP (last 3 results) No results for input(s): PROBNP in the last 8760 hours.  CBG: No results for input(s): GLUCAP in the last 168 hours.     SignedDomenic Polite MD.  [facility] 09/18/2016, 10:35 AM

## 2016-09-18 NOTE — Care Management Important Message (Signed)
Important Message  Patient Details  Name: Jamie [name] MRN: [id number] Date of Birth: 01/10/1929   Medicare Important Message Given:  Yes    Abigaelle Verley 09/18/2016, 3:00 PM

## 2016-09-18 NOTE — Clinical Social Work Placement (Signed)
   CLINICAL SOCIAL WORK PLACEMENT  NOTE  Date:  09/18/2016  Patient Details  Name: Jamie [name] MRN: [id number] Date of Birth: 24-Nov-1928  Clinical Social Work is seeking post-discharge placement for this patient at the [place] level of care (*CSW will initial, date and re-position this form in  chart as items are completed):      Patient/family provided with [place] Work Department's list of facilities offering this level of care within the geographic area requested by the patient (or if unable, by the patient's family).  Yes   Patient/family informed of their freedom to choose among providers that offer the needed level of care, that participate in Medicare, Medicaid or managed care program needed by the patient, have an available bed and are willing to accept the patient.      Patient/family informed of [place]'s ownership interest in [facility] and [facility], as well as of the fact that they are under no obligation to receive care at these facilities.  PASRR submitted to EDS on       PASRR number received on 09/18/16     Existing PASRR number confirmed on       FL2 transmitted to all facilities in geographic area requested by pt/family on 09/18/16     FL2 transmitted to all facilities within larger geographic area on       Patient informed that his/her managed care company has contracts with or will negotiate with certain facilities, including the following:        Yes   Patient/family informed of bed offers received.  Patient [facility] bed at [facility]     Physician recommends and patient chooses bed at      Patient to be transferred to [facility] on 09/19/16.  Patient to be transferred to facility by [facility]     Patient family notified on   of transfer.  Name of family member notified:        PHYSICIAN Please prepare priority discharge summary, including medications     Additional  Comment:    _______________________________________________ Eileen Stanford, LCSW 09/18/2016, 4:17 PM

## 2016-09-18 NOTE — Clinical Social Work Note (Signed)
Clinical Social Work Assessment  Patient Details  Name: Jamie [name] MRN: [id number] Date of Birth: 09/28/1928  Date of referral:  09/18/16               Reason for consult:  Facility Placement                Permission sought to share information with:  Family Supports Permission granted to share information::     Name::     Ginger  Agency::     Relationship::  Daughter  Contact Information:     Housing/Transportation Living arrangements for the past 2 months:  Single Family Home Source of Information:  Patient, Adult Children Patient Interpreter Needed:  None Criminal Activity/Legal Involvement Pertinent to Current Situation/Hospitalization:  No - Comment as needed Significant Relationships:  Adult Children Lives with:  Self Do you feel safe going back to the place where you live?    Need for family participation in patient care:     Care giving concerns: Pt's daughter present at bed side during initial assessment.    Social Worker assessment / plan:  CSW spoke with pt's daughter at bedside. Pt is only alert to self. Pt's daughter agreeable for pt to go to SNF at d/c. Pt's daughter prefers for pt to go [facility] at d/c. CSW will speak to facility and facility will initiate auth once bed available.   Employment status:  Retired Nurse, adult PT Recommendations:  [place] / Referral to community resources:  [place]  Patient/Family's Response to care:  Pt's daughter verbalized understanding of CSW role and expressed appreciation for support. Pt's daughter denies any concern regarding pt care at this time.   Patient/Family's Understanding of and Emotional Response to Diagnosis, Current Treatment, and Prognosis:  Pt's daughter understanding and realistic regarding physical limitations. Pt's daughter understands the need for SNF placement at d/c. Pt's daughter agreeable to SNF placement at d/c, at this  time.  Pt's daughter denies any concern regarding treatment plan at this time. CSW will continue to provide support and facilitate d/c needs.   Emotional Assessment Appearance:  Appears stated age Attitude/Demeanor/Rapport:  Unable to Assess Affect (typically observed):  Unable to Assess Orientation:  Oriented to Self Alcohol / Substance use:  Not Applicable Psych involvement (Current and /or in the community):  No (Comment)  Discharge Needs  Concerns to be addressed:  Care Coordination Readmission within the last 30 days:  No Current discharge risk:  Dependent with Mobility Barriers to Discharge:  Ship broker, Continued Medical Work up   [employer], LCSW 09/18/2016, 4:14 PM

## 2016-09-19 MED ORDER — LORAZEPAM 1 MG PO TABS
1.0000 mg | ORAL_TABLET | Freq: Once | ORAL | Status: AC
  Administered 2016-09-19: 1 mg via ORAL
  Filled 2016-09-19: qty 1

## 2016-09-19 MED ORDER — LORAZEPAM 2 MG/ML IJ SOLN: 0.5000 mg | Freq: Once | INTRAMUSCULAR | Status: DC

## 2016-09-19 NOTE — Progress Notes (Signed)
Patient ID: Jamie [name], female   DOB: 11/02/1928, 83 y.o.   MRN: [id number]     Subjective:  Patient reports pain as mild.  Patient demented and is able to follow commands in bed and in no acute distress mild agitation   Objective:   VITALS:   Vitals:   09/18/16 0647 09/18/16 1400 09/18/16 2228 09/19/16 0402  BP: (!) 141/95 (!) 162/61 (!) 121/52 (!) 137/53  Pulse: 86 77 68 69  Resp: 18 18 14 16   Temp: 98.5 F (36.9 C) 98.7 F (37.1 C) 97.5 F (36.4 C) 98.6 F (37 C)  TempSrc: Oral Oral Oral Oral  SpO2: 97% 98% 90% 94%    ABD soft Sensation intact distally Dorsiflexion/Plantar flexion intact Incision: dressing C/D/I and no drainage She has been removing her dressing and IV  Lab Results  Component Value Date   WBC 8.0 09/18/2016   HGB 9.9 (L) 09/18/2016   HCT 29.4 (L) 09/18/2016   MCV 91.6 09/18/2016   PLT 203 09/18/2016   BMET    Component Value Date/Time   NA 137 09/18/2016 0330   NA 141 07/05/2012 1220   K 3.6 09/18/2016 0330   K 4.0 07/05/2012 1220   CL 102 09/18/2016 0330   CL 102 07/05/2012 1220   CO2 28 09/18/2016 0330   CO2 28 07/05/2012 1220   GLUCOSE 123 (H) 09/18/2016 0330   GLUCOSE 92 07/05/2012 1220   BUN 20 09/18/2016 0330   BUN 12.8 07/05/2012 1220   CREATININE 0.78 09/18/2016 0330   CREATININE 0.8 07/05/2012 1220   CALCIUM 8.7 (L) 09/18/2016 0330   CALCIUM 9.5 07/05/2012 1220   GFRNONAA >60 09/18/2016 0330   GFRAA >60 09/18/2016 0330     Assessment/Plan: 5 Days Post-Op   Principal Problem:   Closed fracture of subtrochanteric section of femur, right, initial encounter ([place]) Active Problems:   History of esophageal cancer   Dementia   S/P ORIF (open reduction internal fixation) fracture   Advance diet Up with therapy Continue pan per medicine WBAT Dry dressing PRN   DOUGLAS PARRY, BRANDON 09/19/2016, 10:38 AM  Discussed and agree with above.    Marchia Bond, MD Cell [phone]

## 2016-09-19 NOTE — Progress Notes (Addendum)
LCSW following for disposition:  SNF placement.  Barrier currently:  Publishing copy.  Call placed at 0900am to facility regarding status. All discharge paperwork sent over via [place]. Message left for admissions regarding status.  Call placed around 1pm regarding insurance auth with facility still awaiting and insurance reviewing.  Will continue to assist with discharge planning and needs once call returned.  Lane Hacker, MSW Clinical Social Work: [employer] Coverage for :  [phone]

## 2016-09-19 NOTE — Progress Notes (Signed)
No changes, DC completed 4/9, awaiting insurance authorization

## 2016-09-19 NOTE — Progress Notes (Signed)
Patient has removed her blood bracelet, IV, gauze, and mepilax from her surgery site. This will be the third time tonight that patient has done so.This RN will wait until patient falls asleep to replace everything. Will continue to monitor

## 2016-09-20 DIAGNOSIS — I10 Essential (primary) hypertension: Secondary | ICD-10-CM | POA: Diagnosis not present

## 2016-09-20 DIAGNOSIS — S72009A Fracture of unspecified part of neck of unspecified femur, initial encounter for closed fracture: Secondary | ICD-10-CM | POA: Diagnosis not present

## 2016-09-20 DIAGNOSIS — E782 Mixed hyperlipidemia: Secondary | ICD-10-CM | POA: Diagnosis not present

## 2016-09-20 DIAGNOSIS — Z967 Presence of other bone and tendon implants: Secondary | ICD-10-CM | POA: Diagnosis not present

## 2016-09-20 DIAGNOSIS — G8911 Acute pain due to trauma: Secondary | ICD-10-CM | POA: Diagnosis not present

## 2016-09-20 DIAGNOSIS — F419 Anxiety disorder, unspecified: Secondary | ICD-10-CM | POA: Diagnosis not present

## 2016-09-20 DIAGNOSIS — Z79899 Other long term (current) drug therapy: Secondary | ICD-10-CM | POA: Diagnosis not present

## 2016-09-20 DIAGNOSIS — R4182 Altered mental status, unspecified: Secondary | ICD-10-CM | POA: Diagnosis not present

## 2016-09-20 DIAGNOSIS — S72141D Displaced intertrochanteric fracture of right femur, subsequent encounter for closed fracture with routine healing: Secondary | ICD-10-CM | POA: Diagnosis not present

## 2016-09-20 DIAGNOSIS — R451 Restlessness and agitation: Secondary | ICD-10-CM | POA: Diagnosis not present

## 2016-09-20 DIAGNOSIS — B351 Tinea unguium: Secondary | ICD-10-CM | POA: Diagnosis not present

## 2016-09-20 DIAGNOSIS — J449 Chronic obstructive pulmonary disease, unspecified: Secondary | ICD-10-CM | POA: Diagnosis not present

## 2016-09-20 DIAGNOSIS — S7221XA Displaced subtrochanteric fracture of right femur, initial encounter for closed fracture: Principal | ICD-10-CM

## 2016-09-20 DIAGNOSIS — Z8501 Personal history of malignant neoplasm of esophagus: Secondary | ICD-10-CM

## 2016-09-20 DIAGNOSIS — Z8781 Personal history of (healed) traumatic fracture: Secondary | ICD-10-CM

## 2016-09-20 DIAGNOSIS — E119 Type 2 diabetes mellitus without complications: Secondary | ICD-10-CM | POA: Diagnosis not present

## 2016-09-20 DIAGNOSIS — S7221XD Displaced subtrochanteric fracture of right femur, subsequent encounter for closed fracture with routine healing: Secondary | ICD-10-CM | POA: Diagnosis not present

## 2016-09-20 DIAGNOSIS — E559 Vitamin D deficiency, unspecified: Secondary | ICD-10-CM | POA: Diagnosis not present

## 2016-09-20 DIAGNOSIS — M80051D Age-related osteoporosis with current pathological fracture, right femur, subsequent encounter for fracture with routine healing: Secondary | ICD-10-CM | POA: Diagnosis not present

## 2016-09-20 DIAGNOSIS — M21611 Bunion of right foot: Secondary | ICD-10-CM | POA: Diagnosis not present

## 2016-09-20 DIAGNOSIS — T84194D Other mechanical complication of internal fixation device of right femur, subsequent encounter: Secondary | ICD-10-CM | POA: Diagnosis not present

## 2016-09-20 DIAGNOSIS — F0390 Unspecified dementia without behavioral disturbance: Secondary | ICD-10-CM

## 2016-09-20 DIAGNOSIS — I739 Peripheral vascular disease, unspecified: Secondary | ICD-10-CM | POA: Diagnosis not present

## 2016-09-20 DIAGNOSIS — D518 Other vitamin B12 deficiency anemias: Secondary | ICD-10-CM | POA: Diagnosis not present

## 2016-09-20 DIAGNOSIS — Z419 Encounter for procedure for purposes other than remedying health state, unspecified: Secondary | ICD-10-CM | POA: Diagnosis not present

## 2016-09-20 DIAGNOSIS — R4702 Dysphasia: Secondary | ICD-10-CM | POA: Diagnosis not present

## 2016-09-20 DIAGNOSIS — E785 Hyperlipidemia, unspecified: Secondary | ICD-10-CM | POA: Diagnosis not present

## 2016-09-20 DIAGNOSIS — L84 Corns and callosities: Secondary | ICD-10-CM | POA: Diagnosis not present

## 2016-09-20 DIAGNOSIS — R69 Illness, unspecified: Secondary | ICD-10-CM | POA: Diagnosis not present

## 2016-09-20 NOTE — Clinical Social Work Placement (Signed)
   CLINICAL SOCIAL WORK PLACEMENT  NOTE 09/20/16 - DISCHARGED TO [facility] VIA AMBULANCE  Date:  09/20/2016  Patient Details  Name: Jamie [name] MRN: [id number] Date of Birth: Mar 15, 1929  Clinical Social Work is seeking post-discharge placement for this patient at the [place] level of care (*CSW will initial, date and re-position this form in  chart as items are completed):      Patient/family provided with [place] Work Department's list of facilities offering this level of care within the geographic area requested by the patient (or if unable, by the patient's family).  Yes   Patient/family informed of their freedom to choose among providers that offer the needed level of care, that participate in Medicare, Medicaid or managed care program needed by the patient, have an available bed and are willing to accept the patient.      Patient/family informed of [place]'s ownership interest in [facility] and [facility], as well as of the fact that they are under no obligation to receive care at these facilities.  PASRR submitted to EDS on       PASRR number received on 09/18/16     Existing PASRR number confirmed on       FL2 transmitted to all facilities in geographic area requested by pt/family on 09/18/16     FL2 transmitted to all facilities within larger geographic area on       Patient informed that his/her managed care company has contracts with or will negotiate with certain facilities, including the following:        Yes   Patient/family informed of bed offers received.  Patient [facility] bed at [facility]     Physician recommends and patient chooses bed at      Patient to be transferred to [facility] on 09/19/16.  Patient to be transferred to facility by [facility]     Patient family notified on  09/20/16 of transfer.  Name of family member notified:   Ginger Smothers - daughter by phone  [phone]).     PHYSICIAN Please prepare priority discharge summary, including medications     Additional Comment:    _______________________________________________ Sable Feil, LCSW 09/20/2016, 5:55 PM

## 2016-09-20 NOTE — Progress Notes (Signed)
Occupational Therapy Treatment Patient Details Name: Jamie [name] MRN: [id number] DOB: May 21, 1929 Today's Date: 09/20/2016    History of present illness 83 y.o. female admitted to [facility] on 09/13/16 after falling for R hip fx s/p IM nail on 09/14/16.  Pt with significant PMHx of dementia, mitral valve insufficiency, HTN, esophageal CA, dysphasia, COPD, and ORIF L elbow (2009-from a fall).   OT comments  Pt making small improvements. Pt able to stand with +1 assist now and weight bears on BLEs better than previous session.  Pt continues to be a fall risk due to very poor balance. Pt does not have awareness of when she loses her balance so cannot take the precautions to correct herself.  SNF rehab is needed to address balance and transfers.  Pt very agreeable to working today.   Follow Up Recommendations  SNF;Supervision/Assistance - 24 hour    Equipment Recommendations       Recommendations for Other Services      Precautions / Restrictions Precautions Precautions: Fall Restrictions Weight Bearing Restrictions: No RLE Weight Bearing: Weight bearing as tolerated       Mobility Bed Mobility               General bed mobility comments: Pt in chair on arrival.  Transfers Overall transfer level: Needs assistance Equipment used: Rolling walker (2 wheeled) Transfers: Sit to/from [employer] Transfers Sit to Stand: Mod assist;From elevated surface;+2 physical assistance Stand pivot transfers: Mod assist       General transfer comment: Pt with no awareness of when she is losing her balance. Pt can now power up with min assist and weight bears well on both legs but loses balance often and easily.    Balance Overall balance assessment: Needs assistance Sitting-balance support: Feet supported;Bilateral upper extremity supported Sitting balance-Leahy Scale: Poor   Postural control: Posterior lean Standing balance support: Bilateral upper extremity supported Standing  balance-Leahy Scale: Poor Standing balance comment: mod assist in standing x 2 for ~30 seconds each time.                            ADL either performed or assessed with clinical judgement   ADL Overall ADL's : Needs assistance/impaired Eating/Feeding: Minimal assistance;Sitting Eating/Feeding Details (indicate cue type and reason): cues to use hands to eat sandwhiches.  Pt occasionally needed physical assist but overall VCs allowed pt do physically feed self on her own.  Most of the food was finger food and a cup with a lid was very helpful to encourage independence drinking. Grooming: Maximal assistance;Wash/dry face;Wash/dry hands;Standing Grooming Details (indicate cue type and reason): pt stood at sink to wash hands and face. Pt with very poor balance but able to stand.  Pt with no awareness of when she was losing her balance.                 Toilet Transfer: Moderate assistance;Ambulation;RW;Cueing for sequencing;Cueing for safety Toilet Transfer Details (indicate cue type and reason): Pt walked to bathroom with mod assist and walker to toilet. Max cues for safety and hand placement. Toileting- Clothing Manipulation and Hygiene: Moderate assistance;Sitting/lateral lean;Cueing for sequencing;Cueing for compensatory techniques Toileting - Clothing Manipulation Details (indicate cue type and reason): Pt able to clean self in sitting.  unable to manage clothing.  Total assist to manage clothing.  Pt cannot let go of walker while standing without losing full balance.     Functional mobility during ADLs: Maximal assistance;Rolling  walker General ADL Comments: Pt currently mod to max assist for most adls.  Pt with very poor balance in standing requiring assist at all times.     Vision   Vision Assessment?: No apparent visual deficits   Perception     Praxis      Cognition Arousal/Alertness: Awake/alert Behavior During Therapy: WFL for tasks  assessed/performed Overall Cognitive Status: History of cognitive impairments - at baseline                                 General Comments: Pt answered some questions appropriately.  As session went on, pt talked about random things unrelated to task at hand.        Exercises     Shoulder Instructions       General Comments      Pertinent Vitals/ Pain       Pain Assessment: Faces Faces Pain Scale: Hurts little more Pain Location: right leg with mobility Pain Descriptors / Indicators: Grimacing;Guarding Pain Intervention(s): Limited activity within patient's tolerance;Monitored during session;Repositioned  Home Living                                          Prior Functioning/Environment              Frequency  Min 2X/week        Progress Toward Goals  OT Goals(current goals can now be found in the care plan section)  Progress towards OT goals: Progressing toward goals  Acute Rehab OT Goals Patient Stated Goal: unable to state OT Goal Formulation: Patient unable to participate in goal setting Time For Goal Achievement: 09/30/16 Potential to Achieve Goals: Fair ADL Goals Pt Will Perform Grooming: with min guard assist;sitting Pt Will Transfer to Toilet: with min assist;stand pivot transfer;bedside commode Additional ADL Goal #1: Pt will perform bed mobililty with min assist as precursor to ADL.  Plan Discharge plan remains appropriate    Co-evaluation                 End of Session Equipment Utilized During Treatment: Rolling walker  OT Visit Diagnosis: Unsteadiness on feet (R26.81)   Activity Tolerance Patient limited by pain   Patient Left in chair;with call bell/phone within reach;with chair alarm set;with family/visitor present   Nurse Communication Mobility status        Time: 2836-6294 OT Time Calculation (min): 21 min  Charges: OT General Charges $OT Visit: 1 Procedure OT Treatments $Self  Care/Home Management : 8-22 mins  Jinger Neighbors, OTR/L [phone]   Glenford Peers 09/20/2016, 1:19 PM

## 2016-09-20 NOTE — Progress Notes (Signed)
Called report to [place] at [facility]. Patient is ready for transportation. Resting in bed at this time. Nursing will continue to monitor.

## 2016-09-20 NOTE — Progress Notes (Signed)
09/20/16  0730  Patient has no IV access. Pt continues to remove IV each time one is placed.

## 2016-09-20 NOTE — Progress Notes (Signed)
Physical Therapy Treatment Patient Details Name: Jamie [name] MRN: [id number] DOB: 1928/12/19 Today's Date: 09/20/2016    History of Present Illness 83 y.o. female admitted to [facility] on 09/13/16 after falling for R hip fx s/p IM nail on 09/14/16.  Pt with significant PMHx of dementia, mitral valve insufficiency, HTN, esophageal CA, dysphasia, COPD, and ORIF L elbow (2009-from a fall).    PT Comments    Patient is making progress with mobility and able to ambulate with mod A +2 this session. Current plan remains appropriate.    Follow Up Recommendations  SNF (one that specializes in dementia care?)     Equipment Recommendations  Rolling walker with 5" wheels    Recommendations for Other Services       Precautions / Restrictions Precautions Precautions: Fall Restrictions Weight Bearing Restrictions: Yes RLE Weight Bearing: Weight bearing as tolerated    Mobility  Bed Mobility Overal bed mobility: Needs Assistance Bed Mobility: Supine to Sit     Supine to sit: Min assist;HOB elevated     General bed mobility comments: assist to bring R LE to EOB; pt required less assist this session and able to elevate trunk and scoot hips forward   Transfers Overall transfer level: Needs assistance Equipment used: Rolling walker (2 wheeled) Transfers: Sit to/from Stand Sit to Stand: Mod assist;From elevated surface;+2 physical assistance         General transfer comment: assist to power up into standing and to maintain balance upon stand; posterior lean  Ambulation/Gait Ambulation/Gait assistance: Mod assist;+2 safety/equipment Ambulation Distance (Feet): 25 Feet Assistive device: Rolling walker (2 wheeled) Gait Pattern/deviations: Step-through pattern;Decreased stride length;Trunk flexed;Narrow base of support     General Gait Details: assist for balance and mangement of RW; multimodal cues for posture and stride length   Stairs            Wheelchair Mobility     Modified Rankin (Stroke Patients Only)       Balance Overall balance assessment: Needs assistance Sitting-balance support: Feet supported;Bilateral upper extremity supported Sitting balance-Leahy Scale: Poor     Standing balance support: Bilateral upper extremity supported Standing balance-Leahy Scale: Poor                              Cognition Arousal/Alertness: Awake/alert Behavior During Therapy: Restless Overall Cognitive Status: History of cognitive impairments - at baseline                                        Exercises      General Comments        Pertinent Vitals/Pain Pain Assessment: Faces Faces Pain Scale: Hurts little more Pain Location: right leg with mobility Pain Descriptors / Indicators: Grimacing;Guarding;Sore Pain Intervention(s): Limited activity within patient's tolerance;Monitored during session;Repositioned    Home Living                      Prior Function            PT Goals (current goals can now be found in the care plan section) Acute Rehab PT Goals Patient Stated Goal: unable to state PT Goal Formulation: Patient unable to participate in goal setting Time For Goal Achievement: 09/22/16 Potential to Achieve Goals: Good Progress towards PT goals: Progressing toward goals    Frequency    Min 3X/week (would  benefit from more)      PT Plan Current plan remains appropriate    Co-evaluation             End of Session Equipment Utilized During Treatment: Gait belt Activity Tolerance: Patient tolerated treatment well Patient left: with call bell/phone within reach;in chair;with family/visitor present Nurse Communication: Mobility status PT Visit Diagnosis: Muscle weakness (generalized) (M62.81);Difficulty in walking, not elsewhere classified (R26.2);Pain Pain - Right/Left: Right Pain - part of body: Hip     Time: 1204-1226 PT Time Calculation (min) (ACUTE ONLY): 22  min  Charges:  $Gait Training: 8-22 mins                    G Codes:       Earney Navy, PTA Pager: [phone]     Darliss Cheney 09/20/2016, 5:07 PM

## 2016-09-20 NOTE — Discharge Summary (Signed)
Physician Discharge [place] HFW:[id number] DOB: [birth year] DOA: 09/13/2016  PCP: Glenda Chroman, MD  Admit date: 09/13/2016 Discharge date: 09/20/2016  Time spent: 20 minutes  Recommendations for Outpatient Follow-up:  1. PCP Dr.Vyas in 1 week 2. Ortho Dr.Landau in 2weeks 3. Continue Lovenox for DVT prophylaxis for 3 weeks 4. Ortho Recommendations WBAT, lovenox 3 weeks, follow-up 2 weeks with Dr Mardelle Matte  Discharge Diagnoses:  Principal Problem:   Closed fracture of subtrochanteric section of femur, right, initial encounter ([place]) Active Problems:   History of esophageal cancer   Advanced Dementia   Delirium   S/P ORIF (open reduction internal fixation) fracture   Discharge Condition: stable  Diet recommendation: heart healthy  History of present illness:  Jamie A Hiltonis a 83 y.o.femalewith medical history significant for advanced dementia, COPD, esophageal cancer believed to be in remission after treatment 4 years ago. Patient presented to the ED at [facility] following a mechanical fall at home. She normally ambulates without a walker or cane , Was found down at home and brought in to ED at [facility] with severe hip pain and deformity.Xray noted R hip subtroch fracture with avulsion of lesser trochanter  Hospital Course:  Closed subtrochanteric femur fracture  - Ortho was consulted, s/p IM Nail on 4/5 -tolerated procedure without complications -started on lovenox for DVT prophylaxis -PT/OT eval completed, SNF for short term rehab recommended  2. Acute Blood loss anemia -transfused 1 unit PRBC, hb stable since then, hemoglobin 9.9 at the time of discharge.  3.  Dementia  -moderate to severe, confused at baseline, with intermittent agitation especially in the evenings, she takes klonopin daily as needed for this, changed to BID PRN-hold for sedation -continue aricept/namenda and home regimen of risperidone  4. H/o esophageal CA -felt to  be in remission -FU with Oncology outpatient  5. HTN - continue metoprolol  Procedures: 4/5: PROCEDURE:  INTRAMEDULLARY (IM) NAIL RIGHT PERITROCHANTERIC HIP FRACTURE  Consultations:  Ortho Dr.Landau  Discharge Exam: Vitals:   09/20/16 0857 09/20/16 0859  BP: (!) 102/40 (!) 92/34  Pulse: 60 (!) 58  Resp: 20 20  Temp:      General: AAOx1 only, confused at baseline Cardiovascular: S1S2Clear, regular rate and rhythm Respiratory: CTAB Abdominal: Soft, nontender, nondistended normal bowel sounds Extremities: No cyanosis, clubbing, edema  Discharge Instructions   Discharge Instructions    Diet - low sodium heart healthy    Complete by:  As directed    Increase activity slowly    Complete by:  As directed    Weight bearing as tolerated    Complete by:  As directed      Current Discharge Medication List    START taking these medications   Details  acetaminophen (TYLENOL) 325 MG tablet Take 2 tablets (650 mg total) by mouth every 6 (six) hours as needed. Qty: 60 tablet, Refills: 1    enoxaparin (LOVENOX) 30 MG/0.3ML injection Inject 0.3 mLs (30 mg total) into the skin daily. Qty: 21 Syringe, Refills: 0    HYDROcodone-acetaminophen (NORCO/VICODIN) 5-325 MG tablet Take 1 tablet by mouth every 6 (six) hours as needed for moderate pain. Qty: 15 tablet, Refills: 0      CONTINUE these medications which have CHANGED   Details  clonazePAM (KLONOPIN) 0.5 MG tablet Take 1 tablet (0.5 mg total) by mouth 2 (two) times daily as needed for anxiety (anxiety/agitation). Qty: 15 tablet, Refills: 0      CONTINUE these medications which have NOT CHANGED  Details  Calcium Citrate (CITRACAL PO) Take 1 tablet by mouth daily.    Cholecalciferol (VITAMIN D-3) 1000 UNITS CAPS Take 1,000 Units by mouth daily.     citalopram (CELEXA) 20 MG tablet Take 20 mg by mouth every evening.     Memantine HCl-Donepezil HCl (NAMZARIC) 28-10 MG CP24 Take 1 capsule by mouth daily.    metoprolol  succinate (TOPROL-XL) 25 MG 24 hr tablet Take 25 mg by mouth daily with lunch.     risperiDONE (RISPERDAL) 0.25 MG tablet Take 0.25 mg by mouth daily as needed (for aggitation).       STOP taking these medications     aspirin 81 MG tablet      naproxen sodium (ANAPROX) 220 MG tablet        Allergies  Allergen Reactions  . Codeine Other (See Comments)    Unable to take  . Lipitor [Atorvastatin] Other (See Comments)    Aching, musculoskeletal    Contact information for follow-up providers    [facility]. Schedule an appointment as soon as possible for a visit in 2 week(s).   Specialty:  [place] information: [place] [address] [phone]        Glenda Chroman, MD. Schedule an appointment as soon as possible for a visit in 1 week(s).   Specialty:  Internal Medicine Contact information:  [place] [address] [phone]        Johnny Bridge, MD. Schedule an appointment as soon as possible for a visit in 2 week(s).   Specialty:  Orthopedic Surgery Contact information: [place] [address] [phone]            Contact information for after-discharge care    [facility] .   Specialty:  [place] information: [address] [place] [phone]                   The results of significant diagnostics from this hospitalization (including imaging, microbiology, ancillary and laboratory) are listed below for reference.    Significant Diagnostic Studies: Dg C-arm 1-60 Min  Result Date: 09/14/2016 CLINICAL DATA:  Fracture fixation EXAM: DG C-ARM 61-120 MIN; RIGHT FEMUR 2 VIEWS COMPARISON:  09/13/2012 FINDINGS: Intertrochanteric fracture has been fixed with compression screw and locking intramedullary rod extending into the distal femur. Fracture alignment appears satisfactory.  Hardware positioning appears satisfactory. IMPRESSION: Fracture fixation of intertrochanteric fracture. Electronically Signed   By: Franchot Gallo M.D.   On: 09/14/2016 15:32   Dg Hip Port Unilat With Pelvis 1v Right  Result Date: 09/14/2016 CLINICAL DATA:  Right hip ORIF EXAM: DG HIP (WITH OR WITHOUT PELVIS) 1V PORT RIGHT COMPARISON:  None. FINDINGS: Comminuted right intertrochanteric fracture transfixed with an intramedullary nail and cannulated femoral neck screw without failure or complication. Lesser trochanter is medially and superiorly displaced. Generalized osteopenia.  No other fracture or dislocation. IMPRESSION: Interval ORIF of a right intertrochanteric fracture. Electronically Signed   By: Kathreen Devoid   On: 09/14/2016 16:07   Dg Femur, Min 2 Views Right  Result Date: 09/14/2016 CLINICAL DATA:  Fracture fixation EXAM: DG C-ARM 61-120 MIN; RIGHT FEMUR 2 VIEWS COMPARISON:  09/13/2012 FINDINGS: Intertrochanteric fracture has been fixed with compression screw and locking intramedullary rod extending into the distal femur. Fracture alignment appears satisfactory. Hardware positioning appears satisfactory. IMPRESSION: Fracture fixation of intertrochanteric fracture. Electronically Signed  By: Franchot Gallo M.D.   On: 09/14/2016 15:32    Microbiology: Recent Results (from the past 240 hour(s))  MRSA PCR Screening     Status: None   Collection Time: 09/14/16 12:10 AM  Result Value Ref Range Status   MRSA by PCR NEGATIVE NEGATIVE Final    Comment:        The GeneXpert MRSA Assay (FDA approved for NASAL specimens only), is one component of a comprehensive MRSA colonization surveillance program. It is not intended to diagnose MRSA infection nor to guide or monitor treatment for MRSA infections.      Labs: Basic Metabolic Panel:  Recent Labs Lab 09/14/16 0748 09/15/16 0505 09/16/16 0609 09/18/16 0330  NA 139 139 141 137  K 3.6 3.6 3.3* 3.6  CL 109 105 105 102  CO2 26 26 25  28   GLUCOSE 110* 111* 113* 123*  BUN 19 17 12 20   CREATININE 0.90 0.95 0.77 0.78  CALCIUM 7.6* 7.5* 7.7* 8.7*   Liver Function Tests: No results for input(s): AST, ALT, ALKPHOS, BILITOT, PROT, ALBUMIN in the last 168 hours. No results for input(s): LIPASE, AMYLASE in the last 168 hours. No results for input(s): AMMONIA in the last 168 hours. CBC:  Recent Labs Lab 09/14/16 0748 09/15/16 0505 09/16/16 0609 09/17/16 0221 09/18/16 0330  WBC 8.5 8.7 7.2 7.2 8.0  HGB 9.5* 8.2* 9.1* 8.7* 9.9*  HCT 29.4* 24.8* 27.9* 26.7* 29.4*  MCV 92.5 94.3 90.6 91.1 91.6  PLT 158 152 164 192 203   Cardiac Enzymes: No results for input(s): CKTOTAL, CKMB, CKMBINDEX, TROPONINI in the last 168 hours. BNP: BNP (last 3 results) No results for input(s): BNP in the last 8760 hours.  ProBNP (last 3 results) No results for input(s): PROBNP in the last 8760 hours.  CBG: No results for input(s): GLUCAP in the last 168 hours.     Signed:  Estill Cotta MD.  [facility] 09/20/2016, 11:13 AM

## 2016-09-20 NOTE — Progress Notes (Addendum)
     Subjective:  Patient reports pain as mild.  Actually talking to me in sentences.  Confused at baseline.    Objective:   VITALS:   Vitals:   09/19/16 0402 09/19/16 1630 09/19/16 1950 09/20/16 0407  BP: (!) 137/53 (!) 131/50 (!) 154/57 (!) 139/53  Pulse: 69 75 81 67  Resp: 16  16 16   Temp: 98.6 F (37 C) 98.3 F (36.8 C) 98.2 F (36.8 C) 98.4 F (36.9 C)  TempSrc: Oral  Oral Oral  SpO2: 94% 93% 97% 97%    Neurologically intact Dorsiflexion/Plantar flexion intact Incision: dressing C/D/I and no drainage She has pulled off proximal dressing, steris in place, replaced dressing.  Lab Results  Component Value Date   WBC 8.0 09/18/2016   HGB 9.9 (L) 09/18/2016   HCT 29.4 (L) 09/18/2016   MCV 91.6 09/18/2016   PLT 203 09/18/2016   BMET    Component Value Date/Time   NA 137 09/18/2016 0330   NA 141 07/05/2012 1220   K 3.6 09/18/2016 0330   K 4.0 07/05/2012 1220   CL 102 09/18/2016 0330   CL 102 07/05/2012 1220   CO2 28 09/18/2016 0330   CO2 28 07/05/2012 1220   GLUCOSE 123 (H) 09/18/2016 0330   GLUCOSE 92 07/05/2012 1220   BUN 20 09/18/2016 0330   BUN 12.8 07/05/2012 1220   CREATININE 0.78 09/18/2016 0330   CREATININE 0.8 07/05/2012 1220   CALCIUM 8.7 (L) 09/18/2016 0330   CALCIUM 9.5 07/05/2012 1220   GFRNONAA >60 09/18/2016 0330   GFRAA >60 09/18/2016 0330     Assessment/Plan: 6 Days Post-Op   Principal Problem:   Closed fracture of subtrochanteric section of femur, right, initial encounter ([place]) Active Problems:   History of esophageal cancer   Dementia   S/P ORIF (open reduction internal fixation) fracture   Advance diet Up with therapy Discharge to SNF  WBAT, lovenox 3 weeks, RTC 2 weeks with me, all put into dc navigator.  Jaspreet Hollings P 09/20/2016, 8:13 AM   Marchia Bond, MD Cell [phone]

## 2016-09-22 DIAGNOSIS — R4182 Altered mental status, unspecified: Secondary | ICD-10-CM | POA: Diagnosis not present

## 2016-09-22 DIAGNOSIS — I10 Essential (primary) hypertension: Secondary | ICD-10-CM | POA: Diagnosis not present

## 2016-09-22 DIAGNOSIS — J449 Chronic obstructive pulmonary disease, unspecified: Secondary | ICD-10-CM | POA: Diagnosis not present

## 2016-09-22 DIAGNOSIS — R4702 Dysphasia: Secondary | ICD-10-CM | POA: Diagnosis not present

## 2016-09-27 DIAGNOSIS — I10 Essential (primary) hypertension: Secondary | ICD-10-CM | POA: Diagnosis not present

## 2016-09-27 DIAGNOSIS — J449 Chronic obstructive pulmonary disease, unspecified: Secondary | ICD-10-CM | POA: Diagnosis not present

## 2016-09-27 DIAGNOSIS — M80051D Age-related osteoporosis with current pathological fracture, right femur, subsequent encounter for fracture with routine healing: Secondary | ICD-10-CM | POA: Diagnosis not present

## 2016-09-27 DIAGNOSIS — S7221XD Displaced subtrochanteric fracture of right femur, subsequent encounter for closed fracture with routine healing: Secondary | ICD-10-CM | POA: Diagnosis not present

## 2016-09-27 DIAGNOSIS — S72141D Displaced intertrochanteric fracture of right femur, subsequent encounter for closed fracture with routine healing: Secondary | ICD-10-CM | POA: Diagnosis not present

## 2016-09-27 DIAGNOSIS — R69 Illness, unspecified: Secondary | ICD-10-CM | POA: Diagnosis not present

## 2016-09-29 DIAGNOSIS — J449 Chronic obstructive pulmonary disease, unspecified: Secondary | ICD-10-CM | POA: Diagnosis not present

## 2016-09-29 DIAGNOSIS — R4182 Altered mental status, unspecified: Secondary | ICD-10-CM | POA: Diagnosis not present

## 2016-09-29 DIAGNOSIS — R69 Illness, unspecified: Secondary | ICD-10-CM | POA: Diagnosis not present

## 2016-10-06 DIAGNOSIS — J449 Chronic obstructive pulmonary disease, unspecified: Secondary | ICD-10-CM | POA: Diagnosis not present

## 2016-10-06 DIAGNOSIS — R4182 Altered mental status, unspecified: Secondary | ICD-10-CM | POA: Diagnosis not present

## 2016-10-06 DIAGNOSIS — S7221XD Displaced subtrochanteric fracture of right femur, subsequent encounter for closed fracture with routine healing: Secondary | ICD-10-CM | POA: Diagnosis not present

## 2016-10-06 DIAGNOSIS — R69 Illness, unspecified: Secondary | ICD-10-CM | POA: Diagnosis not present

## 2016-10-13 DIAGNOSIS — M21611 Bunion of right foot: Secondary | ICD-10-CM | POA: Diagnosis not present

## 2016-10-13 DIAGNOSIS — J449 Chronic obstructive pulmonary disease, unspecified: Secondary | ICD-10-CM | POA: Diagnosis not present

## 2016-10-13 DIAGNOSIS — R4182 Altered mental status, unspecified: Secondary | ICD-10-CM | POA: Diagnosis not present

## 2016-10-13 DIAGNOSIS — I739 Peripheral vascular disease, unspecified: Secondary | ICD-10-CM | POA: Diagnosis not present

## 2016-10-13 DIAGNOSIS — S7221XD Displaced subtrochanteric fracture of right femur, subsequent encounter for closed fracture with routine healing: Secondary | ICD-10-CM | POA: Diagnosis not present

## 2016-10-13 DIAGNOSIS — B351 Tinea unguium: Secondary | ICD-10-CM | POA: Diagnosis not present

## 2016-10-13 DIAGNOSIS — R69 Illness, unspecified: Secondary | ICD-10-CM | POA: Diagnosis not present

## 2016-10-13 DIAGNOSIS — L84 Corns and callosities: Secondary | ICD-10-CM | POA: Diagnosis not present

## 2016-10-19 DIAGNOSIS — J449 Chronic obstructive pulmonary disease, unspecified: Secondary | ICD-10-CM | POA: Diagnosis not present

## 2016-10-19 DIAGNOSIS — R4182 Altered mental status, unspecified: Secondary | ICD-10-CM | POA: Diagnosis not present

## 2016-10-19 DIAGNOSIS — R69 Illness, unspecified: Secondary | ICD-10-CM | POA: Diagnosis not present

## 2016-10-19 DIAGNOSIS — S7221XD Displaced subtrochanteric fracture of right femur, subsequent encounter for closed fracture with routine healing: Secondary | ICD-10-CM | POA: Diagnosis not present

## 2016-10-20 DIAGNOSIS — R69 Illness, unspecified: Secondary | ICD-10-CM | POA: Diagnosis not present

## 2016-10-20 DIAGNOSIS — F419 Anxiety disorder, unspecified: Secondary | ICD-10-CM | POA: Diagnosis not present

## 2016-10-25 DIAGNOSIS — S72141D Displaced intertrochanteric fracture of right femur, subsequent encounter for closed fracture with routine healing: Secondary | ICD-10-CM | POA: Diagnosis not present

## 2016-10-26 DIAGNOSIS — R451 Restlessness and agitation: Secondary | ICD-10-CM | POA: Diagnosis not present

## 2016-10-26 DIAGNOSIS — R69 Illness, unspecified: Secondary | ICD-10-CM | POA: Diagnosis not present

## 2016-10-26 DIAGNOSIS — I10 Essential (primary) hypertension: Secondary | ICD-10-CM | POA: Diagnosis not present

## 2016-10-26 DIAGNOSIS — E782 Mixed hyperlipidemia: Secondary | ICD-10-CM | POA: Diagnosis not present

## 2016-10-26 DIAGNOSIS — R4182 Altered mental status, unspecified: Secondary | ICD-10-CM | POA: Diagnosis not present

## 2016-10-26 DIAGNOSIS — Z79899 Other long term (current) drug therapy: Secondary | ICD-10-CM | POA: Diagnosis not present

## 2016-10-26 DIAGNOSIS — D518 Other vitamin B12 deficiency anemias: Secondary | ICD-10-CM | POA: Diagnosis not present

## 2016-10-26 DIAGNOSIS — E559 Vitamin D deficiency, unspecified: Secondary | ICD-10-CM | POA: Diagnosis not present

## 2016-10-26 DIAGNOSIS — E119 Type 2 diabetes mellitus without complications: Secondary | ICD-10-CM | POA: Diagnosis not present

## 2016-11-01 DIAGNOSIS — R69 Illness, unspecified: Secondary | ICD-10-CM | POA: Diagnosis not present

## 2016-11-01 DIAGNOSIS — E785 Hyperlipidemia, unspecified: Secondary | ICD-10-CM | POA: Diagnosis not present

## 2016-11-01 DIAGNOSIS — J449 Chronic obstructive pulmonary disease, unspecified: Secondary | ICD-10-CM | POA: Diagnosis not present

## 2016-11-01 DIAGNOSIS — M6281 Muscle weakness (generalized): Secondary | ICD-10-CM | POA: Diagnosis not present

## 2016-11-01 DIAGNOSIS — R2689 Other abnormalities of gait and mobility: Secondary | ICD-10-CM | POA: Diagnosis not present

## 2016-11-01 DIAGNOSIS — S7221XD Displaced subtrochanteric fracture of right femur, subsequent encounter for closed fracture with routine healing: Secondary | ICD-10-CM | POA: Diagnosis not present

## 2016-11-01 DIAGNOSIS — I10 Essential (primary) hypertension: Secondary | ICD-10-CM | POA: Diagnosis not present

## 2016-11-01 DIAGNOSIS — I739 Peripheral vascular disease, unspecified: Secondary | ICD-10-CM | POA: Diagnosis not present

## 2016-11-01 DIAGNOSIS — K5790 Diverticulosis of intestine, part unspecified, without perforation or abscess without bleeding: Secondary | ICD-10-CM | POA: Diagnosis not present

## 2016-11-02 DIAGNOSIS — S72001S Fracture of unspecified part of neck of right femur, sequela: Secondary | ICD-10-CM | POA: Diagnosis not present

## 2016-11-02 DIAGNOSIS — R350 Frequency of micturition: Secondary | ICD-10-CM | POA: Diagnosis not present

## 2016-11-02 DIAGNOSIS — Z09 Encounter for follow-up examination after completed treatment for conditions other than malignant neoplasm: Secondary | ICD-10-CM | POA: Diagnosis not present

## 2016-11-02 DIAGNOSIS — I10 Essential (primary) hypertension: Secondary | ICD-10-CM | POA: Diagnosis not present

## 2016-11-02 DIAGNOSIS — J40 Bronchitis, not specified as acute or chronic: Secondary | ICD-10-CM | POA: Diagnosis not present

## 2016-11-02 DIAGNOSIS — Z299 Encounter for prophylactic measures, unspecified: Secondary | ICD-10-CM | POA: Diagnosis not present

## 2016-11-02 DIAGNOSIS — R300 Dysuria: Secondary | ICD-10-CM | POA: Diagnosis not present

## 2016-11-02 DIAGNOSIS — C159 Malignant neoplasm of esophagus, unspecified: Secondary | ICD-10-CM | POA: Diagnosis not present

## 2016-11-02 DIAGNOSIS — Z6823 Body mass index (BMI) 23.0-23.9, adult: Secondary | ICD-10-CM | POA: Diagnosis not present

## 2016-11-07 DIAGNOSIS — K5790 Diverticulosis of intestine, part unspecified, without perforation or abscess without bleeding: Secondary | ICD-10-CM | POA: Diagnosis not present

## 2016-11-07 DIAGNOSIS — M6281 Muscle weakness (generalized): Secondary | ICD-10-CM | POA: Diagnosis not present

## 2016-11-07 DIAGNOSIS — I10 Essential (primary) hypertension: Secondary | ICD-10-CM | POA: Diagnosis not present

## 2016-11-07 DIAGNOSIS — J449 Chronic obstructive pulmonary disease, unspecified: Secondary | ICD-10-CM | POA: Diagnosis not present

## 2016-11-07 DIAGNOSIS — E785 Hyperlipidemia, unspecified: Secondary | ICD-10-CM | POA: Diagnosis not present

## 2016-11-07 DIAGNOSIS — R69 Illness, unspecified: Secondary | ICD-10-CM | POA: Diagnosis not present

## 2016-11-07 DIAGNOSIS — S7221XD Displaced subtrochanteric fracture of right femur, subsequent encounter for closed fracture with routine healing: Secondary | ICD-10-CM | POA: Diagnosis not present

## 2016-11-07 DIAGNOSIS — R2689 Other abnormalities of gait and mobility: Secondary | ICD-10-CM | POA: Diagnosis not present

## 2016-11-07 DIAGNOSIS — I739 Peripheral vascular disease, unspecified: Secondary | ICD-10-CM | POA: Diagnosis not present

## 2016-11-09 DIAGNOSIS — S7221XD Displaced subtrochanteric fracture of right femur, subsequent encounter for closed fracture with routine healing: Secondary | ICD-10-CM | POA: Diagnosis not present

## 2016-11-09 DIAGNOSIS — I10 Essential (primary) hypertension: Secondary | ICD-10-CM | POA: Diagnosis not present

## 2016-11-09 DIAGNOSIS — M6281 Muscle weakness (generalized): Secondary | ICD-10-CM | POA: Diagnosis not present

## 2016-11-09 DIAGNOSIS — J449 Chronic obstructive pulmonary disease, unspecified: Secondary | ICD-10-CM | POA: Diagnosis not present

## 2016-11-09 DIAGNOSIS — R69 Illness, unspecified: Secondary | ICD-10-CM | POA: Diagnosis not present

## 2016-11-09 DIAGNOSIS — K5790 Diverticulosis of intestine, part unspecified, without perforation or abscess without bleeding: Secondary | ICD-10-CM | POA: Diagnosis not present

## 2016-11-09 DIAGNOSIS — E785 Hyperlipidemia, unspecified: Secondary | ICD-10-CM | POA: Diagnosis not present

## 2016-11-09 DIAGNOSIS — R2689 Other abnormalities of gait and mobility: Secondary | ICD-10-CM | POA: Diagnosis not present

## 2016-11-09 DIAGNOSIS — I739 Peripheral vascular disease, unspecified: Secondary | ICD-10-CM | POA: Diagnosis not present

## 2016-11-11 DIAGNOSIS — I739 Peripheral vascular disease, unspecified: Secondary | ICD-10-CM | POA: Diagnosis not present

## 2016-11-11 DIAGNOSIS — E785 Hyperlipidemia, unspecified: Secondary | ICD-10-CM | POA: Diagnosis not present

## 2016-11-11 DIAGNOSIS — R69 Illness, unspecified: Secondary | ICD-10-CM | POA: Diagnosis not present

## 2016-11-11 DIAGNOSIS — S7221XD Displaced subtrochanteric fracture of right femur, subsequent encounter for closed fracture with routine healing: Secondary | ICD-10-CM | POA: Diagnosis not present

## 2016-11-11 DIAGNOSIS — K5790 Diverticulosis of intestine, part unspecified, without perforation or abscess without bleeding: Secondary | ICD-10-CM | POA: Diagnosis not present

## 2016-11-11 DIAGNOSIS — I10 Essential (primary) hypertension: Secondary | ICD-10-CM | POA: Diagnosis not present

## 2016-11-11 DIAGNOSIS — R2689 Other abnormalities of gait and mobility: Secondary | ICD-10-CM | POA: Diagnosis not present

## 2016-11-11 DIAGNOSIS — J449 Chronic obstructive pulmonary disease, unspecified: Secondary | ICD-10-CM | POA: Diagnosis not present

## 2016-11-11 DIAGNOSIS — M6281 Muscle weakness (generalized): Secondary | ICD-10-CM | POA: Diagnosis not present

## 2016-11-13 DIAGNOSIS — E785 Hyperlipidemia, unspecified: Secondary | ICD-10-CM | POA: Diagnosis not present

## 2016-11-13 DIAGNOSIS — I739 Peripheral vascular disease, unspecified: Secondary | ICD-10-CM | POA: Diagnosis not present

## 2016-11-13 DIAGNOSIS — K5790 Diverticulosis of intestine, part unspecified, without perforation or abscess without bleeding: Secondary | ICD-10-CM | POA: Diagnosis not present

## 2016-11-13 DIAGNOSIS — M6281 Muscle weakness (generalized): Secondary | ICD-10-CM | POA: Diagnosis not present

## 2016-11-13 DIAGNOSIS — R69 Illness, unspecified: Secondary | ICD-10-CM | POA: Diagnosis not present

## 2016-11-13 DIAGNOSIS — J449 Chronic obstructive pulmonary disease, unspecified: Secondary | ICD-10-CM | POA: Diagnosis not present

## 2016-11-13 DIAGNOSIS — R2689 Other abnormalities of gait and mobility: Secondary | ICD-10-CM | POA: Diagnosis not present

## 2016-11-13 DIAGNOSIS — I10 Essential (primary) hypertension: Secondary | ICD-10-CM | POA: Diagnosis not present

## 2016-11-13 DIAGNOSIS — S7221XD Displaced subtrochanteric fracture of right femur, subsequent encounter for closed fracture with routine healing: Secondary | ICD-10-CM | POA: Diagnosis not present

## 2016-11-14 DIAGNOSIS — J449 Chronic obstructive pulmonary disease, unspecified: Secondary | ICD-10-CM | POA: Diagnosis not present

## 2016-11-14 DIAGNOSIS — S7221XD Displaced subtrochanteric fracture of right femur, subsequent encounter for closed fracture with routine healing: Secondary | ICD-10-CM | POA: Diagnosis not present

## 2016-11-14 DIAGNOSIS — K5790 Diverticulosis of intestine, part unspecified, without perforation or abscess without bleeding: Secondary | ICD-10-CM | POA: Diagnosis not present

## 2016-11-14 DIAGNOSIS — R69 Illness, unspecified: Secondary | ICD-10-CM | POA: Diagnosis not present

## 2016-11-14 DIAGNOSIS — M6281 Muscle weakness (generalized): Secondary | ICD-10-CM | POA: Diagnosis not present

## 2016-11-14 DIAGNOSIS — I10 Essential (primary) hypertension: Secondary | ICD-10-CM | POA: Diagnosis not present

## 2016-11-14 DIAGNOSIS — I739 Peripheral vascular disease, unspecified: Secondary | ICD-10-CM | POA: Diagnosis not present

## 2016-11-14 DIAGNOSIS — R2689 Other abnormalities of gait and mobility: Secondary | ICD-10-CM | POA: Diagnosis not present

## 2016-11-14 DIAGNOSIS — E785 Hyperlipidemia, unspecified: Secondary | ICD-10-CM | POA: Diagnosis not present

## 2016-11-15 DIAGNOSIS — K5790 Diverticulosis of intestine, part unspecified, without perforation or abscess without bleeding: Secondary | ICD-10-CM | POA: Diagnosis not present

## 2016-11-15 DIAGNOSIS — S7221XD Displaced subtrochanteric fracture of right femur, subsequent encounter for closed fracture with routine healing: Secondary | ICD-10-CM | POA: Diagnosis not present

## 2016-11-15 DIAGNOSIS — I739 Peripheral vascular disease, unspecified: Secondary | ICD-10-CM | POA: Diagnosis not present

## 2016-11-15 DIAGNOSIS — J449 Chronic obstructive pulmonary disease, unspecified: Secondary | ICD-10-CM | POA: Diagnosis not present

## 2016-11-15 DIAGNOSIS — E785 Hyperlipidemia, unspecified: Secondary | ICD-10-CM | POA: Diagnosis not present

## 2016-11-15 DIAGNOSIS — R69 Illness, unspecified: Secondary | ICD-10-CM | POA: Diagnosis not present

## 2016-11-15 DIAGNOSIS — M6281 Muscle weakness (generalized): Secondary | ICD-10-CM | POA: Diagnosis not present

## 2016-11-15 DIAGNOSIS — I10 Essential (primary) hypertension: Secondary | ICD-10-CM | POA: Diagnosis not present

## 2016-11-15 DIAGNOSIS — R2689 Other abnormalities of gait and mobility: Secondary | ICD-10-CM | POA: Diagnosis not present

## 2016-11-17 DIAGNOSIS — I739 Peripheral vascular disease, unspecified: Secondary | ICD-10-CM | POA: Diagnosis not present

## 2016-11-17 DIAGNOSIS — R69 Illness, unspecified: Secondary | ICD-10-CM | POA: Diagnosis not present

## 2016-11-17 DIAGNOSIS — K5790 Diverticulosis of intestine, part unspecified, without perforation or abscess without bleeding: Secondary | ICD-10-CM | POA: Diagnosis not present

## 2016-11-17 DIAGNOSIS — I10 Essential (primary) hypertension: Secondary | ICD-10-CM | POA: Diagnosis not present

## 2016-11-17 DIAGNOSIS — J449 Chronic obstructive pulmonary disease, unspecified: Secondary | ICD-10-CM | POA: Diagnosis not present

## 2016-11-17 DIAGNOSIS — S7221XD Displaced subtrochanteric fracture of right femur, subsequent encounter for closed fracture with routine healing: Secondary | ICD-10-CM | POA: Diagnosis not present

## 2016-11-17 DIAGNOSIS — E785 Hyperlipidemia, unspecified: Secondary | ICD-10-CM | POA: Diagnosis not present

## 2016-11-17 DIAGNOSIS — M6281 Muscle weakness (generalized): Secondary | ICD-10-CM | POA: Diagnosis not present

## 2016-11-17 DIAGNOSIS — R2689 Other abnormalities of gait and mobility: Secondary | ICD-10-CM | POA: Diagnosis not present

## 2016-11-22 DIAGNOSIS — S72141D Displaced intertrochanteric fracture of right femur, subsequent encounter for closed fracture with routine healing: Secondary | ICD-10-CM | POA: Diagnosis not present

## 2016-11-23 DIAGNOSIS — J449 Chronic obstructive pulmonary disease, unspecified: Secondary | ICD-10-CM | POA: Diagnosis not present

## 2016-11-23 DIAGNOSIS — M6281 Muscle weakness (generalized): Secondary | ICD-10-CM | POA: Diagnosis not present

## 2016-11-23 DIAGNOSIS — R2689 Other abnormalities of gait and mobility: Secondary | ICD-10-CM | POA: Diagnosis not present

## 2016-11-23 DIAGNOSIS — S7221XD Displaced subtrochanteric fracture of right femur, subsequent encounter for closed fracture with routine healing: Secondary | ICD-10-CM | POA: Diagnosis not present

## 2016-11-23 DIAGNOSIS — R69 Illness, unspecified: Secondary | ICD-10-CM | POA: Diagnosis not present

## 2016-11-23 DIAGNOSIS — I10 Essential (primary) hypertension: Secondary | ICD-10-CM | POA: Diagnosis not present

## 2016-11-23 DIAGNOSIS — I739 Peripheral vascular disease, unspecified: Secondary | ICD-10-CM | POA: Diagnosis not present

## 2016-11-23 DIAGNOSIS — K5790 Diverticulosis of intestine, part unspecified, without perforation or abscess without bleeding: Secondary | ICD-10-CM | POA: Diagnosis not present

## 2016-11-23 DIAGNOSIS — E785 Hyperlipidemia, unspecified: Secondary | ICD-10-CM | POA: Diagnosis not present

## 2016-11-24 DIAGNOSIS — J449 Chronic obstructive pulmonary disease, unspecified: Secondary | ICD-10-CM | POA: Diagnosis not present

## 2016-11-24 DIAGNOSIS — M6281 Muscle weakness (generalized): Secondary | ICD-10-CM | POA: Diagnosis not present

## 2016-11-24 DIAGNOSIS — E785 Hyperlipidemia, unspecified: Secondary | ICD-10-CM | POA: Diagnosis not present

## 2016-11-24 DIAGNOSIS — R2689 Other abnormalities of gait and mobility: Secondary | ICD-10-CM | POA: Diagnosis not present

## 2016-11-24 DIAGNOSIS — I10 Essential (primary) hypertension: Secondary | ICD-10-CM | POA: Diagnosis not present

## 2016-11-24 DIAGNOSIS — R69 Illness, unspecified: Secondary | ICD-10-CM | POA: Diagnosis not present

## 2016-11-24 DIAGNOSIS — I739 Peripheral vascular disease, unspecified: Secondary | ICD-10-CM | POA: Diagnosis not present

## 2016-11-24 DIAGNOSIS — S7221XD Displaced subtrochanteric fracture of right femur, subsequent encounter for closed fracture with routine healing: Secondary | ICD-10-CM | POA: Diagnosis not present

## 2016-11-24 DIAGNOSIS — K5790 Diverticulosis of intestine, part unspecified, without perforation or abscess without bleeding: Secondary | ICD-10-CM | POA: Diagnosis not present

## 2016-11-27 DIAGNOSIS — K5790 Diverticulosis of intestine, part unspecified, without perforation or abscess without bleeding: Secondary | ICD-10-CM | POA: Diagnosis not present

## 2016-11-27 DIAGNOSIS — R2689 Other abnormalities of gait and mobility: Secondary | ICD-10-CM | POA: Diagnosis not present

## 2016-11-27 DIAGNOSIS — J449 Chronic obstructive pulmonary disease, unspecified: Secondary | ICD-10-CM | POA: Diagnosis not present

## 2016-11-27 DIAGNOSIS — I10 Essential (primary) hypertension: Secondary | ICD-10-CM | POA: Diagnosis not present

## 2016-11-27 DIAGNOSIS — R69 Illness, unspecified: Secondary | ICD-10-CM | POA: Diagnosis not present

## 2016-11-27 DIAGNOSIS — E785 Hyperlipidemia, unspecified: Secondary | ICD-10-CM | POA: Diagnosis not present

## 2016-11-27 DIAGNOSIS — S7221XD Displaced subtrochanteric fracture of right femur, subsequent encounter for closed fracture with routine healing: Secondary | ICD-10-CM | POA: Diagnosis not present

## 2016-11-27 DIAGNOSIS — I739 Peripheral vascular disease, unspecified: Secondary | ICD-10-CM | POA: Diagnosis not present

## 2016-11-27 DIAGNOSIS — M6281 Muscle weakness (generalized): Secondary | ICD-10-CM | POA: Diagnosis not present

## 2016-12-01 DIAGNOSIS — R69 Illness, unspecified: Secondary | ICD-10-CM | POA: Diagnosis not present

## 2016-12-01 DIAGNOSIS — R2689 Other abnormalities of gait and mobility: Secondary | ICD-10-CM | POA: Diagnosis not present

## 2016-12-01 DIAGNOSIS — I739 Peripheral vascular disease, unspecified: Secondary | ICD-10-CM | POA: Diagnosis not present

## 2016-12-01 DIAGNOSIS — J449 Chronic obstructive pulmonary disease, unspecified: Secondary | ICD-10-CM | POA: Diagnosis not present

## 2016-12-01 DIAGNOSIS — K5790 Diverticulosis of intestine, part unspecified, without perforation or abscess without bleeding: Secondary | ICD-10-CM | POA: Diagnosis not present

## 2016-12-01 DIAGNOSIS — S7221XD Displaced subtrochanteric fracture of right femur, subsequent encounter for closed fracture with routine healing: Secondary | ICD-10-CM | POA: Diagnosis not present

## 2016-12-01 DIAGNOSIS — M6281 Muscle weakness (generalized): Secondary | ICD-10-CM | POA: Diagnosis not present

## 2016-12-01 DIAGNOSIS — E785 Hyperlipidemia, unspecified: Secondary | ICD-10-CM | POA: Diagnosis not present

## 2016-12-01 DIAGNOSIS — I10 Essential (primary) hypertension: Secondary | ICD-10-CM | POA: Diagnosis not present

## 2016-12-04 DIAGNOSIS — I739 Peripheral vascular disease, unspecified: Secondary | ICD-10-CM | POA: Diagnosis not present

## 2016-12-04 DIAGNOSIS — I10 Essential (primary) hypertension: Secondary | ICD-10-CM | POA: Diagnosis not present

## 2016-12-04 DIAGNOSIS — R69 Illness, unspecified: Secondary | ICD-10-CM | POA: Diagnosis not present

## 2016-12-04 DIAGNOSIS — R2689 Other abnormalities of gait and mobility: Secondary | ICD-10-CM | POA: Diagnosis not present

## 2016-12-04 DIAGNOSIS — S7221XD Displaced subtrochanteric fracture of right femur, subsequent encounter for closed fracture with routine healing: Secondary | ICD-10-CM | POA: Diagnosis not present

## 2016-12-04 DIAGNOSIS — K5790 Diverticulosis of intestine, part unspecified, without perforation or abscess without bleeding: Secondary | ICD-10-CM | POA: Diagnosis not present

## 2016-12-04 DIAGNOSIS — J449 Chronic obstructive pulmonary disease, unspecified: Secondary | ICD-10-CM | POA: Diagnosis not present

## 2016-12-04 DIAGNOSIS — M6281 Muscle weakness (generalized): Secondary | ICD-10-CM | POA: Diagnosis not present

## 2016-12-04 DIAGNOSIS — E785 Hyperlipidemia, unspecified: Secondary | ICD-10-CM | POA: Diagnosis not present

## 2016-12-06 DIAGNOSIS — K5790 Diverticulosis of intestine, part unspecified, without perforation or abscess without bleeding: Secondary | ICD-10-CM | POA: Diagnosis not present

## 2016-12-06 DIAGNOSIS — M6281 Muscle weakness (generalized): Secondary | ICD-10-CM | POA: Diagnosis not present

## 2016-12-06 DIAGNOSIS — I739 Peripheral vascular disease, unspecified: Secondary | ICD-10-CM | POA: Diagnosis not present

## 2016-12-06 DIAGNOSIS — J449 Chronic obstructive pulmonary disease, unspecified: Secondary | ICD-10-CM | POA: Diagnosis not present

## 2016-12-06 DIAGNOSIS — E785 Hyperlipidemia, unspecified: Secondary | ICD-10-CM | POA: Diagnosis not present

## 2016-12-06 DIAGNOSIS — R2689 Other abnormalities of gait and mobility: Secondary | ICD-10-CM | POA: Diagnosis not present

## 2016-12-06 DIAGNOSIS — R69 Illness, unspecified: Secondary | ICD-10-CM | POA: Diagnosis not present

## 2016-12-06 DIAGNOSIS — S7221XD Displaced subtrochanteric fracture of right femur, subsequent encounter for closed fracture with routine healing: Secondary | ICD-10-CM | POA: Diagnosis not present

## 2016-12-06 DIAGNOSIS — I10 Essential (primary) hypertension: Secondary | ICD-10-CM | POA: Diagnosis not present

## 2016-12-11 DIAGNOSIS — I739 Peripheral vascular disease, unspecified: Secondary | ICD-10-CM | POA: Diagnosis not present

## 2016-12-11 DIAGNOSIS — J449 Chronic obstructive pulmonary disease, unspecified: Secondary | ICD-10-CM | POA: Diagnosis not present

## 2016-12-11 DIAGNOSIS — M6281 Muscle weakness (generalized): Secondary | ICD-10-CM | POA: Diagnosis not present

## 2016-12-11 DIAGNOSIS — K5790 Diverticulosis of intestine, part unspecified, without perforation or abscess without bleeding: Secondary | ICD-10-CM | POA: Diagnosis not present

## 2016-12-11 DIAGNOSIS — I10 Essential (primary) hypertension: Secondary | ICD-10-CM | POA: Diagnosis not present

## 2016-12-11 DIAGNOSIS — R2689 Other abnormalities of gait and mobility: Secondary | ICD-10-CM | POA: Diagnosis not present

## 2016-12-11 DIAGNOSIS — E785 Hyperlipidemia, unspecified: Secondary | ICD-10-CM | POA: Diagnosis not present

## 2016-12-11 DIAGNOSIS — R69 Illness, unspecified: Secondary | ICD-10-CM | POA: Diagnosis not present

## 2016-12-11 DIAGNOSIS — S7221XD Displaced subtrochanteric fracture of right femur, subsequent encounter for closed fracture with routine healing: Secondary | ICD-10-CM | POA: Diagnosis not present

## 2016-12-15 DIAGNOSIS — R2689 Other abnormalities of gait and mobility: Secondary | ICD-10-CM | POA: Diagnosis not present

## 2016-12-15 DIAGNOSIS — E785 Hyperlipidemia, unspecified: Secondary | ICD-10-CM | POA: Diagnosis not present

## 2016-12-15 DIAGNOSIS — K5790 Diverticulosis of intestine, part unspecified, without perforation or abscess without bleeding: Secondary | ICD-10-CM | POA: Diagnosis not present

## 2016-12-15 DIAGNOSIS — M6281 Muscle weakness (generalized): Secondary | ICD-10-CM | POA: Diagnosis not present

## 2016-12-15 DIAGNOSIS — R69 Illness, unspecified: Secondary | ICD-10-CM | POA: Diagnosis not present

## 2016-12-15 DIAGNOSIS — J449 Chronic obstructive pulmonary disease, unspecified: Secondary | ICD-10-CM | POA: Diagnosis not present

## 2016-12-15 DIAGNOSIS — I10 Essential (primary) hypertension: Secondary | ICD-10-CM | POA: Diagnosis not present

## 2016-12-15 DIAGNOSIS — I739 Peripheral vascular disease, unspecified: Secondary | ICD-10-CM | POA: Diagnosis not present

## 2016-12-15 DIAGNOSIS — S7221XD Displaced subtrochanteric fracture of right femur, subsequent encounter for closed fracture with routine healing: Secondary | ICD-10-CM | POA: Diagnosis not present

## 2016-12-18 DIAGNOSIS — I739 Peripheral vascular disease, unspecified: Secondary | ICD-10-CM | POA: Diagnosis not present

## 2016-12-18 DIAGNOSIS — E785 Hyperlipidemia, unspecified: Secondary | ICD-10-CM | POA: Diagnosis not present

## 2016-12-18 DIAGNOSIS — K5790 Diverticulosis of intestine, part unspecified, without perforation or abscess without bleeding: Secondary | ICD-10-CM | POA: Diagnosis not present

## 2016-12-18 DIAGNOSIS — J449 Chronic obstructive pulmonary disease, unspecified: Secondary | ICD-10-CM | POA: Diagnosis not present

## 2016-12-18 DIAGNOSIS — R69 Illness, unspecified: Secondary | ICD-10-CM | POA: Diagnosis not present

## 2016-12-18 DIAGNOSIS — M6281 Muscle weakness (generalized): Secondary | ICD-10-CM | POA: Diagnosis not present

## 2016-12-18 DIAGNOSIS — R2689 Other abnormalities of gait and mobility: Secondary | ICD-10-CM | POA: Diagnosis not present

## 2016-12-18 DIAGNOSIS — S7221XD Displaced subtrochanteric fracture of right femur, subsequent encounter for closed fracture with routine healing: Secondary | ICD-10-CM | POA: Diagnosis not present

## 2016-12-18 DIAGNOSIS — I10 Essential (primary) hypertension: Secondary | ICD-10-CM | POA: Diagnosis not present

## 2016-12-20 DIAGNOSIS — M6281 Muscle weakness (generalized): Secondary | ICD-10-CM | POA: Diagnosis not present

## 2016-12-20 DIAGNOSIS — E785 Hyperlipidemia, unspecified: Secondary | ICD-10-CM | POA: Diagnosis not present

## 2016-12-20 DIAGNOSIS — R2689 Other abnormalities of gait and mobility: Secondary | ICD-10-CM | POA: Diagnosis not present

## 2016-12-20 DIAGNOSIS — K5790 Diverticulosis of intestine, part unspecified, without perforation or abscess without bleeding: Secondary | ICD-10-CM | POA: Diagnosis not present

## 2016-12-20 DIAGNOSIS — I739 Peripheral vascular disease, unspecified: Secondary | ICD-10-CM | POA: Diagnosis not present

## 2016-12-20 DIAGNOSIS — S7221XD Displaced subtrochanteric fracture of right femur, subsequent encounter for closed fracture with routine healing: Secondary | ICD-10-CM | POA: Diagnosis not present

## 2016-12-20 DIAGNOSIS — I10 Essential (primary) hypertension: Secondary | ICD-10-CM | POA: Diagnosis not present

## 2016-12-20 DIAGNOSIS — J449 Chronic obstructive pulmonary disease, unspecified: Secondary | ICD-10-CM | POA: Diagnosis not present

## 2016-12-20 DIAGNOSIS — R69 Illness, unspecified: Secondary | ICD-10-CM | POA: Diagnosis not present

## 2016-12-25 DIAGNOSIS — I739 Peripheral vascular disease, unspecified: Secondary | ICD-10-CM | POA: Diagnosis not present

## 2016-12-25 DIAGNOSIS — I10 Essential (primary) hypertension: Secondary | ICD-10-CM | POA: Diagnosis not present

## 2016-12-25 DIAGNOSIS — R69 Illness, unspecified: Secondary | ICD-10-CM | POA: Diagnosis not present

## 2016-12-25 DIAGNOSIS — J449 Chronic obstructive pulmonary disease, unspecified: Secondary | ICD-10-CM | POA: Diagnosis not present

## 2016-12-25 DIAGNOSIS — E785 Hyperlipidemia, unspecified: Secondary | ICD-10-CM | POA: Diagnosis not present

## 2016-12-25 DIAGNOSIS — K5790 Diverticulosis of intestine, part unspecified, without perforation or abscess without bleeding: Secondary | ICD-10-CM | POA: Diagnosis not present

## 2016-12-25 DIAGNOSIS — M6281 Muscle weakness (generalized): Secondary | ICD-10-CM | POA: Diagnosis not present

## 2016-12-25 DIAGNOSIS — R2689 Other abnormalities of gait and mobility: Secondary | ICD-10-CM | POA: Diagnosis not present

## 2016-12-25 DIAGNOSIS — S7221XD Displaced subtrochanteric fracture of right femur, subsequent encounter for closed fracture with routine healing: Secondary | ICD-10-CM | POA: Diagnosis not present

## 2016-12-27 DIAGNOSIS — I739 Peripheral vascular disease, unspecified: Secondary | ICD-10-CM | POA: Diagnosis not present

## 2016-12-27 DIAGNOSIS — S7221XD Displaced subtrochanteric fracture of right femur, subsequent encounter for closed fracture with routine healing: Secondary | ICD-10-CM | POA: Diagnosis not present

## 2016-12-27 DIAGNOSIS — J449 Chronic obstructive pulmonary disease, unspecified: Secondary | ICD-10-CM | POA: Diagnosis not present

## 2016-12-27 DIAGNOSIS — I10 Essential (primary) hypertension: Secondary | ICD-10-CM | POA: Diagnosis not present

## 2016-12-27 DIAGNOSIS — K5790 Diverticulosis of intestine, part unspecified, without perforation or abscess without bleeding: Secondary | ICD-10-CM | POA: Diagnosis not present

## 2016-12-27 DIAGNOSIS — M6281 Muscle weakness (generalized): Secondary | ICD-10-CM | POA: Diagnosis not present

## 2016-12-27 DIAGNOSIS — R69 Illness, unspecified: Secondary | ICD-10-CM | POA: Diagnosis not present

## 2016-12-27 DIAGNOSIS — E785 Hyperlipidemia, unspecified: Secondary | ICD-10-CM | POA: Diagnosis not present

## 2016-12-27 DIAGNOSIS — R2689 Other abnormalities of gait and mobility: Secondary | ICD-10-CM | POA: Diagnosis not present

## 2017-01-15 DIAGNOSIS — Z713 Dietary counseling and surveillance: Secondary | ICD-10-CM | POA: Diagnosis not present

## 2017-01-15 DIAGNOSIS — Z299 Encounter for prophylactic measures, unspecified: Secondary | ICD-10-CM | POA: Diagnosis not present

## 2017-01-15 DIAGNOSIS — Z111 Encounter for screening for respiratory tuberculosis: Secondary | ICD-10-CM | POA: Diagnosis not present

## 2017-01-15 DIAGNOSIS — G309 Alzheimer's disease, unspecified: Secondary | ICD-10-CM | POA: Diagnosis not present

## 2017-01-15 DIAGNOSIS — C159 Malignant neoplasm of esophagus, unspecified: Secondary | ICD-10-CM | POA: Diagnosis not present

## 2017-01-15 DIAGNOSIS — J449 Chronic obstructive pulmonary disease, unspecified: Secondary | ICD-10-CM | POA: Diagnosis not present

## 2017-01-15 DIAGNOSIS — Z6823 Body mass index (BMI) 23.0-23.9, adult: Secondary | ICD-10-CM | POA: Diagnosis not present

## 2017-01-15 DIAGNOSIS — R69 Illness, unspecified: Secondary | ICD-10-CM | POA: Diagnosis not present

## 2017-01-17 DIAGNOSIS — R05 Cough: Secondary | ICD-10-CM | POA: Diagnosis not present

## 2017-02-09 DIAGNOSIS — S52531D Colles' fracture of right radius, subsequent encounter for closed fracture with routine healing: Secondary | ICD-10-CM | POA: Diagnosis not present

## 2017-02-09 DIAGNOSIS — R262 Difficulty in walking, not elsewhere classified: Secondary | ICD-10-CM | POA: Diagnosis not present

## 2017-02-09 DIAGNOSIS — G309 Alzheimer's disease, unspecified: Secondary | ICD-10-CM | POA: Diagnosis not present

## 2017-02-09 DIAGNOSIS — M84453D Pathological fracture, unspecified femur, subsequent encounter for fracture with routine healing: Secondary | ICD-10-CM | POA: Diagnosis not present

## 2017-02-09 DIAGNOSIS — R2681 Unsteadiness on feet: Secondary | ICD-10-CM | POA: Diagnosis not present

## 2017-02-09 DIAGNOSIS — R269 Unspecified abnormalities of gait and mobility: Secondary | ICD-10-CM | POA: Diagnosis not present

## 2017-02-09 DIAGNOSIS — R4701 Aphasia: Secondary | ICD-10-CM | POA: Diagnosis not present

## 2017-02-09 DIAGNOSIS — R1312 Dysphagia, oropharyngeal phase: Secondary | ICD-10-CM | POA: Diagnosis not present

## 2017-02-09 DIAGNOSIS — S12000S Unspecified displaced fracture of first cervical vertebra, sequela: Secondary | ICD-10-CM | POA: Diagnosis not present

## 2017-02-09 DIAGNOSIS — R261 Paralytic gait: Secondary | ICD-10-CM | POA: Diagnosis not present

## 2017-02-09 DIAGNOSIS — S62101D Fracture of unspecified carpal bone, right wrist, subsequent encounter for fracture with routine healing: Secondary | ICD-10-CM | POA: Diagnosis not present

## 2017-02-09 DIAGNOSIS — R2689 Other abnormalities of gait and mobility: Secondary | ICD-10-CM | POA: Diagnosis not present

## 2017-02-09 DIAGNOSIS — M6281 Muscle weakness (generalized): Secondary | ICD-10-CM | POA: Diagnosis not present

## 2017-02-09 DIAGNOSIS — M25541 Pain in joints of right hand: Secondary | ICD-10-CM | POA: Diagnosis not present

## 2017-02-09 DIAGNOSIS — R41841 Cognitive communication deficit: Secondary | ICD-10-CM | POA: Diagnosis not present

## 2017-02-12 DIAGNOSIS — K219 Gastro-esophageal reflux disease without esophagitis: Secondary | ICD-10-CM | POA: Diagnosis not present

## 2017-02-12 DIAGNOSIS — J449 Chronic obstructive pulmonary disease, unspecified: Secondary | ICD-10-CM | POA: Diagnosis not present

## 2017-02-12 DIAGNOSIS — I10 Essential (primary) hypertension: Secondary | ICD-10-CM | POA: Diagnosis not present

## 2017-02-12 DIAGNOSIS — R69 Illness, unspecified: Secondary | ICD-10-CM | POA: Diagnosis not present

## 2017-03-12 DIAGNOSIS — M84453D Pathological fracture, unspecified femur, subsequent encounter for fracture with routine healing: Secondary | ICD-10-CM | POA: Diagnosis not present

## 2017-03-12 DIAGNOSIS — R4701 Aphasia: Secondary | ICD-10-CM | POA: Diagnosis not present

## 2017-03-12 DIAGNOSIS — G309 Alzheimer's disease, unspecified: Secondary | ICD-10-CM | POA: Diagnosis not present

## 2017-03-12 DIAGNOSIS — R1312 Dysphagia, oropharyngeal phase: Secondary | ICD-10-CM | POA: Diagnosis not present

## 2017-03-12 DIAGNOSIS — R2681 Unsteadiness on feet: Secondary | ICD-10-CM | POA: Diagnosis not present

## 2017-03-12 DIAGNOSIS — R41841 Cognitive communication deficit: Secondary | ICD-10-CM | POA: Diagnosis not present

## 2017-03-19 DIAGNOSIS — K219 Gastro-esophageal reflux disease without esophagitis: Secondary | ICD-10-CM | POA: Diagnosis not present

## 2017-03-19 DIAGNOSIS — E119 Type 2 diabetes mellitus without complications: Secondary | ICD-10-CM | POA: Diagnosis not present

## 2017-03-19 DIAGNOSIS — J449 Chronic obstructive pulmonary disease, unspecified: Secondary | ICD-10-CM | POA: Diagnosis not present

## 2017-03-19 DIAGNOSIS — E785 Hyperlipidemia, unspecified: Secondary | ICD-10-CM | POA: Diagnosis not present

## 2017-03-19 DIAGNOSIS — D649 Anemia, unspecified: Secondary | ICD-10-CM | POA: Diagnosis not present

## 2017-03-20 DIAGNOSIS — I10 Essential (primary) hypertension: Secondary | ICD-10-CM | POA: Diagnosis not present

## 2017-03-20 DIAGNOSIS — F329 Major depressive disorder, single episode, unspecified: Secondary | ICD-10-CM | POA: Diagnosis not present

## 2017-03-20 DIAGNOSIS — R69 Illness, unspecified: Secondary | ICD-10-CM | POA: Diagnosis not present

## 2017-05-14 DIAGNOSIS — I10 Essential (primary) hypertension: Secondary | ICD-10-CM | POA: Diagnosis not present

## 2017-05-14 DIAGNOSIS — G309 Alzheimer's disease, unspecified: Secondary | ICD-10-CM | POA: Diagnosis not present

## 2017-05-14 DIAGNOSIS — R69 Illness, unspecified: Secondary | ICD-10-CM | POA: Diagnosis not present

## 2017-05-14 DIAGNOSIS — J449 Chronic obstructive pulmonary disease, unspecified: Secondary | ICD-10-CM | POA: Diagnosis not present

## 2017-06-12 DIAGNOSIS — R4701 Aphasia: Secondary | ICD-10-CM | POA: Diagnosis not present

## 2017-06-12 DIAGNOSIS — R1312 Dysphagia, oropharyngeal phase: Secondary | ICD-10-CM | POA: Diagnosis not present

## 2017-06-12 DIAGNOSIS — G309 Alzheimer's disease, unspecified: Secondary | ICD-10-CM | POA: Diagnosis not present

## 2017-06-12 DIAGNOSIS — R41841 Cognitive communication deficit: Secondary | ICD-10-CM | POA: Diagnosis not present

## 2017-06-13 DIAGNOSIS — R41841 Cognitive communication deficit: Secondary | ICD-10-CM | POA: Diagnosis not present

## 2017-06-13 DIAGNOSIS — R4701 Aphasia: Secondary | ICD-10-CM | POA: Diagnosis not present

## 2017-06-13 DIAGNOSIS — R1312 Dysphagia, oropharyngeal phase: Secondary | ICD-10-CM | POA: Diagnosis not present

## 2017-06-13 DIAGNOSIS — G309 Alzheimer's disease, unspecified: Secondary | ICD-10-CM | POA: Diagnosis not present

## 2017-06-14 DIAGNOSIS — R1312 Dysphagia, oropharyngeal phase: Secondary | ICD-10-CM | POA: Diagnosis not present

## 2017-06-14 DIAGNOSIS — G309 Alzheimer's disease, unspecified: Secondary | ICD-10-CM | POA: Diagnosis not present

## 2017-06-14 DIAGNOSIS — R41841 Cognitive communication deficit: Secondary | ICD-10-CM | POA: Diagnosis not present

## 2017-06-14 DIAGNOSIS — R4701 Aphasia: Secondary | ICD-10-CM | POA: Diagnosis not present

## 2017-06-15 DIAGNOSIS — R4701 Aphasia: Secondary | ICD-10-CM | POA: Diagnosis not present

## 2017-06-15 DIAGNOSIS — R41841 Cognitive communication deficit: Secondary | ICD-10-CM | POA: Diagnosis not present

## 2017-06-15 DIAGNOSIS — R1312 Dysphagia, oropharyngeal phase: Secondary | ICD-10-CM | POA: Diagnosis not present

## 2017-06-15 DIAGNOSIS — G309 Alzheimer's disease, unspecified: Secondary | ICD-10-CM | POA: Diagnosis not present

## 2017-06-18 DIAGNOSIS — R41841 Cognitive communication deficit: Secondary | ICD-10-CM | POA: Diagnosis not present

## 2017-06-18 DIAGNOSIS — G309 Alzheimer's disease, unspecified: Secondary | ICD-10-CM | POA: Diagnosis not present

## 2017-06-18 DIAGNOSIS — R1312 Dysphagia, oropharyngeal phase: Secondary | ICD-10-CM | POA: Diagnosis not present

## 2017-06-18 DIAGNOSIS — R4701 Aphasia: Secondary | ICD-10-CM | POA: Diagnosis not present

## 2017-06-19 DIAGNOSIS — R1312 Dysphagia, oropharyngeal phase: Secondary | ICD-10-CM | POA: Diagnosis not present

## 2017-06-19 DIAGNOSIS — R41841 Cognitive communication deficit: Secondary | ICD-10-CM | POA: Diagnosis not present

## 2017-06-19 DIAGNOSIS — G309 Alzheimer's disease, unspecified: Secondary | ICD-10-CM | POA: Diagnosis not present

## 2017-06-19 DIAGNOSIS — R4701 Aphasia: Secondary | ICD-10-CM | POA: Diagnosis not present

## 2017-06-20 DIAGNOSIS — G309 Alzheimer's disease, unspecified: Secondary | ICD-10-CM | POA: Diagnosis not present

## 2017-06-20 DIAGNOSIS — R41841 Cognitive communication deficit: Secondary | ICD-10-CM | POA: Diagnosis not present

## 2017-06-20 DIAGNOSIS — R4701 Aphasia: Secondary | ICD-10-CM | POA: Diagnosis not present

## 2017-06-20 DIAGNOSIS — R1312 Dysphagia, oropharyngeal phase: Secondary | ICD-10-CM | POA: Diagnosis not present

## 2017-06-21 DIAGNOSIS — R41841 Cognitive communication deficit: Secondary | ICD-10-CM | POA: Diagnosis not present

## 2017-06-21 DIAGNOSIS — R4701 Aphasia: Secondary | ICD-10-CM | POA: Diagnosis not present

## 2017-06-21 DIAGNOSIS — R1312 Dysphagia, oropharyngeal phase: Secondary | ICD-10-CM | POA: Diagnosis not present

## 2017-06-21 DIAGNOSIS — G309 Alzheimer's disease, unspecified: Secondary | ICD-10-CM | POA: Diagnosis not present

## 2017-06-22 DIAGNOSIS — R4701 Aphasia: Secondary | ICD-10-CM | POA: Diagnosis not present

## 2017-06-22 DIAGNOSIS — G309 Alzheimer's disease, unspecified: Secondary | ICD-10-CM | POA: Diagnosis not present

## 2017-06-22 DIAGNOSIS — R1312 Dysphagia, oropharyngeal phase: Secondary | ICD-10-CM | POA: Diagnosis not present

## 2017-06-22 DIAGNOSIS — R41841 Cognitive communication deficit: Secondary | ICD-10-CM | POA: Diagnosis not present

## 2017-06-25 DIAGNOSIS — G309 Alzheimer's disease, unspecified: Secondary | ICD-10-CM | POA: Diagnosis not present

## 2017-06-25 DIAGNOSIS — R41841 Cognitive communication deficit: Secondary | ICD-10-CM | POA: Diagnosis not present

## 2017-06-25 DIAGNOSIS — R1312 Dysphagia, oropharyngeal phase: Secondary | ICD-10-CM | POA: Diagnosis not present

## 2017-06-25 DIAGNOSIS — R4701 Aphasia: Secondary | ICD-10-CM | POA: Diagnosis not present

## 2017-06-26 DIAGNOSIS — R1312 Dysphagia, oropharyngeal phase: Secondary | ICD-10-CM | POA: Diagnosis not present

## 2017-06-26 DIAGNOSIS — G309 Alzheimer's disease, unspecified: Secondary | ICD-10-CM | POA: Diagnosis not present

## 2017-06-26 DIAGNOSIS — R41841 Cognitive communication deficit: Secondary | ICD-10-CM | POA: Diagnosis not present

## 2017-06-26 DIAGNOSIS — R4701 Aphasia: Secondary | ICD-10-CM | POA: Diagnosis not present

## 2017-06-28 DIAGNOSIS — G309 Alzheimer's disease, unspecified: Secondary | ICD-10-CM | POA: Diagnosis not present

## 2017-06-28 DIAGNOSIS — R41841 Cognitive communication deficit: Secondary | ICD-10-CM | POA: Diagnosis not present

## 2017-06-28 DIAGNOSIS — R1312 Dysphagia, oropharyngeal phase: Secondary | ICD-10-CM | POA: Diagnosis not present

## 2017-06-28 DIAGNOSIS — R4701 Aphasia: Secondary | ICD-10-CM | POA: Diagnosis not present

## 2017-06-29 DIAGNOSIS — R41841 Cognitive communication deficit: Secondary | ICD-10-CM | POA: Diagnosis not present

## 2017-06-29 DIAGNOSIS — G309 Alzheimer's disease, unspecified: Secondary | ICD-10-CM | POA: Diagnosis not present

## 2017-06-29 DIAGNOSIS — R4701 Aphasia: Secondary | ICD-10-CM | POA: Diagnosis not present

## 2017-06-29 DIAGNOSIS — R1312 Dysphagia, oropharyngeal phase: Secondary | ICD-10-CM | POA: Diagnosis not present

## 2017-07-03 DIAGNOSIS — R69 Illness, unspecified: Secondary | ICD-10-CM | POA: Diagnosis not present

## 2017-07-03 DIAGNOSIS — I517 Cardiomegaly: Secondary | ICD-10-CM | POA: Diagnosis not present

## 2017-07-03 DIAGNOSIS — R319 Hematuria, unspecified: Secondary | ICD-10-CM | POA: Diagnosis not present

## 2017-07-03 DIAGNOSIS — D649 Anemia, unspecified: Secondary | ICD-10-CM | POA: Diagnosis not present

## 2017-07-03 DIAGNOSIS — I10 Essential (primary) hypertension: Secondary | ICD-10-CM | POA: Diagnosis not present

## 2017-07-03 DIAGNOSIS — R509 Fever, unspecified: Secondary | ICD-10-CM | POA: Diagnosis not present

## 2017-07-03 DIAGNOSIS — J449 Chronic obstructive pulmonary disease, unspecified: Secondary | ICD-10-CM | POA: Diagnosis not present

## 2017-08-20 DIAGNOSIS — R69 Illness, unspecified: Secondary | ICD-10-CM | POA: Diagnosis not present

## 2017-08-27 DIAGNOSIS — I10 Essential (primary) hypertension: Secondary | ICD-10-CM | POA: Diagnosis not present

## 2017-08-27 DIAGNOSIS — J449 Chronic obstructive pulmonary disease, unspecified: Secondary | ICD-10-CM | POA: Diagnosis not present

## 2017-08-27 DIAGNOSIS — R69 Illness, unspecified: Secondary | ICD-10-CM | POA: Diagnosis not present

## 2017-09-08 DIAGNOSIS — R509 Fever, unspecified: Secondary | ICD-10-CM | POA: Diagnosis not present

## 2017-10-30 DIAGNOSIS — R69 Illness, unspecified: Secondary | ICD-10-CM | POA: Diagnosis not present

## 2017-10-30 DIAGNOSIS — I10 Essential (primary) hypertension: Secondary | ICD-10-CM | POA: Diagnosis not present

## 2017-10-30 DIAGNOSIS — G309 Alzheimer's disease, unspecified: Secondary | ICD-10-CM | POA: Diagnosis not present

## 2017-12-12 ENCOUNTER — Emergency Department (HOSPITAL_COMMUNITY)
Admission: EM | Admit: 2017-12-12 | Discharge: 2017-12-12 | Disposition: A | Discharge status: Skilled Nursing Facility | Payer: Medicare HMO | Attending: Emergency Medicine | Admitting: Emergency Medicine

## 2017-12-12 ENCOUNTER — Other Ambulatory Visit: Payer: Self-pay

## 2017-12-12 ENCOUNTER — Emergency Department (HOSPITAL_COMMUNITY): Payer: Medicare HMO

## 2017-12-12 ENCOUNTER — Encounter (HOSPITAL_COMMUNITY): Payer: Self-pay | Admitting: Emergency Medicine

## 2017-12-12 DIAGNOSIS — S199XXA Unspecified injury of neck, initial encounter: Secondary | ICD-10-CM | POA: Diagnosis present

## 2017-12-12 DIAGNOSIS — I10 Essential (primary) hypertension: Secondary | ICD-10-CM | POA: Insufficient documentation

## 2017-12-12 DIAGNOSIS — R69 Illness, unspecified: Secondary | ICD-10-CM | POA: Diagnosis not present

## 2017-12-12 DIAGNOSIS — Y999 Unspecified external cause status: Secondary | ICD-10-CM | POA: Insufficient documentation

## 2017-12-12 DIAGNOSIS — S0990XA Unspecified injury of head, initial encounter: Secondary | ICD-10-CM | POA: Diagnosis not present

## 2017-12-12 DIAGNOSIS — J449 Chronic obstructive pulmonary disease, unspecified: Secondary | ICD-10-CM | POA: Insufficient documentation

## 2017-12-12 DIAGNOSIS — Y939 Activity, unspecified: Secondary | ICD-10-CM | POA: Insufficient documentation

## 2017-12-12 DIAGNOSIS — S0003XA Contusion of scalp, initial encounter: Secondary | ICD-10-CM | POA: Diagnosis not present

## 2017-12-12 DIAGNOSIS — R072 Precordial pain: Secondary | ICD-10-CM | POA: Diagnosis not present

## 2017-12-12 DIAGNOSIS — W19XXXA Unspecified fall, initial encounter: Secondary | ICD-10-CM

## 2017-12-12 DIAGNOSIS — F17210 Nicotine dependence, cigarettes, uncomplicated: Secondary | ICD-10-CM | POA: Diagnosis not present

## 2017-12-12 DIAGNOSIS — S0083XA Contusion of other part of head, initial encounter: Secondary | ICD-10-CM | POA: Insufficient documentation

## 2017-12-12 DIAGNOSIS — F0390 Unspecified dementia without behavioral disturbance: Secondary | ICD-10-CM | POA: Diagnosis not present

## 2017-12-12 DIAGNOSIS — R0789 Other chest pain: Secondary | ICD-10-CM | POA: Diagnosis not present

## 2017-12-12 DIAGNOSIS — R079 Chest pain, unspecified: Secondary | ICD-10-CM | POA: Diagnosis not present

## 2017-12-12 DIAGNOSIS — S12001A Unspecified nondisplaced fracture of first cervical vertebra, initial encounter for closed fracture: Secondary | ICD-10-CM

## 2017-12-12 DIAGNOSIS — Y929 Unspecified place or not applicable: Secondary | ICD-10-CM | POA: Insufficient documentation

## 2017-12-12 DIAGNOSIS — R279 Unspecified lack of coordination: Secondary | ICD-10-CM | POA: Diagnosis not present

## 2017-12-12 DIAGNOSIS — D001 Carcinoma in situ of esophagus: Secondary | ICD-10-CM | POA: Insufficient documentation

## 2017-12-12 DIAGNOSIS — Z7401 Bed confinement status: Secondary | ICD-10-CM | POA: Diagnosis not present

## 2017-12-12 DIAGNOSIS — S12090A Other displaced fracture of first cervical vertebra, initial encounter for closed fracture: Secondary | ICD-10-CM | POA: Diagnosis not present

## 2017-12-12 LAB — BASIC METABOLIC PANEL
ANION GAP: 7 (ref 5–15)
BUN: 17 mg/dL (ref 8–23)
CALCIUM: 8.6 mg/dL — AB (ref 8.9–10.3)
CO2: 27 mmol/L (ref 22–32)
Chloride: 105 mmol/L (ref 98–111)
Creatinine, Ser: 0.85 mg/dL (ref 0.44–1.00)
Glucose, Bld: 132 mg/dL — ABNORMAL HIGH (ref 70–99)
Potassium: 3.9 mmol/L (ref 3.5–5.1)
SODIUM: 139 mmol/L (ref 135–145)

## 2017-12-12 LAB — TROPONIN I

## 2017-12-12 MED ORDER — FENTANYL CITRATE (PF) 100 MCG/2ML IJ SOLN
25.0000 ug | Freq: Once | INTRAMUSCULAR | Status: AC
  Administered 2017-12-12: 25 ug via INTRAVENOUS
  Filled 2017-12-12: qty 2

## 2017-12-12 NOTE — ED Provider Notes (Signed)
Emergency Department Provider Note   I have reviewed the triage vital signs and the nursing notes.   HISTORY  Chief Complaint Fall ((chest pain))   HPI Jamie [name] is a 84 y.o. female with PMH of dementia, COPD, HLD, and HTN presents to the emergency department by EMS after fall at approximately 11 PM.  The staff at [facility] called to provide history.  They noticed swelling/hematoma to the patient's right forehead and noted the patient was complaining of chest pain.  Patient does not remember the fall but does have a history of dementia.  Unknown if the chest pain began before or after falling.   Level 5 caveat: Dementia.    Past Medical History:  Diagnosis Date  . Adenomatous colon polyp 12/1993  . Allergy    lipitor  . Anxiety   . COPD (chronic obstructive pulmonary disease) ([place])   . Dementia   . Diverticulosis   . Dysphagia    to solids and pills .  On left side  . Esophageal cancer ([place]) 05/15/12   bx=, middle,Invasive squamous cell ca,  . Hot flashes   . Hyperlipidemia   . Hypertension   . Mitral valve insufficiency   . Osteoporosis     Patient Active Problem List   Diagnosis Date Noted  . S/P ORIF (open reduction internal fixation) fracture   . Closed fracture of subtrochanteric section of femur, right, initial encounter ([place]) 09/13/2016  . History of esophageal cancer 09/13/2016  . Dementia 09/13/2016  . Carcinoma of middle third of esophagus ([place]) 05/24/2012  . Dysphagia   . Esophageal cancer ([place]) 05/15/2012    Past Surgical History:  Procedure Laterality Date  . ABDOMINAL HYSTERECTOMY  1245,8099   salpingo-opherctomy 1986  . ANTERIOR AND POSTERIOR VAGINAL REPAIR W/ SACROSPINOUS LIGAMENT SUSPENSION    . Biopsy of Esophagus  05/15/12   Invasive Squamous Cell Carcinoma  . colon polyps     hx adenomatous  . COLONOSCOPY  2008   neg  . EUS  06/03/2012   Procedure: UPPER ENDOSCOPIC ULTRASOUND (EUS) LINEAR;  Surgeon: Milus Banister, MD;  Location:  [facility];  Service: Endoscopy;  Laterality: N/A;  +/- radial and liner   . EXPLORATORY LAPAROTOMY  1985  . FEMUR IM NAIL Right 09/14/2016   Procedure: INTRAMEDULLARY (IM) NAIL FEMORAL;  Surgeon: Marchia Bond, MD;  Location: [facility];  Service: Orthopedics;  Laterality: Right;  . orif left elbow  05/13/08   s/p fall, olecranon fracture  . TONSILLECTOMY    . TUBAL LIGATION      Allergies Codeine and Lipitor [atorvastatin]  Family History  Problem Relation Age of Onset  . CVA Mother   . Lung cancer Mother   . Alzheimer's disease Mother   . Cancer Mother   . Kidney failure Father   . Colon polyps Daughter     Social History Social History   Tobacco Use  . Smoking status: Current Every Day Smoker    Packs/day: 0.50    Years: 30.00    Pack years: 15.00    Types: Cigarettes  . Smokeless tobacco: Never Used  Substance Use Topics  . Alcohol use: No  . Drug use: No    Comment: 30+years smoking<1ppd    Review of Systems  Level 5 caveat: Dementia.   ____________________________________________   PHYSICAL EXAM:  VITAL SIGNS: ED Triage Vitals [12/12/17 0322]  Enc Vitals Group     BP (!) 197/82     Pulse Rate 61  Resp 13     Temp 98.2 F (36.8 C)     Temp Source Oral     SpO2 90 %   Constitutional: Alert but confused (baseline). Well appearing and in no acute distress. Eyes: Conjunctivae are normal. PERRL.  Head: hematoma to the right forehead without laceration.  Nose: No congestion/rhinnorhea. Mouth/Throat: Mucous membranes are moist.  Neck: No stridor. No cervical spine tenderness to palpation. Cardiovascular: Normal rate, regular rhythm. Good peripheral circulation. Grossly normal heart sounds.   Respiratory: Normal respiratory effort.  No retractions. Lungs CTAB. Gastrointestinal: Soft and nontender. No distention.  Musculoskeletal: No lower extremity tenderness nor edema. No gross deformities of extremities. Tenderness to palpation over the inferior  sternum. No deformity, bruising, or crepitus.  Neurologic:  Normal speech and language. No gross focal neurologic deficits are appreciated.  Skin:  Skin is warm, dry and intact. No rash noted.  ____________________________________________   LABS (all labs ordered are listed, but only abnormal results are displayed)  Labs Reviewed  BASIC METABOLIC PANEL - Abnormal; Notable for the following components:      Result Value   Glucose, Bld 132 (*)    Calcium 8.6 (*)    All other components within normal limits  TROPONIN I  CBC WITH DIFFERENTIAL/PLATELET  CBC WITH DIFFERENTIAL/PLATELET   ____________________________________________  EKG   EKG Interpretation  Date/Time:  Wednesday December 12 2017 03:19:42 EDT Ventricular Rate:  64 PR Interval:    QRS Duration: 87 QT Interval:  494 QTC Calculation: 510 R Axis:   7 Text Interpretation:  Sinus rhythm Abnormal R-wave progression, early transition Prolonged QT interval No STEMI.  Confirmed by Nanda Quinton [phone]) on 12/12/2017 3:27:53 AM       ____________________________________________  RADIOLOGY  Dg Chest 2 View  Result Date: 12/12/2017 CLINICAL DATA:  Chest pain after a fall. History of COPD, hypertension, osteoporosis, current smoker. EXAM: CHEST - 2 VIEW COMPARISON:  01/17/2017 FINDINGS: Shallow inspiration. Mild cardiac enlargement. Pulmonary vascularity is normal. Interstitial changes in the lungs likely due to fibrosis or chronic bronchitic change. No airspace disease or consolidation. No blunting of costophrenic angles. No pneumothorax. Small amount of fluid or thickening in the right minor fissure. Calcification of the aorta. Degenerative changes in the spine and shoulders. IMPRESSION: Fibrosis and chronic bronchitic changes in the lungs. No evidence of active pulmonary disease. Electronically Signed   By: Lucienne Capers M.D.   On: 12/12/2017 03:56   Ct Head Wo Contrast  Result Date: 12/12/2017 CLINICAL DATA:  Patient fell at  2300 hours. Hematoma to the right forehead. EXAM: CT HEAD WITHOUT CONTRAST CT CERVICAL SPINE WITHOUT CONTRAST TECHNIQUE: Multidetector CT imaging of the head and cervical spine was performed following the standard protocol without intravenous contrast. Multiplanar CT image reconstructions of the cervical spine were also generated. COMPARISON:  CT head 09/17/2013 FINDINGS: CT HEAD FINDINGS Brain: Diffuse cerebral atrophy. Ventricular dilatation consistent with central atrophy. Low-attenuation changes in the deep white matter consistent small vessel ischemia. No mass-effect or midline shift. No abnormal extra-axial fluid collections. Gray-white matter junctions are distinct. Basal cisterns are not effaced. No acute intracranial hemorrhage. Vascular: No hyperdense vessel or unexpected calcification. Skull: Normal. Negative for fracture or focal lesion. Sinuses/Orbits: Mucosal thickening in the paranasal sinuses. No acute air-fluid levels. Diffuse opacification of the left mastoid air cells and partial opacification of right mastoid air cells. Other: Subcutaneous scalp hematoma over the right anterior frontal region. CT CERVICAL SPINE FINDINGS Alignment: Slight anterior subluxation of C4 on C5. Normal alignment  of the facet joints. Skull base and vertebrae: Skull base appears intact. There are mildly displaced fractures of the C1 vertebra involving the left anterior ramus and bilateral posterior lamina. Articulation with the occiput and C2 vertebra appear intact. Possible corner fracture of the anterior superior C5 vertebral body. Consider MRI for further evaluation at C4-5 to exclude fracture or ligamentous injury. No additional fractures are identified. Soft tissues and spinal canal: No prevertebral soft tissue swelling. No paraspinal soft tissue mass or infiltration. Disc levels: Degenerative changes throughout the cervical spine with narrowed interspaces and endplate hypertrophic changes throughout. Degenerative  changes throughout the facet joints. Degenerative changes at C1-2 resulting in loss of the anterior space. Upper chest: Scarring in the lung apices. Other: None. IMPRESSION: 1. No acute intracranial abnormalities. Chronic atrophy and small vessel ischemic changes. Subcutaneous scalp hematoma over the right anterior frontal region. 2. Mildly displaced fractures of the C1 vertebra involving the left anterior ramus and bilateral posterior lamina. This fracture involves anterior and posterior column and represents a potentially unstable fracture. 3. Possible corner fracture of the anterior superior C5 vertebral body with slight anterior subluxation at C4-5. Consider MRI for further evaluation at this time. No acute displaced fractures identified in the cervical spine. 4. These results were called by telephone at the time of interpretation on 12/12/2017 at 4:16 am to Dr. Nanda Quinton , who verbally acknowledged these results. Electronically Signed   By: Lucienne Capers M.D.   On: 12/12/2017 04:19   Ct Cervical Spine Wo Contrast  Result Date: 12/12/2017 CLINICAL DATA:  Patient fell at 2300 hours. Hematoma to the right forehead. EXAM: CT HEAD WITHOUT CONTRAST CT CERVICAL SPINE WITHOUT CONTRAST TECHNIQUE: Multidetector CT imaging of the head and cervical spine was performed following the standard protocol without intravenous contrast. Multiplanar CT image reconstructions of the cervical spine were also generated. COMPARISON:  CT head 09/17/2013 FINDINGS: CT HEAD FINDINGS Brain: Diffuse cerebral atrophy. Ventricular dilatation consistent with central atrophy. Low-attenuation changes in the deep white matter consistent small vessel ischemia. No mass-effect or midline shift. No abnormal extra-axial fluid collections. Gray-white matter junctions are distinct. Basal cisterns are not effaced. No acute intracranial hemorrhage. Vascular: No hyperdense vessel or unexpected calcification. Skull: Normal. Negative for fracture or  focal lesion. Sinuses/Orbits: Mucosal thickening in the paranasal sinuses. No acute air-fluid levels. Diffuse opacification of the left mastoid air cells and partial opacification of right mastoid air cells. Other: Subcutaneous scalp hematoma over the right anterior frontal region. CT CERVICAL SPINE FINDINGS Alignment: Slight anterior subluxation of C4 on C5. Normal alignment of the facet joints. Skull base and vertebrae: Skull base appears intact. There are mildly displaced fractures of the C1 vertebra involving the left anterior ramus and bilateral posterior lamina. Articulation with the occiput and C2 vertebra appear intact. Possible corner fracture of the anterior superior C5 vertebral body. Consider MRI for further evaluation at C4-5 to exclude fracture or ligamentous injury. No additional fractures are identified. Soft tissues and spinal canal: No prevertebral soft tissue swelling. No paraspinal soft tissue mass or infiltration. Disc levels: Degenerative changes throughout the cervical spine with narrowed interspaces and endplate hypertrophic changes throughout. Degenerative changes throughout the facet joints. Degenerative changes at C1-2 resulting in loss of the anterior space. Upper chest: Scarring in the lung apices. Other: None. IMPRESSION: 1. No acute intracranial abnormalities. Chronic atrophy and small vessel ischemic changes. Subcutaneous scalp hematoma over the right anterior frontal region. 2. Mildly displaced fractures of the C1 vertebra involving the left anterior ramus  and bilateral posterior lamina. This fracture involves anterior and posterior column and represents a potentially unstable fracture. 3. Possible corner fracture of the anterior superior C5 vertebral body with slight anterior subluxation at C4-5. Consider MRI for further evaluation at this time. No acute displaced fractures identified in the cervical spine. 4. These results were called by telephone at the time of interpretation on  12/12/2017 at 4:16 am to Dr. Nanda Quinton , who verbally acknowledged these results. Electronically Signed   By: Lucienne Capers M.D.   On: 12/12/2017 04:19    ____________________________________________   PROCEDURES  Procedure(s) performed:   Procedures  None ____________________________________________   INITIAL IMPRESSION / ASSESSMENT AND PLAN / ED COURSE  Pertinent labs & imaging results that were available during my care of the patient were reviewed by me and considered in my medical decision making (see chart for details).  Patient presents to the emergency department for evaluation of head injury after fall.  She is complaining of chest discomfort and has tenderness to palpation over the inferior sternum.  I do not appreciate any chest wall deformity.  Her pain is worse with palpation and deep breathing.  Suspect that this is related to fall injury.  Patient is requiring supplemental nasal cannula oxygen on arrival.  She is afebrile.  History is limited by the patient's underlying dementia.  Plan for labs including troponin.  EKG reviewed which shows no acute findings.  CT imaging of the head, neck, chest x-ray are planned.   04:51 AM Spoke with Dr. Vertell Limber with Neurosurgery regarding the C-spine fracture. His recommendation is c-collar at all times and office follow up in 2-3 weeks with him for x-rays. No indication at this time for MRI. Patient is tolerating the collar well at this time. CXR reviewed with no fracture. Suspect chest wall contusion. Patient with only chronic changes on CXR and h/o smoking in the past. Following labs and will discharge.   Labs unremarkable. Will discharge. Patient's daughter at bedside and comfortable with the plan at discharge.   At this time, I do not feel there is any life-threatening condition present. I have reviewed and discussed all results (EKG, imaging, lab, urine as appropriate), exam findings with patient. I have reviewed nursing notes and  appropriate previous records.  I feel the patient is safe to be discharged home without further emergent workup. Discussed usual and customary return precautions. Patient and family (if present) verbalize understanding and are comfortable with this plan.  Patient will follow-up with their primary care provider. If they do not have a primary care provider, information for follow-up has been provided to them. All questions have been answered.  ____________________________________________  FINAL CLINICAL IMPRESSION(S) / ED DIAGNOSES  Final diagnoses:  Closed nondisplaced fracture of first cervical vertebra, unspecified fracture morphology, initial encounter ([employer])  Fall, initial encounter  Injury of head, initial encounter  Precordial chest pain    MEDICATIONS GIVEN DURING THIS VISIT:  Medications  fentaNYL (SUBLIMAZE) injection 25 mcg (25 mcg Intravenous Given 12/12/17 0328)    Note:  This document was prepared using Dragon voice recognition software and may include unintentional dictation errors.  Nanda Quinton, MD Emergency Medicine    Long, [facility], MD 12/12/17 [id number]

## 2017-12-12 NOTE — Discharge Instructions (Signed)
You were seen in the ED today after a fall. Your chest x-ray and CT scan of the head are normal. The CT scan of your neck shows a fracture of the C1 vertebra. This was reviewed with our Neurosurgeon, Dr. Vertell Limber, who recommends wearing the cervical spine collar at all times and calling to schedule a follow up appointment in 2-3 weeks. They will repeat x-rays at that time.   Take Tylenol for chest pain and neck pain. Return to the ED with worsening chest pain, neck pain, or weakness/numbness in the arms/legs.

## 2017-12-12 NOTE — ED Triage Notes (Signed)
Pt brought in by [facility]. Per Benita at [facility] pt had a fall around 2300. Pt now C/O chest pain upon palpation. Pt also has hematoma to the right forehead.

## 2017-12-12 NOTE — ED Notes (Signed)
Pt up with assistance to bedside toilet and back to bed

## 2017-12-12 NOTE — ED Notes (Signed)
Pt daughter and POA at bedside. Reports  Pt mental status is at baseline. Pt alert and oriented x1 (first name only).

## 2017-12-17 DIAGNOSIS — R261 Paralytic gait: Secondary | ICD-10-CM | POA: Diagnosis not present

## 2017-12-17 DIAGNOSIS — R41841 Cognitive communication deficit: Secondary | ICD-10-CM | POA: Diagnosis not present

## 2017-12-17 DIAGNOSIS — R1312 Dysphagia, oropharyngeal phase: Secondary | ICD-10-CM | POA: Diagnosis not present

## 2017-12-17 DIAGNOSIS — G309 Alzheimer's disease, unspecified: Secondary | ICD-10-CM | POA: Diagnosis not present

## 2017-12-17 DIAGNOSIS — S12000S Unspecified displaced fracture of first cervical vertebra, sequela: Secondary | ICD-10-CM | POA: Diagnosis not present

## 2017-12-17 DIAGNOSIS — R4701 Aphasia: Secondary | ICD-10-CM | POA: Diagnosis not present

## 2017-12-17 DIAGNOSIS — M150 Primary generalized (osteo)arthritis: Secondary | ICD-10-CM | POA: Diagnosis not present

## 2017-12-18 DIAGNOSIS — R41841 Cognitive communication deficit: Secondary | ICD-10-CM | POA: Diagnosis not present

## 2017-12-18 DIAGNOSIS — S12000S Unspecified displaced fracture of first cervical vertebra, sequela: Secondary | ICD-10-CM | POA: Diagnosis not present

## 2017-12-18 DIAGNOSIS — R4701 Aphasia: Secondary | ICD-10-CM | POA: Diagnosis not present

## 2017-12-18 DIAGNOSIS — R1312 Dysphagia, oropharyngeal phase: Secondary | ICD-10-CM | POA: Diagnosis not present

## 2017-12-18 DIAGNOSIS — G309 Alzheimer's disease, unspecified: Secondary | ICD-10-CM | POA: Diagnosis not present

## 2017-12-18 DIAGNOSIS — R261 Paralytic gait: Secondary | ICD-10-CM | POA: Diagnosis not present

## 2017-12-19 DIAGNOSIS — R1312 Dysphagia, oropharyngeal phase: Secondary | ICD-10-CM | POA: Diagnosis not present

## 2017-12-19 DIAGNOSIS — R261 Paralytic gait: Secondary | ICD-10-CM | POA: Diagnosis not present

## 2017-12-19 DIAGNOSIS — G309 Alzheimer's disease, unspecified: Secondary | ICD-10-CM | POA: Diagnosis not present

## 2017-12-19 DIAGNOSIS — S12000S Unspecified displaced fracture of first cervical vertebra, sequela: Secondary | ICD-10-CM | POA: Diagnosis not present

## 2017-12-19 DIAGNOSIS — R4701 Aphasia: Secondary | ICD-10-CM | POA: Diagnosis not present

## 2017-12-19 DIAGNOSIS — R41841 Cognitive communication deficit: Secondary | ICD-10-CM | POA: Diagnosis not present

## 2017-12-20 DIAGNOSIS — G309 Alzheimer's disease, unspecified: Secondary | ICD-10-CM | POA: Diagnosis not present

## 2017-12-20 DIAGNOSIS — R69 Illness, unspecified: Secondary | ICD-10-CM | POA: Diagnosis not present

## 2017-12-20 DIAGNOSIS — I10 Essential (primary) hypertension: Secondary | ICD-10-CM | POA: Diagnosis not present

## 2017-12-20 DIAGNOSIS — R41841 Cognitive communication deficit: Secondary | ICD-10-CM | POA: Diagnosis not present

## 2017-12-20 DIAGNOSIS — S12000S Unspecified displaced fracture of first cervical vertebra, sequela: Secondary | ICD-10-CM | POA: Diagnosis not present

## 2017-12-20 DIAGNOSIS — R4701 Aphasia: Secondary | ICD-10-CM | POA: Diagnosis not present

## 2017-12-20 DIAGNOSIS — R1312 Dysphagia, oropharyngeal phase: Secondary | ICD-10-CM | POA: Diagnosis not present

## 2017-12-20 DIAGNOSIS — R261 Paralytic gait: Secondary | ICD-10-CM | POA: Diagnosis not present

## 2017-12-21 DIAGNOSIS — R1312 Dysphagia, oropharyngeal phase: Secondary | ICD-10-CM | POA: Diagnosis not present

## 2017-12-21 DIAGNOSIS — R41841 Cognitive communication deficit: Secondary | ICD-10-CM | POA: Diagnosis not present

## 2017-12-21 DIAGNOSIS — R4701 Aphasia: Secondary | ICD-10-CM | POA: Diagnosis not present

## 2017-12-21 DIAGNOSIS — G309 Alzheimer's disease, unspecified: Secondary | ICD-10-CM | POA: Diagnosis not present

## 2017-12-21 DIAGNOSIS — S12000S Unspecified displaced fracture of first cervical vertebra, sequela: Secondary | ICD-10-CM | POA: Diagnosis not present

## 2017-12-21 DIAGNOSIS — R261 Paralytic gait: Secondary | ICD-10-CM | POA: Diagnosis not present

## 2017-12-24 DIAGNOSIS — G309 Alzheimer's disease, unspecified: Secondary | ICD-10-CM | POA: Diagnosis not present

## 2017-12-24 DIAGNOSIS — R261 Paralytic gait: Secondary | ICD-10-CM | POA: Diagnosis not present

## 2017-12-24 DIAGNOSIS — R4701 Aphasia: Secondary | ICD-10-CM | POA: Diagnosis not present

## 2017-12-24 DIAGNOSIS — S12000S Unspecified displaced fracture of first cervical vertebra, sequela: Secondary | ICD-10-CM | POA: Diagnosis not present

## 2017-12-24 DIAGNOSIS — R41841 Cognitive communication deficit: Secondary | ICD-10-CM | POA: Diagnosis not present

## 2017-12-24 DIAGNOSIS — R1312 Dysphagia, oropharyngeal phase: Secondary | ICD-10-CM | POA: Diagnosis not present

## 2017-12-25 DIAGNOSIS — R41841 Cognitive communication deficit: Secondary | ICD-10-CM | POA: Diagnosis not present

## 2017-12-25 DIAGNOSIS — R261 Paralytic gait: Secondary | ICD-10-CM | POA: Diagnosis not present

## 2017-12-25 DIAGNOSIS — R1312 Dysphagia, oropharyngeal phase: Secondary | ICD-10-CM | POA: Diagnosis not present

## 2017-12-25 DIAGNOSIS — S12000S Unspecified displaced fracture of first cervical vertebra, sequela: Secondary | ICD-10-CM | POA: Diagnosis not present

## 2017-12-25 DIAGNOSIS — R4701 Aphasia: Secondary | ICD-10-CM | POA: Diagnosis not present

## 2017-12-25 DIAGNOSIS — G309 Alzheimer's disease, unspecified: Secondary | ICD-10-CM | POA: Diagnosis not present

## 2017-12-26 DIAGNOSIS — R1312 Dysphagia, oropharyngeal phase: Secondary | ICD-10-CM | POA: Diagnosis not present

## 2017-12-26 DIAGNOSIS — R261 Paralytic gait: Secondary | ICD-10-CM | POA: Diagnosis not present

## 2017-12-26 DIAGNOSIS — R4701 Aphasia: Secondary | ICD-10-CM | POA: Diagnosis not present

## 2017-12-26 DIAGNOSIS — R41841 Cognitive communication deficit: Secondary | ICD-10-CM | POA: Diagnosis not present

## 2017-12-26 DIAGNOSIS — G309 Alzheimer's disease, unspecified: Secondary | ICD-10-CM | POA: Diagnosis not present

## 2017-12-26 DIAGNOSIS — S12000S Unspecified displaced fracture of first cervical vertebra, sequela: Secondary | ICD-10-CM | POA: Diagnosis not present

## 2017-12-27 DIAGNOSIS — R1312 Dysphagia, oropharyngeal phase: Secondary | ICD-10-CM | POA: Diagnosis not present

## 2017-12-27 DIAGNOSIS — S12000S Unspecified displaced fracture of first cervical vertebra, sequela: Secondary | ICD-10-CM | POA: Diagnosis not present

## 2017-12-27 DIAGNOSIS — R41841 Cognitive communication deficit: Secondary | ICD-10-CM | POA: Diagnosis not present

## 2017-12-27 DIAGNOSIS — R4701 Aphasia: Secondary | ICD-10-CM | POA: Diagnosis not present

## 2017-12-27 DIAGNOSIS — G309 Alzheimer's disease, unspecified: Secondary | ICD-10-CM | POA: Diagnosis not present

## 2017-12-27 DIAGNOSIS — R261 Paralytic gait: Secondary | ICD-10-CM | POA: Diagnosis not present

## 2017-12-28 DIAGNOSIS — S12000S Unspecified displaced fracture of first cervical vertebra, sequela: Secondary | ICD-10-CM | POA: Diagnosis not present

## 2017-12-28 DIAGNOSIS — R261 Paralytic gait: Secondary | ICD-10-CM | POA: Diagnosis not present

## 2017-12-28 DIAGNOSIS — R4701 Aphasia: Secondary | ICD-10-CM | POA: Diagnosis not present

## 2017-12-28 DIAGNOSIS — G309 Alzheimer's disease, unspecified: Secondary | ICD-10-CM | POA: Diagnosis not present

## 2017-12-28 DIAGNOSIS — R41841 Cognitive communication deficit: Secondary | ICD-10-CM | POA: Diagnosis not present

## 2017-12-28 DIAGNOSIS — R1312 Dysphagia, oropharyngeal phase: Secondary | ICD-10-CM | POA: Diagnosis not present

## 2017-12-31 DIAGNOSIS — R1312 Dysphagia, oropharyngeal phase: Secondary | ICD-10-CM | POA: Diagnosis not present

## 2017-12-31 DIAGNOSIS — R4701 Aphasia: Secondary | ICD-10-CM | POA: Diagnosis not present

## 2017-12-31 DIAGNOSIS — G309 Alzheimer's disease, unspecified: Secondary | ICD-10-CM | POA: Diagnosis not present

## 2017-12-31 DIAGNOSIS — S12000S Unspecified displaced fracture of first cervical vertebra, sequela: Secondary | ICD-10-CM | POA: Diagnosis not present

## 2017-12-31 DIAGNOSIS — R261 Paralytic gait: Secondary | ICD-10-CM | POA: Diagnosis not present

## 2017-12-31 DIAGNOSIS — R41841 Cognitive communication deficit: Secondary | ICD-10-CM | POA: Diagnosis not present

## 2018-01-01 DIAGNOSIS — R41841 Cognitive communication deficit: Secondary | ICD-10-CM | POA: Diagnosis not present

## 2018-01-01 DIAGNOSIS — R1312 Dysphagia, oropharyngeal phase: Secondary | ICD-10-CM | POA: Diagnosis not present

## 2018-01-01 DIAGNOSIS — R4701 Aphasia: Secondary | ICD-10-CM | POA: Diagnosis not present

## 2018-01-01 DIAGNOSIS — S12000S Unspecified displaced fracture of first cervical vertebra, sequela: Secondary | ICD-10-CM | POA: Diagnosis not present

## 2018-01-01 DIAGNOSIS — R261 Paralytic gait: Secondary | ICD-10-CM | POA: Diagnosis not present

## 2018-01-01 DIAGNOSIS — G309 Alzheimer's disease, unspecified: Secondary | ICD-10-CM | POA: Diagnosis not present

## 2018-01-02 DIAGNOSIS — R1312 Dysphagia, oropharyngeal phase: Secondary | ICD-10-CM | POA: Diagnosis not present

## 2018-01-02 DIAGNOSIS — S12000S Unspecified displaced fracture of first cervical vertebra, sequela: Secondary | ICD-10-CM | POA: Diagnosis not present

## 2018-01-02 DIAGNOSIS — R4701 Aphasia: Secondary | ICD-10-CM | POA: Diagnosis not present

## 2018-01-02 DIAGNOSIS — G309 Alzheimer's disease, unspecified: Secondary | ICD-10-CM | POA: Diagnosis not present

## 2018-01-02 DIAGNOSIS — R41841 Cognitive communication deficit: Secondary | ICD-10-CM | POA: Diagnosis not present

## 2018-01-02 DIAGNOSIS — R261 Paralytic gait: Secondary | ICD-10-CM | POA: Diagnosis not present

## 2018-01-03 DIAGNOSIS — R4701 Aphasia: Secondary | ICD-10-CM | POA: Diagnosis not present

## 2018-01-03 DIAGNOSIS — R41841 Cognitive communication deficit: Secondary | ICD-10-CM | POA: Diagnosis not present

## 2018-01-03 DIAGNOSIS — R1312 Dysphagia, oropharyngeal phase: Secondary | ICD-10-CM | POA: Diagnosis not present

## 2018-01-03 DIAGNOSIS — G309 Alzheimer's disease, unspecified: Secondary | ICD-10-CM | POA: Diagnosis not present

## 2018-01-03 DIAGNOSIS — S12000S Unspecified displaced fracture of first cervical vertebra, sequela: Secondary | ICD-10-CM | POA: Diagnosis not present

## 2018-01-03 DIAGNOSIS — R261 Paralytic gait: Secondary | ICD-10-CM | POA: Diagnosis not present

## 2018-01-04 DIAGNOSIS — R41841 Cognitive communication deficit: Secondary | ICD-10-CM | POA: Diagnosis not present

## 2018-01-04 DIAGNOSIS — R261 Paralytic gait: Secondary | ICD-10-CM | POA: Diagnosis not present

## 2018-01-04 DIAGNOSIS — R4701 Aphasia: Secondary | ICD-10-CM | POA: Diagnosis not present

## 2018-01-04 DIAGNOSIS — G309 Alzheimer's disease, unspecified: Secondary | ICD-10-CM | POA: Diagnosis not present

## 2018-01-04 DIAGNOSIS — S12000S Unspecified displaced fracture of first cervical vertebra, sequela: Secondary | ICD-10-CM | POA: Diagnosis not present

## 2018-01-04 DIAGNOSIS — R1312 Dysphagia, oropharyngeal phase: Secondary | ICD-10-CM | POA: Diagnosis not present

## 2018-01-07 DIAGNOSIS — S12000S Unspecified displaced fracture of first cervical vertebra, sequela: Secondary | ICD-10-CM | POA: Diagnosis not present

## 2018-01-07 DIAGNOSIS — R261 Paralytic gait: Secondary | ICD-10-CM | POA: Diagnosis not present

## 2018-01-07 DIAGNOSIS — R4701 Aphasia: Secondary | ICD-10-CM | POA: Diagnosis not present

## 2018-01-07 DIAGNOSIS — R1312 Dysphagia, oropharyngeal phase: Secondary | ICD-10-CM | POA: Diagnosis not present

## 2018-01-07 DIAGNOSIS — R41841 Cognitive communication deficit: Secondary | ICD-10-CM | POA: Diagnosis not present

## 2018-01-07 DIAGNOSIS — G309 Alzheimer's disease, unspecified: Secondary | ICD-10-CM | POA: Diagnosis not present

## 2018-01-08 DIAGNOSIS — R261 Paralytic gait: Secondary | ICD-10-CM | POA: Diagnosis not present

## 2018-01-08 DIAGNOSIS — S12000S Unspecified displaced fracture of first cervical vertebra, sequela: Secondary | ICD-10-CM | POA: Diagnosis not present

## 2018-01-08 DIAGNOSIS — R41841 Cognitive communication deficit: Secondary | ICD-10-CM | POA: Diagnosis not present

## 2018-01-08 DIAGNOSIS — R4701 Aphasia: Secondary | ICD-10-CM | POA: Diagnosis not present

## 2018-01-08 DIAGNOSIS — R1312 Dysphagia, oropharyngeal phase: Secondary | ICD-10-CM | POA: Diagnosis not present

## 2018-01-08 DIAGNOSIS — G309 Alzheimer's disease, unspecified: Secondary | ICD-10-CM | POA: Diagnosis not present

## 2018-01-09 DIAGNOSIS — G309 Alzheimer's disease, unspecified: Secondary | ICD-10-CM | POA: Diagnosis not present

## 2018-01-09 DIAGNOSIS — R1312 Dysphagia, oropharyngeal phase: Secondary | ICD-10-CM | POA: Diagnosis not present

## 2018-01-09 DIAGNOSIS — R41841 Cognitive communication deficit: Secondary | ICD-10-CM | POA: Diagnosis not present

## 2018-01-09 DIAGNOSIS — S12000S Unspecified displaced fracture of first cervical vertebra, sequela: Secondary | ICD-10-CM | POA: Diagnosis not present

## 2018-01-09 DIAGNOSIS — R261 Paralytic gait: Secondary | ICD-10-CM | POA: Diagnosis not present

## 2018-01-09 DIAGNOSIS — R4701 Aphasia: Secondary | ICD-10-CM | POA: Diagnosis not present

## 2018-01-10 DIAGNOSIS — R41841 Cognitive communication deficit: Secondary | ICD-10-CM | POA: Diagnosis not present

## 2018-01-10 DIAGNOSIS — R1312 Dysphagia, oropharyngeal phase: Secondary | ICD-10-CM | POA: Diagnosis not present

## 2018-01-10 DIAGNOSIS — R4701 Aphasia: Secondary | ICD-10-CM | POA: Diagnosis not present

## 2018-01-10 DIAGNOSIS — R261 Paralytic gait: Secondary | ICD-10-CM | POA: Diagnosis not present

## 2018-01-10 DIAGNOSIS — S12000S Unspecified displaced fracture of first cervical vertebra, sequela: Secondary | ICD-10-CM | POA: Diagnosis not present

## 2018-01-10 DIAGNOSIS — G309 Alzheimer's disease, unspecified: Secondary | ICD-10-CM | POA: Diagnosis not present

## 2018-01-11 DIAGNOSIS — S12000S Unspecified displaced fracture of first cervical vertebra, sequela: Secondary | ICD-10-CM | POA: Diagnosis not present

## 2018-01-11 DIAGNOSIS — R1312 Dysphagia, oropharyngeal phase: Secondary | ICD-10-CM | POA: Diagnosis not present

## 2018-01-11 DIAGNOSIS — R261 Paralytic gait: Secondary | ICD-10-CM | POA: Diagnosis not present

## 2018-01-11 DIAGNOSIS — R41841 Cognitive communication deficit: Secondary | ICD-10-CM | POA: Diagnosis not present

## 2018-01-11 DIAGNOSIS — R4701 Aphasia: Secondary | ICD-10-CM | POA: Diagnosis not present

## 2018-01-11 DIAGNOSIS — G309 Alzheimer's disease, unspecified: Secondary | ICD-10-CM | POA: Diagnosis not present

## 2018-01-14 DIAGNOSIS — R1312 Dysphagia, oropharyngeal phase: Secondary | ICD-10-CM | POA: Diagnosis not present

## 2018-01-14 DIAGNOSIS — R4701 Aphasia: Secondary | ICD-10-CM | POA: Diagnosis not present

## 2018-01-14 DIAGNOSIS — S12000S Unspecified displaced fracture of first cervical vertebra, sequela: Secondary | ICD-10-CM | POA: Diagnosis not present

## 2018-01-14 DIAGNOSIS — R41841 Cognitive communication deficit: Secondary | ICD-10-CM | POA: Diagnosis not present

## 2018-01-14 DIAGNOSIS — G309 Alzheimer's disease, unspecified: Secondary | ICD-10-CM | POA: Diagnosis not present

## 2018-01-14 DIAGNOSIS — R261 Paralytic gait: Secondary | ICD-10-CM | POA: Diagnosis not present

## 2018-01-15 DIAGNOSIS — S1201XA Stable burst fracture of first cervical vertebra, initial encounter for closed fracture: Secondary | ICD-10-CM | POA: Diagnosis not present

## 2018-01-15 DIAGNOSIS — R261 Paralytic gait: Secondary | ICD-10-CM | POA: Diagnosis not present

## 2018-01-15 DIAGNOSIS — R69 Illness, unspecified: Secondary | ICD-10-CM | POA: Diagnosis not present

## 2018-01-15 DIAGNOSIS — S12000S Unspecified displaced fracture of first cervical vertebra, sequela: Secondary | ICD-10-CM | POA: Diagnosis not present

## 2018-01-15 DIAGNOSIS — R1312 Dysphagia, oropharyngeal phase: Secondary | ICD-10-CM | POA: Diagnosis not present

## 2018-01-15 DIAGNOSIS — M542 Cervicalgia: Secondary | ICD-10-CM | POA: Diagnosis not present

## 2018-01-15 DIAGNOSIS — R41841 Cognitive communication deficit: Secondary | ICD-10-CM | POA: Diagnosis not present

## 2018-01-15 DIAGNOSIS — G309 Alzheimer's disease, unspecified: Secondary | ICD-10-CM | POA: Diagnosis not present

## 2018-01-15 DIAGNOSIS — R030 Elevated blood-pressure reading, without diagnosis of hypertension: Secondary | ICD-10-CM | POA: Diagnosis not present

## 2018-01-15 DIAGNOSIS — R4701 Aphasia: Secondary | ICD-10-CM | POA: Diagnosis not present

## 2018-01-16 DIAGNOSIS — G309 Alzheimer's disease, unspecified: Secondary | ICD-10-CM | POA: Diagnosis not present

## 2018-01-16 DIAGNOSIS — R1312 Dysphagia, oropharyngeal phase: Secondary | ICD-10-CM | POA: Diagnosis not present

## 2018-01-16 DIAGNOSIS — R4701 Aphasia: Secondary | ICD-10-CM | POA: Diagnosis not present

## 2018-01-16 DIAGNOSIS — R261 Paralytic gait: Secondary | ICD-10-CM | POA: Diagnosis not present

## 2018-01-16 DIAGNOSIS — S12000S Unspecified displaced fracture of first cervical vertebra, sequela: Secondary | ICD-10-CM | POA: Diagnosis not present

## 2018-01-16 DIAGNOSIS — R41841 Cognitive communication deficit: Secondary | ICD-10-CM | POA: Diagnosis not present

## 2018-01-17 DIAGNOSIS — S12000S Unspecified displaced fracture of first cervical vertebra, sequela: Secondary | ICD-10-CM | POA: Diagnosis not present

## 2018-01-17 DIAGNOSIS — R1312 Dysphagia, oropharyngeal phase: Secondary | ICD-10-CM | POA: Diagnosis not present

## 2018-01-17 DIAGNOSIS — R261 Paralytic gait: Secondary | ICD-10-CM | POA: Diagnosis not present

## 2018-01-17 DIAGNOSIS — G309 Alzheimer's disease, unspecified: Secondary | ICD-10-CM | POA: Diagnosis not present

## 2018-01-17 DIAGNOSIS — R41841 Cognitive communication deficit: Secondary | ICD-10-CM | POA: Diagnosis not present

## 2018-01-17 DIAGNOSIS — R4701 Aphasia: Secondary | ICD-10-CM | POA: Diagnosis not present

## 2018-01-18 DIAGNOSIS — R4701 Aphasia: Secondary | ICD-10-CM | POA: Diagnosis not present

## 2018-01-18 DIAGNOSIS — R1312 Dysphagia, oropharyngeal phase: Secondary | ICD-10-CM | POA: Diagnosis not present

## 2018-01-18 DIAGNOSIS — R261 Paralytic gait: Secondary | ICD-10-CM | POA: Diagnosis not present

## 2018-01-18 DIAGNOSIS — S12000S Unspecified displaced fracture of first cervical vertebra, sequela: Secondary | ICD-10-CM | POA: Diagnosis not present

## 2018-01-18 DIAGNOSIS — G309 Alzheimer's disease, unspecified: Secondary | ICD-10-CM | POA: Diagnosis not present

## 2018-01-18 DIAGNOSIS — R41841 Cognitive communication deficit: Secondary | ICD-10-CM | POA: Diagnosis not present

## 2018-02-14 DIAGNOSIS — G309 Alzheimer's disease, unspecified: Secondary | ICD-10-CM | POA: Diagnosis not present

## 2018-02-14 DIAGNOSIS — R69 Illness, unspecified: Secondary | ICD-10-CM | POA: Diagnosis not present

## 2018-02-14 DIAGNOSIS — J449 Chronic obstructive pulmonary disease, unspecified: Secondary | ICD-10-CM | POA: Diagnosis not present

## 2018-02-14 DIAGNOSIS — I10 Essential (primary) hypertension: Secondary | ICD-10-CM | POA: Diagnosis not present

## 2018-02-19 DIAGNOSIS — F0280 Dementia in other diseases classified elsewhere without behavioral disturbance: Secondary | ICD-10-CM | POA: Diagnosis not present

## 2018-02-19 DIAGNOSIS — R69 Illness, unspecified: Secondary | ICD-10-CM | POA: Diagnosis not present

## 2018-02-19 DIAGNOSIS — I2510 Atherosclerotic heart disease of native coronary artery without angina pectoris: Secondary | ICD-10-CM | POA: Diagnosis not present

## 2018-02-21 DIAGNOSIS — I10 Essential (primary) hypertension: Secondary | ICD-10-CM | POA: Diagnosis not present

## 2018-02-21 DIAGNOSIS — J449 Chronic obstructive pulmonary disease, unspecified: Secondary | ICD-10-CM | POA: Diagnosis not present

## 2018-02-21 DIAGNOSIS — Z5181 Encounter for therapeutic drug level monitoring: Secondary | ICD-10-CM | POA: Diagnosis not present

## 2018-03-11 DIAGNOSIS — M79674 Pain in right toe(s): Secondary | ICD-10-CM | POA: Diagnosis not present

## 2018-03-11 DIAGNOSIS — L851 Acquired keratosis [keratoderma] palmaris et plantaris: Secondary | ICD-10-CM | POA: Diagnosis not present

## 2018-03-11 DIAGNOSIS — B351 Tinea unguium: Secondary | ICD-10-CM | POA: Diagnosis not present

## 2018-03-11 DIAGNOSIS — M79675 Pain in left toe(s): Secondary | ICD-10-CM | POA: Diagnosis not present

## 2018-04-11 DIAGNOSIS — I10 Essential (primary) hypertension: Secondary | ICD-10-CM | POA: Diagnosis not present

## 2018-04-11 DIAGNOSIS — R69 Illness, unspecified: Secondary | ICD-10-CM | POA: Diagnosis not present

## 2018-04-11 DIAGNOSIS — J449 Chronic obstructive pulmonary disease, unspecified: Secondary | ICD-10-CM | POA: Diagnosis not present

## 2018-04-11 DIAGNOSIS — K219 Gastro-esophageal reflux disease without esophagitis: Secondary | ICD-10-CM | POA: Diagnosis not present

## 2018-05-01 DIAGNOSIS — M79674 Pain in right toe(s): Secondary | ICD-10-CM | POA: Diagnosis not present

## 2018-05-01 DIAGNOSIS — B351 Tinea unguium: Secondary | ICD-10-CM | POA: Diagnosis not present

## 2018-05-27 DIAGNOSIS — R269 Unspecified abnormalities of gait and mobility: Secondary | ICD-10-CM | POA: Diagnosis not present

## 2018-05-27 DIAGNOSIS — G309 Alzheimer's disease, unspecified: Secondary | ICD-10-CM | POA: Diagnosis not present

## 2018-05-28 DIAGNOSIS — G309 Alzheimer's disease, unspecified: Secondary | ICD-10-CM | POA: Diagnosis not present

## 2018-05-28 DIAGNOSIS — R269 Unspecified abnormalities of gait and mobility: Secondary | ICD-10-CM | POA: Diagnosis not present

## 2018-05-29 DIAGNOSIS — K219 Gastro-esophageal reflux disease without esophagitis: Secondary | ICD-10-CM | POA: Diagnosis not present

## 2018-05-29 DIAGNOSIS — I10 Essential (primary) hypertension: Secondary | ICD-10-CM | POA: Diagnosis not present

## 2018-05-29 DIAGNOSIS — G309 Alzheimer's disease, unspecified: Secondary | ICD-10-CM | POA: Diagnosis not present

## 2018-05-29 DIAGNOSIS — R69 Illness, unspecified: Secondary | ICD-10-CM | POA: Diagnosis not present

## 2018-05-29 DIAGNOSIS — I2510 Atherosclerotic heart disease of native coronary artery without angina pectoris: Secondary | ICD-10-CM | POA: Diagnosis not present

## 2018-05-29 DIAGNOSIS — R269 Unspecified abnormalities of gait and mobility: Secondary | ICD-10-CM | POA: Diagnosis not present

## 2018-05-30 DIAGNOSIS — G309 Alzheimer's disease, unspecified: Secondary | ICD-10-CM | POA: Diagnosis not present

## 2018-05-30 DIAGNOSIS — R269 Unspecified abnormalities of gait and mobility: Secondary | ICD-10-CM | POA: Diagnosis not present

## 2018-05-31 DIAGNOSIS — R269 Unspecified abnormalities of gait and mobility: Secondary | ICD-10-CM | POA: Diagnosis not present

## 2018-05-31 DIAGNOSIS — G309 Alzheimer's disease, unspecified: Secondary | ICD-10-CM | POA: Diagnosis not present

## 2018-06-03 DIAGNOSIS — G309 Alzheimer's disease, unspecified: Secondary | ICD-10-CM | POA: Diagnosis not present

## 2018-06-03 DIAGNOSIS — R269 Unspecified abnormalities of gait and mobility: Secondary | ICD-10-CM | POA: Diagnosis not present

## 2018-06-05 DIAGNOSIS — G309 Alzheimer's disease, unspecified: Secondary | ICD-10-CM | POA: Diagnosis not present

## 2018-06-05 DIAGNOSIS — R269 Unspecified abnormalities of gait and mobility: Secondary | ICD-10-CM | POA: Diagnosis not present

## 2018-06-06 DIAGNOSIS — R269 Unspecified abnormalities of gait and mobility: Secondary | ICD-10-CM | POA: Diagnosis not present

## 2018-06-06 DIAGNOSIS — G309 Alzheimer's disease, unspecified: Secondary | ICD-10-CM | POA: Diagnosis not present

## 2018-06-07 DIAGNOSIS — R269 Unspecified abnormalities of gait and mobility: Secondary | ICD-10-CM | POA: Diagnosis not present

## 2018-06-07 DIAGNOSIS — G309 Alzheimer's disease, unspecified: Secondary | ICD-10-CM | POA: Diagnosis not present

## 2018-06-10 DIAGNOSIS — G309 Alzheimer's disease, unspecified: Secondary | ICD-10-CM | POA: Diagnosis not present

## 2018-06-10 DIAGNOSIS — R269 Unspecified abnormalities of gait and mobility: Secondary | ICD-10-CM | POA: Diagnosis not present

## 2018-06-11 DIAGNOSIS — R269 Unspecified abnormalities of gait and mobility: Secondary | ICD-10-CM | POA: Diagnosis not present

## 2018-06-11 DIAGNOSIS — G309 Alzheimer's disease, unspecified: Secondary | ICD-10-CM | POA: Diagnosis not present

## 2018-06-12 DIAGNOSIS — R269 Unspecified abnormalities of gait and mobility: Secondary | ICD-10-CM | POA: Diagnosis not present

## 2018-06-12 DIAGNOSIS — G309 Alzheimer's disease, unspecified: Secondary | ICD-10-CM | POA: Diagnosis not present

## 2018-06-13 DIAGNOSIS — G309 Alzheimer's disease, unspecified: Secondary | ICD-10-CM | POA: Diagnosis not present

## 2018-06-13 DIAGNOSIS — R269 Unspecified abnormalities of gait and mobility: Secondary | ICD-10-CM | POA: Diagnosis not present

## 2018-06-14 DIAGNOSIS — R269 Unspecified abnormalities of gait and mobility: Secondary | ICD-10-CM | POA: Diagnosis not present

## 2018-06-14 DIAGNOSIS — G309 Alzheimer's disease, unspecified: Secondary | ICD-10-CM | POA: Diagnosis not present

## 2018-06-17 DIAGNOSIS — G309 Alzheimer's disease, unspecified: Secondary | ICD-10-CM | POA: Diagnosis not present

## 2018-06-17 DIAGNOSIS — R269 Unspecified abnormalities of gait and mobility: Secondary | ICD-10-CM | POA: Diagnosis not present

## 2018-06-18 DIAGNOSIS — G309 Alzheimer's disease, unspecified: Secondary | ICD-10-CM | POA: Diagnosis not present

## 2018-06-18 DIAGNOSIS — R269 Unspecified abnormalities of gait and mobility: Secondary | ICD-10-CM | POA: Diagnosis not present

## 2018-06-19 DIAGNOSIS — G309 Alzheimer's disease, unspecified: Secondary | ICD-10-CM | POA: Diagnosis not present

## 2018-06-19 DIAGNOSIS — R269 Unspecified abnormalities of gait and mobility: Secondary | ICD-10-CM | POA: Diagnosis not present

## 2018-06-20 DIAGNOSIS — R269 Unspecified abnormalities of gait and mobility: Secondary | ICD-10-CM | POA: Diagnosis not present

## 2018-06-20 DIAGNOSIS — G309 Alzheimer's disease, unspecified: Secondary | ICD-10-CM | POA: Diagnosis not present

## 2018-06-21 DIAGNOSIS — R269 Unspecified abnormalities of gait and mobility: Secondary | ICD-10-CM | POA: Diagnosis not present

## 2018-06-21 DIAGNOSIS — G309 Alzheimer's disease, unspecified: Secondary | ICD-10-CM | POA: Diagnosis not present

## 2018-06-24 DIAGNOSIS — G309 Alzheimer's disease, unspecified: Secondary | ICD-10-CM | POA: Diagnosis not present

## 2018-06-24 DIAGNOSIS — R269 Unspecified abnormalities of gait and mobility: Secondary | ICD-10-CM | POA: Diagnosis not present

## 2018-06-25 DIAGNOSIS — R269 Unspecified abnormalities of gait and mobility: Secondary | ICD-10-CM | POA: Diagnosis not present

## 2018-06-25 DIAGNOSIS — G309 Alzheimer's disease, unspecified: Secondary | ICD-10-CM | POA: Diagnosis not present

## 2018-07-11 DIAGNOSIS — R69 Illness, unspecified: Secondary | ICD-10-CM | POA: Diagnosis not present

## 2018-07-11 DIAGNOSIS — I10 Essential (primary) hypertension: Secondary | ICD-10-CM | POA: Diagnosis not present

## 2018-07-11 DIAGNOSIS — J449 Chronic obstructive pulmonary disease, unspecified: Secondary | ICD-10-CM | POA: Diagnosis not present

## 2018-07-11 DIAGNOSIS — G309 Alzheimer's disease, unspecified: Secondary | ICD-10-CM | POA: Diagnosis not present

## 2018-07-23 DIAGNOSIS — R69 Illness, unspecified: Secondary | ICD-10-CM | POA: Diagnosis not present

## 2018-07-24 DIAGNOSIS — B351 Tinea unguium: Secondary | ICD-10-CM | POA: Diagnosis not present

## 2018-07-24 DIAGNOSIS — M79674 Pain in right toe(s): Secondary | ICD-10-CM | POA: Diagnosis not present

## 2018-08-26 DIAGNOSIS — R69 Illness, unspecified: Secondary | ICD-10-CM | POA: Diagnosis not present

## 2018-09-04 DIAGNOSIS — J449 Chronic obstructive pulmonary disease, unspecified: Secondary | ICD-10-CM | POA: Diagnosis not present

## 2018-09-04 DIAGNOSIS — I10 Essential (primary) hypertension: Secondary | ICD-10-CM | POA: Diagnosis not present

## 2018-09-04 DIAGNOSIS — I2510 Atherosclerotic heart disease of native coronary artery without angina pectoris: Secondary | ICD-10-CM | POA: Diagnosis not present

## 2018-09-04 DIAGNOSIS — R69 Illness, unspecified: Secondary | ICD-10-CM | POA: Diagnosis not present

## 2018-09-12 DIAGNOSIS — R69 Illness, unspecified: Secondary | ICD-10-CM | POA: Diagnosis not present

## 2018-09-12 DIAGNOSIS — I2510 Atherosclerotic heart disease of native coronary artery without angina pectoris: Secondary | ICD-10-CM | POA: Diagnosis not present

## 2018-09-14 DIAGNOSIS — E039 Hypothyroidism, unspecified: Secondary | ICD-10-CM | POA: Diagnosis not present

## 2018-09-14 DIAGNOSIS — I10 Essential (primary) hypertension: Secondary | ICD-10-CM | POA: Diagnosis not present

## 2018-09-14 DIAGNOSIS — E559 Vitamin D deficiency, unspecified: Secondary | ICD-10-CM | POA: Diagnosis not present

## 2018-09-14 DIAGNOSIS — K219 Gastro-esophageal reflux disease without esophagitis: Secondary | ICD-10-CM | POA: Diagnosis not present

## 2018-09-14 DIAGNOSIS — D649 Anemia, unspecified: Secondary | ICD-10-CM | POA: Diagnosis not present

## 2018-09-14 DIAGNOSIS — J449 Chronic obstructive pulmonary disease, unspecified: Secondary | ICD-10-CM | POA: Diagnosis not present

## 2018-09-14 DIAGNOSIS — Z79899 Other long term (current) drug therapy: Secondary | ICD-10-CM | POA: Diagnosis not present

## 2018-09-14 DIAGNOSIS — Z5181 Encounter for therapeutic drug level monitoring: Secondary | ICD-10-CM | POA: Diagnosis not present

## 2018-09-16 DIAGNOSIS — K219 Gastro-esophageal reflux disease without esophagitis: Secondary | ICD-10-CM | POA: Diagnosis not present

## 2018-09-16 DIAGNOSIS — R69 Illness, unspecified: Secondary | ICD-10-CM | POA: Diagnosis not present

## 2018-09-16 DIAGNOSIS — I10 Essential (primary) hypertension: Secondary | ICD-10-CM | POA: Diagnosis not present

## 2018-09-16 DIAGNOSIS — J449 Chronic obstructive pulmonary disease, unspecified: Secondary | ICD-10-CM | POA: Diagnosis not present

## 2018-09-26 DIAGNOSIS — I10 Essential (primary) hypertension: Secondary | ICD-10-CM | POA: Diagnosis not present

## 2018-09-26 DIAGNOSIS — R69 Illness, unspecified: Secondary | ICD-10-CM | POA: Diagnosis not present

## 2018-10-02 DIAGNOSIS — S52591A Other fractures of lower end of right radius, initial encounter for closed fracture: Secondary | ICD-10-CM | POA: Diagnosis not present

## 2018-10-02 DIAGNOSIS — R4182 Altered mental status, unspecified: Secondary | ICD-10-CM | POA: Diagnosis not present

## 2018-10-02 DIAGNOSIS — S62101A Fracture of unspecified carpal bone, right wrist, initial encounter for closed fracture: Secondary | ICD-10-CM | POA: Diagnosis not present

## 2018-10-03 ENCOUNTER — Telehealth: Payer: Self-pay | Admitting: Orthopedic Surgery

## 2018-10-03 DIAGNOSIS — D649 Anemia, unspecified: Secondary | ICD-10-CM | POA: Diagnosis not present

## 2018-10-03 DIAGNOSIS — S62101D Fracture of unspecified carpal bone, right wrist, subsequent encounter for fracture with routine healing: Secondary | ICD-10-CM | POA: Diagnosis not present

## 2018-10-03 DIAGNOSIS — R319 Hematuria, unspecified: Secondary | ICD-10-CM | POA: Diagnosis not present

## 2018-10-03 DIAGNOSIS — R509 Fever, unspecified: Secondary | ICD-10-CM | POA: Diagnosis not present

## 2018-10-03 DIAGNOSIS — G309 Alzheimer's disease, unspecified: Secondary | ICD-10-CM | POA: Diagnosis not present

## 2018-10-03 DIAGNOSIS — R69 Illness, unspecified: Secondary | ICD-10-CM | POA: Diagnosis not present

## 2018-10-03 NOTE — Telephone Encounter (Addendum)
Jamie [name] with [facility] called they have a patient with Acute right distal radial metaphyseal fracture. Mobile x-rays were done and we have paper copy of it and report. Patient is in a splint. Do we see patient for this or does she need to be seen somewhere else? Notes and reports are in your box.  Please advise

## 2018-10-04 NOTE — Telephone Encounter (Signed)
Put in in my box

## 2018-10-07 DIAGNOSIS — M25541 Pain in joints of right hand: Secondary | ICD-10-CM | POA: Diagnosis not present

## 2018-10-07 DIAGNOSIS — S62101D Fracture of unspecified carpal bone, right wrist, subsequent encounter for fracture with routine healing: Secondary | ICD-10-CM | POA: Diagnosis not present

## 2018-10-07 DIAGNOSIS — G309 Alzheimer's disease, unspecified: Secondary | ICD-10-CM | POA: Diagnosis not present

## 2018-10-07 DIAGNOSIS — R69 Illness, unspecified: Secondary | ICD-10-CM | POA: Diagnosis not present

## 2018-10-08 DIAGNOSIS — S62101D Fracture of unspecified carpal bone, right wrist, subsequent encounter for fracture with routine healing: Secondary | ICD-10-CM | POA: Diagnosis not present

## 2018-10-08 DIAGNOSIS — S52501A Unspecified fracture of the lower end of right radius, initial encounter for closed fracture: Secondary | ICD-10-CM | POA: Diagnosis not present

## 2018-10-08 DIAGNOSIS — M25541 Pain in joints of right hand: Secondary | ICD-10-CM | POA: Diagnosis not present

## 2018-10-09 DIAGNOSIS — S62101D Fracture of unspecified carpal bone, right wrist, subsequent encounter for fracture with routine healing: Secondary | ICD-10-CM | POA: Diagnosis not present

## 2018-10-09 DIAGNOSIS — M25541 Pain in joints of right hand: Secondary | ICD-10-CM | POA: Diagnosis not present

## 2018-10-10 DIAGNOSIS — M25541 Pain in joints of right hand: Secondary | ICD-10-CM | POA: Diagnosis not present

## 2018-10-10 DIAGNOSIS — S62101D Fracture of unspecified carpal bone, right wrist, subsequent encounter for fracture with routine healing: Secondary | ICD-10-CM | POA: Diagnosis not present

## 2018-10-14 ENCOUNTER — Encounter: Payer: Self-pay | Admitting: Orthopedic Surgery

## 2018-10-14 ENCOUNTER — Ambulatory Visit: Payer: Medicare HMO | Admitting: Orthopedic Surgery

## 2018-10-14 ENCOUNTER — Ambulatory Visit (INDEPENDENT_AMBULATORY_CARE_PROVIDER_SITE_OTHER): Payer: Medicare HMO

## 2018-10-14 ENCOUNTER — Other Ambulatory Visit: Payer: Self-pay

## 2018-10-14 VITALS — BP 135/66 | HR 62 | Temp 97.8°F | Ht 63.0 in | Wt 139.0 lb

## 2018-10-14 DIAGNOSIS — S52531A Colles' fracture of right radius, initial encounter for closed fracture: Secondary | ICD-10-CM

## 2018-10-14 DIAGNOSIS — G309 Alzheimer's disease, unspecified: Secondary | ICD-10-CM | POA: Diagnosis not present

## 2018-10-14 DIAGNOSIS — R69 Illness, unspecified: Secondary | ICD-10-CM | POA: Diagnosis not present

## 2018-10-14 NOTE — Patient Instructions (Signed)
Cast or Splint Care, Adult  Casts and splints are supports that are worn to protect broken bones and other injuries. A cast or splint may hold a bone still and in the correct position while it heals. Casts and splints may also help to ease pain, swelling, and muscle spasms.  How to care for your cast    · Do not stick anything inside the cast to scratch your skin.  · Check the skin around the cast every day. Tell your doctor about any concerns.  · You may put lotion on dry skin around the edges of the cast. Do not put lotion on the skin under the cast.  · Keep the cast clean.  · If the cast is not waterproof:  ? Do not let it get wet.  ? Cover it with a watertight covering when you take a bath or a shower.  How to care for your splint    · Wear it as told by your doctor. Take it off only as told by your doctor.  · Loosen the splint if your fingers or toes tingle, get numb, or turn cold and blue.  · Keep the splint clean.  · If the splint is not waterproof:  ? Do not let it get wet.  ? Cover it with a watertight covering when you take a bath or a shower.  Follow these instructions at home:  Bathing  · Do not take baths or swim until your doctor says it is okay. Ask your doctor if you can take showers. You may only be allowed to take sponge baths for bathing.  · If your cast or splint is not waterproof, cover it with a watertight covering when you take a bath or shower.  Managing pain, stiffness, and swelling  · Move your fingers or toes often to avoid stiffness and to lessen swelling.  · Raise (elevate) the injured area above the level of your heart while sitting or lying down.  Safety  · Do not use the injured limb to support your body weight until your doctor says that it is okay.  · Use crutches or other assistive devices as told by your doctor.  General instructions  · Do not put pressure on any part of the cast or splint until it is fully hardened. This may take many hours.  · Return to your normal activities as  told by your doctor. Ask your doctor what activities are safe for you.  · Keep all follow-up visits as told by your doctor. This is important.  Contact a doctor if:  · Your cast or splint gets damaged.  · The skin around the cast gets red or raw.  · The skin under the cast is very itchy or painful.  · Your cast or splint feels very uncomfortable.  · Your cast or splint is too tight or too loose.  · Your cast becomes wet or it starts to have a soft spot or area.  · You get an object stuck under your cast.  Get help right away if:  · Your pain gets worse.  · The injured area tingles, gets numb, or turns blue and cold.  · The part of your body above or below the cast is swollen and it turns a different color (is discolored).  · You cannot feel or move your fingers or toes.  · There is fluid leaking through the cast.  · You have very bad pain or pressure under the cast.  ·   09/28/2010 Document Revised: 05/19/2016 Document Reviewed: 05/19/2016 Elsevier Interactive Patient Education  2019 [employer]  Wrist Fracture Treated With Immobilization  A wrist fracture is a break or crack in one of the bones of your wrist. Your wrist is made of eight small bones at the palm of your hand (carpal bones) and two long bones of the forearm (radius and ulna). A broken wrist is often treated by wearing a cast, splint, or sling (immobilization). This holds the broken pieces in place so they can heal. What are the causes?  A direct blow to the wrist.  Falling on an outstretched hand.  Trauma, such as a car accident or a fall. What increases the risk?  Doing contact sports or high-risk sports such as skiing, biking, and ice skating.  Taking steroid  medicines.  Smoking.  Being female.  Being Caucasian.  Drinking more than three drinks that contain alcohol per day.  Having a condition that weakens the bones (osteoporosis).  Being older.  Having had other fractures in the past. What are the signs or symptoms?  Pain.  Swelling.  Bruising.  Not being able to move the wrist normally.  The wrist hanging in an odd position or looking oddly shaped. How is this treated? Treatment involves wearing a cast, splint, or sling. Once the injury has healed enough, you may begin doing range-of-motion exercises. You may also be prescribed pain medicine. Follow these instructions at home: If you have a cast:  Do not stick anything inside the cast to scratch your skin.  Check the skin around the cast every day. Tell your doctor if you have any concerns.  You may put lotion on dry skin around the edges of the cast. Do not put lotion on the skin under the cast.  Keep the cast clean and dry. If you have a splint or sling:  Wear the splint or sling as told by your doctor. Remove it only as told by your doctor.  Loosen the splint or sling if your fingers: ? Tingle. ? Get numb. ? Turn cold and blue.  Keep the splint or sling clean and dry. Bathing  Do not take baths, swim, or use a hot tub until your doctor approves. Ask your doctor if you may take showers. You may only be allowed to take sponge baths.  If your cast, splint, or sling is not waterproof: ? Do not let it get wet. ? Cover it with a watertight covering when you take a bath or shower.  If you have a sling, remove it for bathing only if your doctor says this is okay. Managing pain, stiffness, and swelling   If told, put ice on the injured area. ? If you have a removable splint or sling, remove it as told by your doctor. ? Put ice in a plastic bag. ? Place a towel between your skin and the bag or between your cast and the bag. ? Leave the ice on for 20 minutes, 2-3  times a day.  Move your fingers often.  Raise (elevate) the injured area above the level of your heart while you are sitting or lying down. Driving  Do not drive or use heavy machinery while taking prescription pain medicine.  Ask your doctor when it is safe to drive if you have a cast, splint, or sling on your wrist. Activity  Return to your normal activities as told by your doctor. Ask your doctor what activities are safe for you.  Do not lift  with your injured wrist until your doctor says this is okay.  Do range-of-motion exercises only as told by your doctor. General instructions  Do not put pressure on any part of the cast or splint until it is fully hardened. This may take many hours.  Take over-the-counter and prescription medicines only as told by your doctor.  Do not use any products that contain nicotine or tobacco, such as cigarettes, e-cigarettes, and chewing tobacco. These can delay bone healing. If you need help quitting, ask your doctor.  If you were prescribed pain medicine, take steps to prevent or treat trouble pooping. Your doctor may suggest that you: ? Drink enough fluid to keep your pee (urine) pale yellow. ? Take over-the-counter or prescription medicines. ? Eat foods that are high in fiber. These include beans, whole grains, and fresh fruits and vegetables. ? Limit foods that are high in fat and sugar. These include fried or sweet foods.  Keep all follow-up visits as told by your doctor. This is important. Contact a doctor if:  Your cast, splint, or sling is damaged or loose.  You have any new pain, swelling, or bruising.  Your pain, swelling, and bruising do not get better.  You have a fever.  You have chills. Get help right away if:  Your skin or fingers on your injured arm turn blue or gray.  Your arm feels cold or gets numb.  You have very bad pain in your injured wrist. Summary  A wrist fracture is a break or crack in one of the bones  of your wrist.  A broken wrist is often treated by wearing a cast, splint, or sling.  You should check the skin around a cast every day.  Remove a splint or sling only as told by your doctor. This information is not intended to replace advice given to you by your health care provider. Make sure you discuss any questions you have with your health care provider. Document Released: 11/15/2007 Document Revised: 10/30/2017 Document Reviewed: 10/30/2017 Elsevier Interactive Patient Education  2019 [employer].

## 2018-10-14 NOTE — Addendum Note (Signed)
Addended by: Santo Held on: 10/14/2018 10:48 AM   Modules accepted: Orders

## 2018-10-14 NOTE — Progress Notes (Signed)
Patient ID: Jamie [name], female   DOB: 08/15/1928, 85 y.o.   MRN: [id number]  Chief Complaint  Patient presents with  . Wrist Injury    10/01/18 right wrist     HPI [name] is a 85 y.o. female.   85 year old female fell on April 21.  She is in a nursing home so we kept her out of the office.  Initial x-ray was only 1 view so we had to bring her in the office for the x-ray  She complains of mild discomfort in the right wrist some decreased range of motion and swelling mechanism of injury was a mechanical fall  Pain is mild and dull    Review of Systems Review of Systems  Skin: Negative for color change, pallor, rash and wound.       Right wrist  Neurological: Negative for numbness.     Past Medical History:  Diagnosis Date  . Adenomatous colon polyp 12/1993  . Allergy    lipitor  . Anxiety   . COPD (chronic obstructive pulmonary disease) ([place])   . Dementia (Jamie [name])   . Diverticulosis   . Dysphagia    to solids and pills .  On left side  . Esophageal cancer ([place]) 05/15/12   bx=, middle,Invasive squamous cell ca,  . Hot flashes   . Hyperlipidemia   . Hypertension   . Mitral valve insufficiency   . Osteoporosis     Past Surgical History:  Procedure Laterality Date  . ABDOMINAL HYSTERECTOMY  2683,4196   salpingo-opherctomy 1986  . ANTERIOR AND POSTERIOR VAGINAL REPAIR W/ SACROSPINOUS LIGAMENT SUSPENSION    . Biopsy of Esophagus  05/15/12   Invasive Squamous Cell Carcinoma  . colon polyps     hx adenomatous  . COLONOSCOPY  2008   neg  . EUS  06/03/2012   Procedure: UPPER ENDOSCOPIC ULTRASOUND (EUS) LINEAR;  Surgeon: Milus Banister, MD;  Location: [facility];  Service: Endoscopy;  Laterality: N/A;  +/- radial and liner   . EXPLORATORY LAPAROTOMY  1985  . FEMUR IM NAIL Right 09/14/2016   Procedure: INTRAMEDULLARY (IM) NAIL FEMORAL;  Surgeon: Marchia Bond, MD;  Location: [facility];  Service: Orthopedics;  Laterality: Right;  . orif left elbow  05/13/08   s/p fall, olecranon fracture  . TONSILLECTOMY    . TUBAL LIGATION        Physical Exam 1 Blood pressure 135/66, pulse 62, temperature 97.8 F (36.6 C), height 5\' 3"  (1.6 m), weight 139 lb (63 kg). Physical Exam 2 The patient is well developed well nourished and well groomed. 3 Orientation to person place and time is normal  4 Mood is pleasant.  5 Ambulatory status came in with a wheelchair today  6 Inspection of the right wrist reveals mild tenderness   mild swelling minimal deformity 7 Range of motion assessment: The range of motion is diminished primarily secondary to pain 8 Stability tests are deferred because of pain but the x-ray shows no subluxation of the joint 9 Strength assessment muscle tone is normal resistance testing is deferred because of pain and swelling  10 Nerve function intact median radial ulnar nerve 11 Vascular function radial pulse normal color normal capillary refill normal 12 Local lymphatic system negative  Opposite extremity left upper extremity wrist there is no alignment abnormality, no contracture, no subluxation, no atrophy and neurovascular exam is intact   MEDICAL DECISION SECTION  xrays ordered?  Right wrist x-ray  My independent reading of xrays:  X-ray report AP lateral oblique right wrist  Evaluate right wrist fracture  Slight shortening slight apex volar angulation with dorsal tilt  Impression right distal radius fracture with apex volar angulation with dorsal tilt slight shortening    Encounter Diagnosis  Name Primary?  . Closed Colles' fracture of right radius, initial encounter Yes     PLAN:   No orders of the defined types were placed in this encounter.  XRAYS 4 WEEKS  Out of plaster

## 2018-10-17 DIAGNOSIS — R69 Illness, unspecified: Secondary | ICD-10-CM | POA: Diagnosis not present

## 2018-10-17 DIAGNOSIS — G309 Alzheimer's disease, unspecified: Secondary | ICD-10-CM | POA: Diagnosis not present

## 2018-10-21 DIAGNOSIS — S62101D Fracture of unspecified carpal bone, right wrist, subsequent encounter for fracture with routine healing: Secondary | ICD-10-CM | POA: Diagnosis not present

## 2018-10-21 DIAGNOSIS — R69 Illness, unspecified: Secondary | ICD-10-CM | POA: Diagnosis not present

## 2018-10-21 DIAGNOSIS — G309 Alzheimer's disease, unspecified: Secondary | ICD-10-CM | POA: Diagnosis not present

## 2018-10-22 DIAGNOSIS — R569 Unspecified convulsions: Secondary | ICD-10-CM | POA: Diagnosis not present

## 2018-10-22 DIAGNOSIS — Z5181 Encounter for therapeutic drug level monitoring: Secondary | ICD-10-CM | POA: Diagnosis not present

## 2018-10-31 DIAGNOSIS — I10 Essential (primary) hypertension: Secondary | ICD-10-CM | POA: Diagnosis not present

## 2018-10-31 DIAGNOSIS — R69 Illness, unspecified: Secondary | ICD-10-CM | POA: Diagnosis not present

## 2018-10-31 DIAGNOSIS — K219 Gastro-esophageal reflux disease without esophagitis: Secondary | ICD-10-CM | POA: Diagnosis not present

## 2018-10-31 DIAGNOSIS — J449 Chronic obstructive pulmonary disease, unspecified: Secondary | ICD-10-CM | POA: Diagnosis not present

## 2018-11-11 ENCOUNTER — Encounter: Payer: Self-pay | Admitting: Orthopedic Surgery

## 2018-11-11 ENCOUNTER — Other Ambulatory Visit: Payer: Self-pay

## 2018-11-11 ENCOUNTER — Ambulatory Visit (INDEPENDENT_AMBULATORY_CARE_PROVIDER_SITE_OTHER): Payer: Medicare HMO | Admitting: Orthopedic Surgery

## 2018-11-11 ENCOUNTER — Ambulatory Visit (INDEPENDENT_AMBULATORY_CARE_PROVIDER_SITE_OTHER): Payer: Medicare HMO

## 2018-11-11 VITALS — Temp 97.2°F

## 2018-11-11 DIAGNOSIS — S62101D Fracture of unspecified carpal bone, right wrist, subsequent encounter for fracture with routine healing: Secondary | ICD-10-CM

## 2018-11-11 NOTE — Patient Instructions (Signed)
Cast x 4 weeks  Fu 4 weeks xr oop

## 2018-11-11 NOTE — Progress Notes (Signed)
Patient ID: Jamie [name], female   DOB: [birth year], 85 y.o.   MRN: [id number]  FRACTURE CARE   Chief Complaint  Patient presents with  . Wrist Injury    right wrist fracutre follow up 10/01/18    Encounter Diagnosis  Name Primary?  . Closed fracture of right wrist with routine healing, subsequent encounter Yes    CURRENT TREATMENT : Right arm cast POST INJURY DAY: 41 GLOBAL PERIOD DAY 28/90  Physical Exam Musculoskeletal:     Right wrist: She exhibits decreased range of motion, tenderness, bony tenderness and deformity. She exhibits no swelling, no crepitus and no laceration.       Arms:      X-ray today shows no displacement of the fracture other than noted on x-ray dated May 4  Patient will not keep brace on so we will put her back in a cast for 4 weeks  Follow-up 4 weeks

## 2018-12-05 DIAGNOSIS — S62101A Fracture of unspecified carpal bone, right wrist, initial encounter for closed fracture: Secondary | ICD-10-CM | POA: Insufficient documentation

## 2018-12-09 ENCOUNTER — Other Ambulatory Visit: Payer: Self-pay

## 2018-12-09 ENCOUNTER — Ambulatory Visit (INDEPENDENT_AMBULATORY_CARE_PROVIDER_SITE_OTHER): Payer: Medicare HMO

## 2018-12-09 ENCOUNTER — Encounter: Payer: Self-pay | Admitting: Orthopedic Surgery

## 2018-12-09 ENCOUNTER — Ambulatory Visit (INDEPENDENT_AMBULATORY_CARE_PROVIDER_SITE_OTHER): Payer: Medicare HMO | Admitting: Orthopedic Surgery

## 2018-12-09 VITALS — BP 139/61 | HR 59 | Temp 97.2°F | Ht 63.0 in | Wt 139.0 lb

## 2018-12-09 DIAGNOSIS — S62101D Fracture of unspecified carpal bone, right wrist, subsequent encounter for fracture with routine healing: Secondary | ICD-10-CM

## 2018-12-09 NOTE — Progress Notes (Signed)
Patient ID: Jamie [name], female   DOB: December 26, 1928, 85 y.o.   MRN: [id number]  FRACTURE CARE   Chief Complaint  Patient presents with  . Wrist Injury    right 10/01/18     Encounter Diagnosis  Name Primary?  . Closed fracture of right wrist with routine healing, subsequent encounter 10/01/18 Yes    CURRENT TREATMENT : Cast  POST INJURY DAY: 69  GLOBAL PERIOD DAY 56/90  Xrays: The fracture x-ray report  Ortho care [place] imaging multiple views of the right wrist status post fracture  X-ray shows reasonable alignment with slight shortening minimal angulation  Impression healed fracture minimal angulation and shortening right wrist  Clinical findings:

## 2019-01-01 DIAGNOSIS — Z20828 Contact with and (suspected) exposure to other viral communicable diseases: Secondary | ICD-10-CM | POA: Diagnosis not present

## 2019-01-13 DIAGNOSIS — F419 Anxiety disorder, unspecified: Secondary | ICD-10-CM | POA: Diagnosis not present

## 2019-01-13 DIAGNOSIS — R69 Illness, unspecified: Secondary | ICD-10-CM | POA: Diagnosis not present

## 2019-01-13 DIAGNOSIS — F0281 Dementia in other diseases classified elsewhere with behavioral disturbance: Secondary | ICD-10-CM | POA: Diagnosis not present

## 2019-01-15 DIAGNOSIS — I2510 Atherosclerotic heart disease of native coronary artery without angina pectoris: Secondary | ICD-10-CM | POA: Diagnosis not present

## 2019-01-15 DIAGNOSIS — R52 Pain, unspecified: Secondary | ICD-10-CM | POA: Diagnosis not present

## 2019-01-15 DIAGNOSIS — R69 Illness, unspecified: Secondary | ICD-10-CM | POA: Diagnosis not present

## 2019-01-21 DIAGNOSIS — N390 Urinary tract infection, site not specified: Secondary | ICD-10-CM | POA: Diagnosis not present

## 2019-01-21 DIAGNOSIS — Z79899 Other long term (current) drug therapy: Secondary | ICD-10-CM | POA: Diagnosis not present

## 2019-01-21 DIAGNOSIS — R319 Hematuria, unspecified: Secondary | ICD-10-CM | POA: Diagnosis not present

## 2019-01-24 DIAGNOSIS — Z20828 Contact with and (suspected) exposure to other viral communicable diseases: Secondary | ICD-10-CM | POA: Diagnosis not present

## 2019-01-27 DIAGNOSIS — N390 Urinary tract infection, site not specified: Secondary | ICD-10-CM | POA: Diagnosis not present

## 2019-01-27 DIAGNOSIS — G309 Alzheimer's disease, unspecified: Secondary | ICD-10-CM | POA: Diagnosis not present

## 2019-01-27 DIAGNOSIS — F0281 Dementia in other diseases classified elsewhere with behavioral disturbance: Secondary | ICD-10-CM | POA: Diagnosis not present

## 2019-01-27 DIAGNOSIS — R69 Illness, unspecified: Secondary | ICD-10-CM | POA: Diagnosis not present

## 2019-02-12 DIAGNOSIS — R2681 Unsteadiness on feet: Secondary | ICD-10-CM | POA: Diagnosis not present

## 2019-02-12 DIAGNOSIS — R262 Difficulty in walking, not elsewhere classified: Secondary | ICD-10-CM | POA: Diagnosis not present

## 2019-02-12 DIAGNOSIS — G309 Alzheimer's disease, unspecified: Secondary | ICD-10-CM | POA: Diagnosis not present

## 2019-02-12 DIAGNOSIS — S62101D Fracture of unspecified carpal bone, right wrist, subsequent encounter for fracture with routine healing: Secondary | ICD-10-CM | POA: Diagnosis not present

## 2019-02-12 DIAGNOSIS — M6281 Muscle weakness (generalized): Secondary | ICD-10-CM | POA: Diagnosis not present

## 2019-02-13 DIAGNOSIS — R2681 Unsteadiness on feet: Secondary | ICD-10-CM | POA: Diagnosis not present

## 2019-02-13 DIAGNOSIS — G309 Alzheimer's disease, unspecified: Secondary | ICD-10-CM | POA: Diagnosis not present

## 2019-02-13 DIAGNOSIS — R262 Difficulty in walking, not elsewhere classified: Secondary | ICD-10-CM | POA: Diagnosis not present

## 2019-02-13 DIAGNOSIS — S62101D Fracture of unspecified carpal bone, right wrist, subsequent encounter for fracture with routine healing: Secondary | ICD-10-CM | POA: Diagnosis not present

## 2019-02-13 DIAGNOSIS — M6281 Muscle weakness (generalized): Secondary | ICD-10-CM | POA: Diagnosis not present

## 2019-02-17 DIAGNOSIS — R262 Difficulty in walking, not elsewhere classified: Secondary | ICD-10-CM | POA: Diagnosis not present

## 2019-02-17 DIAGNOSIS — M6281 Muscle weakness (generalized): Secondary | ICD-10-CM | POA: Diagnosis not present

## 2019-02-17 DIAGNOSIS — R2681 Unsteadiness on feet: Secondary | ICD-10-CM | POA: Diagnosis not present

## 2019-02-17 DIAGNOSIS — S62101D Fracture of unspecified carpal bone, right wrist, subsequent encounter for fracture with routine healing: Secondary | ICD-10-CM | POA: Diagnosis not present

## 2019-02-17 DIAGNOSIS — G309 Alzheimer's disease, unspecified: Secondary | ICD-10-CM | POA: Diagnosis not present

## 2019-02-18 DIAGNOSIS — M6281 Muscle weakness (generalized): Secondary | ICD-10-CM | POA: Diagnosis not present

## 2019-02-18 DIAGNOSIS — R262 Difficulty in walking, not elsewhere classified: Secondary | ICD-10-CM | POA: Diagnosis not present

## 2019-02-18 DIAGNOSIS — G309 Alzheimer's disease, unspecified: Secondary | ICD-10-CM | POA: Diagnosis not present

## 2019-02-18 DIAGNOSIS — S62101D Fracture of unspecified carpal bone, right wrist, subsequent encounter for fracture with routine healing: Secondary | ICD-10-CM | POA: Diagnosis not present

## 2019-02-18 DIAGNOSIS — R2681 Unsteadiness on feet: Secondary | ICD-10-CM | POA: Diagnosis not present

## 2019-02-19 DIAGNOSIS — R262 Difficulty in walking, not elsewhere classified: Secondary | ICD-10-CM | POA: Diagnosis not present

## 2019-02-19 DIAGNOSIS — S62101D Fracture of unspecified carpal bone, right wrist, subsequent encounter for fracture with routine healing: Secondary | ICD-10-CM | POA: Diagnosis not present

## 2019-02-19 DIAGNOSIS — R2681 Unsteadiness on feet: Secondary | ICD-10-CM | POA: Diagnosis not present

## 2019-02-19 DIAGNOSIS — M6281 Muscle weakness (generalized): Secondary | ICD-10-CM | POA: Diagnosis not present

## 2019-02-19 DIAGNOSIS — G309 Alzheimer's disease, unspecified: Secondary | ICD-10-CM | POA: Diagnosis not present

## 2019-02-20 DIAGNOSIS — S62101D Fracture of unspecified carpal bone, right wrist, subsequent encounter for fracture with routine healing: Secondary | ICD-10-CM | POA: Diagnosis not present

## 2019-02-20 DIAGNOSIS — J449 Chronic obstructive pulmonary disease, unspecified: Secondary | ICD-10-CM | POA: Diagnosis not present

## 2019-02-20 DIAGNOSIS — K219 Gastro-esophageal reflux disease without esophagitis: Secondary | ICD-10-CM | POA: Diagnosis not present

## 2019-02-20 DIAGNOSIS — R69 Illness, unspecified: Secondary | ICD-10-CM | POA: Diagnosis not present

## 2019-02-20 DIAGNOSIS — G309 Alzheimer's disease, unspecified: Secondary | ICD-10-CM | POA: Diagnosis not present

## 2019-02-20 DIAGNOSIS — R262 Difficulty in walking, not elsewhere classified: Secondary | ICD-10-CM | POA: Diagnosis not present

## 2019-02-20 DIAGNOSIS — R2681 Unsteadiness on feet: Secondary | ICD-10-CM | POA: Diagnosis not present

## 2019-02-20 DIAGNOSIS — I10 Essential (primary) hypertension: Secondary | ICD-10-CM | POA: Diagnosis not present

## 2019-02-20 DIAGNOSIS — M6281 Muscle weakness (generalized): Secondary | ICD-10-CM | POA: Diagnosis not present

## 2019-02-21 DIAGNOSIS — G309 Alzheimer's disease, unspecified: Secondary | ICD-10-CM | POA: Diagnosis not present

## 2019-02-21 DIAGNOSIS — M6281 Muscle weakness (generalized): Secondary | ICD-10-CM | POA: Diagnosis not present

## 2019-02-21 DIAGNOSIS — S62101D Fracture of unspecified carpal bone, right wrist, subsequent encounter for fracture with routine healing: Secondary | ICD-10-CM | POA: Diagnosis not present

## 2019-02-21 DIAGNOSIS — R262 Difficulty in walking, not elsewhere classified: Secondary | ICD-10-CM | POA: Diagnosis not present

## 2019-02-21 DIAGNOSIS — R2681 Unsteadiness on feet: Secondary | ICD-10-CM | POA: Diagnosis not present

## 2019-02-24 DIAGNOSIS — R262 Difficulty in walking, not elsewhere classified: Secondary | ICD-10-CM | POA: Diagnosis not present

## 2019-02-24 DIAGNOSIS — Z20828 Contact with and (suspected) exposure to other viral communicable diseases: Secondary | ICD-10-CM | POA: Diagnosis not present

## 2019-02-24 DIAGNOSIS — S62101D Fracture of unspecified carpal bone, right wrist, subsequent encounter for fracture with routine healing: Secondary | ICD-10-CM | POA: Diagnosis not present

## 2019-02-24 DIAGNOSIS — G309 Alzheimer's disease, unspecified: Secondary | ICD-10-CM | POA: Diagnosis not present

## 2019-02-24 DIAGNOSIS — M6281 Muscle weakness (generalized): Secondary | ICD-10-CM | POA: Diagnosis not present

## 2019-02-24 DIAGNOSIS — R2681 Unsteadiness on feet: Secondary | ICD-10-CM | POA: Diagnosis not present

## 2019-02-25 DIAGNOSIS — R2681 Unsteadiness on feet: Secondary | ICD-10-CM | POA: Diagnosis not present

## 2019-02-25 DIAGNOSIS — S62101D Fracture of unspecified carpal bone, right wrist, subsequent encounter for fracture with routine healing: Secondary | ICD-10-CM | POA: Diagnosis not present

## 2019-02-25 DIAGNOSIS — G309 Alzheimer's disease, unspecified: Secondary | ICD-10-CM | POA: Diagnosis not present

## 2019-02-25 DIAGNOSIS — M6281 Muscle weakness (generalized): Secondary | ICD-10-CM | POA: Diagnosis not present

## 2019-02-25 DIAGNOSIS — R262 Difficulty in walking, not elsewhere classified: Secondary | ICD-10-CM | POA: Diagnosis not present

## 2019-02-26 DIAGNOSIS — S62101D Fracture of unspecified carpal bone, right wrist, subsequent encounter for fracture with routine healing: Secondary | ICD-10-CM | POA: Diagnosis not present

## 2019-02-26 DIAGNOSIS — M6281 Muscle weakness (generalized): Secondary | ICD-10-CM | POA: Diagnosis not present

## 2019-02-26 DIAGNOSIS — G309 Alzheimer's disease, unspecified: Secondary | ICD-10-CM | POA: Diagnosis not present

## 2019-02-26 DIAGNOSIS — R2681 Unsteadiness on feet: Secondary | ICD-10-CM | POA: Diagnosis not present

## 2019-02-26 DIAGNOSIS — R262 Difficulty in walking, not elsewhere classified: Secondary | ICD-10-CM | POA: Diagnosis not present

## 2019-02-27 DIAGNOSIS — S62101D Fracture of unspecified carpal bone, right wrist, subsequent encounter for fracture with routine healing: Secondary | ICD-10-CM | POA: Diagnosis not present

## 2019-02-27 DIAGNOSIS — R2681 Unsteadiness on feet: Secondary | ICD-10-CM | POA: Diagnosis not present

## 2019-02-27 DIAGNOSIS — R262 Difficulty in walking, not elsewhere classified: Secondary | ICD-10-CM | POA: Diagnosis not present

## 2019-02-27 DIAGNOSIS — M6281 Muscle weakness (generalized): Secondary | ICD-10-CM | POA: Diagnosis not present

## 2019-02-27 DIAGNOSIS — G309 Alzheimer's disease, unspecified: Secondary | ICD-10-CM | POA: Diagnosis not present

## 2019-02-28 DIAGNOSIS — S62101D Fracture of unspecified carpal bone, right wrist, subsequent encounter for fracture with routine healing: Secondary | ICD-10-CM | POA: Diagnosis not present

## 2019-02-28 DIAGNOSIS — G309 Alzheimer's disease, unspecified: Secondary | ICD-10-CM | POA: Diagnosis not present

## 2019-02-28 DIAGNOSIS — M6281 Muscle weakness (generalized): Secondary | ICD-10-CM | POA: Diagnosis not present

## 2019-02-28 DIAGNOSIS — R2681 Unsteadiness on feet: Secondary | ICD-10-CM | POA: Diagnosis not present

## 2019-02-28 DIAGNOSIS — R262 Difficulty in walking, not elsewhere classified: Secondary | ICD-10-CM | POA: Diagnosis not present

## 2019-03-03 DIAGNOSIS — R2681 Unsteadiness on feet: Secondary | ICD-10-CM | POA: Diagnosis not present

## 2019-03-03 DIAGNOSIS — S62101D Fracture of unspecified carpal bone, right wrist, subsequent encounter for fracture with routine healing: Secondary | ICD-10-CM | POA: Diagnosis not present

## 2019-03-03 DIAGNOSIS — G309 Alzheimer's disease, unspecified: Secondary | ICD-10-CM | POA: Diagnosis not present

## 2019-03-03 DIAGNOSIS — M6281 Muscle weakness (generalized): Secondary | ICD-10-CM | POA: Diagnosis not present

## 2019-03-03 DIAGNOSIS — R262 Difficulty in walking, not elsewhere classified: Secondary | ICD-10-CM | POA: Diagnosis not present

## 2019-03-03 DIAGNOSIS — Z20828 Contact with and (suspected) exposure to other viral communicable diseases: Secondary | ICD-10-CM | POA: Diagnosis not present

## 2019-03-04 DIAGNOSIS — R262 Difficulty in walking, not elsewhere classified: Secondary | ICD-10-CM | POA: Diagnosis not present

## 2019-03-04 DIAGNOSIS — G309 Alzheimer's disease, unspecified: Secondary | ICD-10-CM | POA: Diagnosis not present

## 2019-03-04 DIAGNOSIS — R2681 Unsteadiness on feet: Secondary | ICD-10-CM | POA: Diagnosis not present

## 2019-03-04 DIAGNOSIS — S62101D Fracture of unspecified carpal bone, right wrist, subsequent encounter for fracture with routine healing: Secondary | ICD-10-CM | POA: Diagnosis not present

## 2019-03-04 DIAGNOSIS — M6281 Muscle weakness (generalized): Secondary | ICD-10-CM | POA: Diagnosis not present

## 2019-03-05 DIAGNOSIS — G309 Alzheimer's disease, unspecified: Secondary | ICD-10-CM | POA: Diagnosis not present

## 2019-03-05 DIAGNOSIS — S62101D Fracture of unspecified carpal bone, right wrist, subsequent encounter for fracture with routine healing: Secondary | ICD-10-CM | POA: Diagnosis not present

## 2019-03-05 DIAGNOSIS — M6281 Muscle weakness (generalized): Secondary | ICD-10-CM | POA: Diagnosis not present

## 2019-03-05 DIAGNOSIS — R262 Difficulty in walking, not elsewhere classified: Secondary | ICD-10-CM | POA: Diagnosis not present

## 2019-03-05 DIAGNOSIS — R2681 Unsteadiness on feet: Secondary | ICD-10-CM | POA: Diagnosis not present

## 2019-03-06 DIAGNOSIS — K219 Gastro-esophageal reflux disease without esophagitis: Secondary | ICD-10-CM | POA: Diagnosis not present

## 2019-03-06 DIAGNOSIS — G309 Alzheimer's disease, unspecified: Secondary | ICD-10-CM | POA: Diagnosis not present

## 2019-03-06 DIAGNOSIS — D649 Anemia, unspecified: Secondary | ICD-10-CM | POA: Diagnosis not present

## 2019-03-06 DIAGNOSIS — I10 Essential (primary) hypertension: Secondary | ICD-10-CM | POA: Diagnosis not present

## 2019-03-06 DIAGNOSIS — R319 Hematuria, unspecified: Secondary | ICD-10-CM | POA: Diagnosis not present

## 2019-03-06 DIAGNOSIS — Z79899 Other long term (current) drug therapy: Secondary | ICD-10-CM | POA: Diagnosis not present

## 2019-03-06 DIAGNOSIS — J449 Chronic obstructive pulmonary disease, unspecified: Secondary | ICD-10-CM | POA: Diagnosis not present

## 2019-03-06 DIAGNOSIS — R079 Chest pain, unspecified: Secondary | ICD-10-CM | POA: Diagnosis not present

## 2019-03-06 DIAGNOSIS — R69 Illness, unspecified: Secondary | ICD-10-CM | POA: Diagnosis not present

## 2019-03-06 DIAGNOSIS — R52 Pain, unspecified: Secondary | ICD-10-CM | POA: Diagnosis not present

## 2019-03-06 DIAGNOSIS — N390 Urinary tract infection, site not specified: Secondary | ICD-10-CM | POA: Diagnosis not present

## 2019-03-06 DIAGNOSIS — R0602 Shortness of breath: Secondary | ICD-10-CM | POA: Diagnosis not present

## 2019-03-07 DIAGNOSIS — R2681 Unsteadiness on feet: Secondary | ICD-10-CM | POA: Diagnosis not present

## 2019-03-07 DIAGNOSIS — S62101D Fracture of unspecified carpal bone, right wrist, subsequent encounter for fracture with routine healing: Secondary | ICD-10-CM | POA: Diagnosis not present

## 2019-03-07 DIAGNOSIS — G309 Alzheimer's disease, unspecified: Secondary | ICD-10-CM | POA: Diagnosis not present

## 2019-03-07 DIAGNOSIS — R262 Difficulty in walking, not elsewhere classified: Secondary | ICD-10-CM | POA: Diagnosis not present

## 2019-03-07 DIAGNOSIS — M6281 Muscle weakness (generalized): Secondary | ICD-10-CM | POA: Diagnosis not present

## 2019-03-10 DIAGNOSIS — R2681 Unsteadiness on feet: Secondary | ICD-10-CM | POA: Diagnosis not present

## 2019-03-10 DIAGNOSIS — R262 Difficulty in walking, not elsewhere classified: Secondary | ICD-10-CM | POA: Diagnosis not present

## 2019-03-10 DIAGNOSIS — S62101D Fracture of unspecified carpal bone, right wrist, subsequent encounter for fracture with routine healing: Secondary | ICD-10-CM | POA: Diagnosis not present

## 2019-03-10 DIAGNOSIS — Z20828 Contact with and (suspected) exposure to other viral communicable diseases: Secondary | ICD-10-CM | POA: Diagnosis not present

## 2019-03-10 DIAGNOSIS — M6281 Muscle weakness (generalized): Secondary | ICD-10-CM | POA: Diagnosis not present

## 2019-03-10 DIAGNOSIS — G309 Alzheimer's disease, unspecified: Secondary | ICD-10-CM | POA: Diagnosis not present

## 2019-03-12 DIAGNOSIS — R262 Difficulty in walking, not elsewhere classified: Secondary | ICD-10-CM | POA: Diagnosis not present

## 2019-03-12 DIAGNOSIS — G309 Alzheimer's disease, unspecified: Secondary | ICD-10-CM | POA: Diagnosis not present

## 2019-03-12 DIAGNOSIS — M6281 Muscle weakness (generalized): Secondary | ICD-10-CM | POA: Diagnosis not present

## 2019-03-12 DIAGNOSIS — S62101D Fracture of unspecified carpal bone, right wrist, subsequent encounter for fracture with routine healing: Secondary | ICD-10-CM | POA: Diagnosis not present

## 2019-03-12 DIAGNOSIS — R2681 Unsteadiness on feet: Secondary | ICD-10-CM | POA: Diagnosis not present

## 2019-03-13 DIAGNOSIS — G309 Alzheimer's disease, unspecified: Secondary | ICD-10-CM | POA: Diagnosis not present

## 2019-03-13 DIAGNOSIS — R4701 Aphasia: Secondary | ICD-10-CM | POA: Diagnosis not present

## 2019-03-13 DIAGNOSIS — R262 Difficulty in walking, not elsewhere classified: Secondary | ICD-10-CM | POA: Diagnosis not present

## 2019-03-13 DIAGNOSIS — R2681 Unsteadiness on feet: Secondary | ICD-10-CM | POA: Diagnosis not present

## 2019-03-13 DIAGNOSIS — R1312 Dysphagia, oropharyngeal phase: Secondary | ICD-10-CM | POA: Diagnosis not present

## 2019-03-13 DIAGNOSIS — R41841 Cognitive communication deficit: Secondary | ICD-10-CM | POA: Diagnosis not present

## 2019-03-14 DIAGNOSIS — R1312 Dysphagia, oropharyngeal phase: Secondary | ICD-10-CM | POA: Diagnosis not present

## 2019-03-14 DIAGNOSIS — Z7409 Other reduced mobility: Secondary | ICD-10-CM | POA: Diagnosis not present

## 2019-03-14 DIAGNOSIS — L89622 Pressure ulcer of left heel, stage 2: Secondary | ICD-10-CM | POA: Diagnosis not present

## 2019-03-14 DIAGNOSIS — R4701 Aphasia: Secondary | ICD-10-CM | POA: Diagnosis not present

## 2019-03-14 DIAGNOSIS — M6281 Muscle weakness (generalized): Secondary | ICD-10-CM | POA: Diagnosis not present

## 2019-03-14 DIAGNOSIS — R2681 Unsteadiness on feet: Secondary | ICD-10-CM | POA: Diagnosis not present

## 2019-03-14 DIAGNOSIS — R262 Difficulty in walking, not elsewhere classified: Secondary | ICD-10-CM | POA: Diagnosis not present

## 2019-03-14 DIAGNOSIS — R41841 Cognitive communication deficit: Secondary | ICD-10-CM | POA: Diagnosis not present

## 2019-03-14 DIAGNOSIS — G309 Alzheimer's disease, unspecified: Secondary | ICD-10-CM | POA: Diagnosis not present

## 2019-03-17 DIAGNOSIS — R262 Difficulty in walking, not elsewhere classified: Secondary | ICD-10-CM | POA: Diagnosis not present

## 2019-03-17 DIAGNOSIS — R1312 Dysphagia, oropharyngeal phase: Secondary | ICD-10-CM | POA: Diagnosis not present

## 2019-03-17 DIAGNOSIS — R4701 Aphasia: Secondary | ICD-10-CM | POA: Diagnosis not present

## 2019-03-17 DIAGNOSIS — R2681 Unsteadiness on feet: Secondary | ICD-10-CM | POA: Diagnosis not present

## 2019-03-17 DIAGNOSIS — R41841 Cognitive communication deficit: Secondary | ICD-10-CM | POA: Diagnosis not present

## 2019-03-17 DIAGNOSIS — G309 Alzheimer's disease, unspecified: Secondary | ICD-10-CM | POA: Diagnosis not present

## 2019-03-18 DIAGNOSIS — G309 Alzheimer's disease, unspecified: Secondary | ICD-10-CM | POA: Diagnosis not present

## 2019-03-18 DIAGNOSIS — R262 Difficulty in walking, not elsewhere classified: Secondary | ICD-10-CM | POA: Diagnosis not present

## 2019-03-18 DIAGNOSIS — R2681 Unsteadiness on feet: Secondary | ICD-10-CM | POA: Diagnosis not present

## 2019-03-18 DIAGNOSIS — R41841 Cognitive communication deficit: Secondary | ICD-10-CM | POA: Diagnosis not present

## 2019-03-18 DIAGNOSIS — Z20828 Contact with and (suspected) exposure to other viral communicable diseases: Secondary | ICD-10-CM | POA: Diagnosis not present

## 2019-03-18 DIAGNOSIS — R4701 Aphasia: Secondary | ICD-10-CM | POA: Diagnosis not present

## 2019-03-18 DIAGNOSIS — R1312 Dysphagia, oropharyngeal phase: Secondary | ICD-10-CM | POA: Diagnosis not present

## 2019-03-19 DIAGNOSIS — I10 Essential (primary) hypertension: Secondary | ICD-10-CM | POA: Diagnosis not present

## 2019-03-19 DIAGNOSIS — Z7409 Other reduced mobility: Secondary | ICD-10-CM | POA: Diagnosis not present

## 2019-03-19 DIAGNOSIS — Z79899 Other long term (current) drug therapy: Secondary | ICD-10-CM | POA: Diagnosis not present

## 2019-03-19 DIAGNOSIS — J449 Chronic obstructive pulmonary disease, unspecified: Secondary | ICD-10-CM | POA: Diagnosis not present

## 2019-03-19 DIAGNOSIS — E785 Hyperlipidemia, unspecified: Secondary | ICD-10-CM | POA: Diagnosis not present

## 2019-03-19 DIAGNOSIS — L89622 Pressure ulcer of left heel, stage 2: Secondary | ICD-10-CM | POA: Diagnosis not present

## 2019-03-19 DIAGNOSIS — E559 Vitamin D deficiency, unspecified: Secondary | ICD-10-CM | POA: Diagnosis not present

## 2019-03-19 DIAGNOSIS — D649 Anemia, unspecified: Secondary | ICD-10-CM | POA: Diagnosis not present

## 2019-03-19 DIAGNOSIS — D519 Vitamin B12 deficiency anemia, unspecified: Secondary | ICD-10-CM | POA: Diagnosis not present

## 2019-03-19 DIAGNOSIS — M6281 Muscle weakness (generalized): Secondary | ICD-10-CM | POA: Diagnosis not present

## 2019-03-19 DIAGNOSIS — E039 Hypothyroidism, unspecified: Secondary | ICD-10-CM | POA: Diagnosis not present

## 2019-03-20 DIAGNOSIS — W19XXXA Unspecified fall, initial encounter: Secondary | ICD-10-CM | POA: Diagnosis not present

## 2019-03-20 DIAGNOSIS — M25551 Pain in right hip: Secondary | ICD-10-CM | POA: Diagnosis not present

## 2019-03-20 DIAGNOSIS — G309 Alzheimer's disease, unspecified: Secondary | ICD-10-CM | POA: Diagnosis not present

## 2019-03-20 DIAGNOSIS — R2681 Unsteadiness on feet: Secondary | ICD-10-CM | POA: Diagnosis not present

## 2019-03-20 DIAGNOSIS — R1312 Dysphagia, oropharyngeal phase: Secondary | ICD-10-CM | POA: Diagnosis not present

## 2019-03-20 DIAGNOSIS — R262 Difficulty in walking, not elsewhere classified: Secondary | ICD-10-CM | POA: Diagnosis not present

## 2019-03-20 DIAGNOSIS — Z79899 Other long term (current) drug therapy: Secondary | ICD-10-CM | POA: Diagnosis not present

## 2019-03-20 DIAGNOSIS — R4701 Aphasia: Secondary | ICD-10-CM | POA: Diagnosis not present

## 2019-03-20 DIAGNOSIS — R41841 Cognitive communication deficit: Secondary | ICD-10-CM | POA: Diagnosis not present

## 2019-03-20 DIAGNOSIS — R69 Illness, unspecified: Secondary | ICD-10-CM | POA: Diagnosis not present

## 2019-03-20 DIAGNOSIS — M25552 Pain in left hip: Secondary | ICD-10-CM | POA: Diagnosis not present

## 2019-03-21 DIAGNOSIS — R262 Difficulty in walking, not elsewhere classified: Secondary | ICD-10-CM | POA: Diagnosis not present

## 2019-03-21 DIAGNOSIS — R41841 Cognitive communication deficit: Secondary | ICD-10-CM | POA: Diagnosis not present

## 2019-03-21 DIAGNOSIS — R1312 Dysphagia, oropharyngeal phase: Secondary | ICD-10-CM | POA: Diagnosis not present

## 2019-03-21 DIAGNOSIS — R2681 Unsteadiness on feet: Secondary | ICD-10-CM | POA: Diagnosis not present

## 2019-03-21 DIAGNOSIS — R4701 Aphasia: Secondary | ICD-10-CM | POA: Diagnosis not present

## 2019-03-21 DIAGNOSIS — Z20828 Contact with and (suspected) exposure to other viral communicable diseases: Secondary | ICD-10-CM | POA: Diagnosis not present

## 2019-03-21 DIAGNOSIS — G309 Alzheimer's disease, unspecified: Secondary | ICD-10-CM | POA: Diagnosis not present

## 2019-03-24 DIAGNOSIS — R1312 Dysphagia, oropharyngeal phase: Secondary | ICD-10-CM | POA: Diagnosis not present

## 2019-03-24 DIAGNOSIS — R2681 Unsteadiness on feet: Secondary | ICD-10-CM | POA: Diagnosis not present

## 2019-03-24 DIAGNOSIS — R262 Difficulty in walking, not elsewhere classified: Secondary | ICD-10-CM | POA: Diagnosis not present

## 2019-03-24 DIAGNOSIS — R4701 Aphasia: Secondary | ICD-10-CM | POA: Diagnosis not present

## 2019-03-24 DIAGNOSIS — R41841 Cognitive communication deficit: Secondary | ICD-10-CM | POA: Diagnosis not present

## 2019-03-24 DIAGNOSIS — G309 Alzheimer's disease, unspecified: Secondary | ICD-10-CM | POA: Diagnosis not present

## 2019-03-25 DIAGNOSIS — R2681 Unsteadiness on feet: Secondary | ICD-10-CM | POA: Diagnosis not present

## 2019-03-25 DIAGNOSIS — G309 Alzheimer's disease, unspecified: Secondary | ICD-10-CM | POA: Diagnosis not present

## 2019-03-25 DIAGNOSIS — R262 Difficulty in walking, not elsewhere classified: Secondary | ICD-10-CM | POA: Diagnosis not present

## 2019-03-25 DIAGNOSIS — R1312 Dysphagia, oropharyngeal phase: Secondary | ICD-10-CM | POA: Diagnosis not present

## 2019-03-25 DIAGNOSIS — R4701 Aphasia: Secondary | ICD-10-CM | POA: Diagnosis not present

## 2019-03-25 DIAGNOSIS — R41841 Cognitive communication deficit: Secondary | ICD-10-CM | POA: Diagnosis not present

## 2019-03-26 DIAGNOSIS — G309 Alzheimer's disease, unspecified: Secondary | ICD-10-CM | POA: Diagnosis not present

## 2019-03-26 DIAGNOSIS — R4701 Aphasia: Secondary | ICD-10-CM | POA: Diagnosis not present

## 2019-03-26 DIAGNOSIS — Z20828 Contact with and (suspected) exposure to other viral communicable diseases: Secondary | ICD-10-CM | POA: Diagnosis not present

## 2019-03-26 DIAGNOSIS — R262 Difficulty in walking, not elsewhere classified: Secondary | ICD-10-CM | POA: Diagnosis not present

## 2019-03-26 DIAGNOSIS — R41841 Cognitive communication deficit: Secondary | ICD-10-CM | POA: Diagnosis not present

## 2019-03-26 DIAGNOSIS — R1312 Dysphagia, oropharyngeal phase: Secondary | ICD-10-CM | POA: Diagnosis not present

## 2019-03-26 DIAGNOSIS — R2681 Unsteadiness on feet: Secondary | ICD-10-CM | POA: Diagnosis not present

## 2019-03-27 DIAGNOSIS — R262 Difficulty in walking, not elsewhere classified: Secondary | ICD-10-CM | POA: Diagnosis not present

## 2019-03-27 DIAGNOSIS — G309 Alzheimer's disease, unspecified: Secondary | ICD-10-CM | POA: Diagnosis not present

## 2019-03-27 DIAGNOSIS — R1312 Dysphagia, oropharyngeal phase: Secondary | ICD-10-CM | POA: Diagnosis not present

## 2019-03-27 DIAGNOSIS — R41841 Cognitive communication deficit: Secondary | ICD-10-CM | POA: Diagnosis not present

## 2019-03-27 DIAGNOSIS — R2681 Unsteadiness on feet: Secondary | ICD-10-CM | POA: Diagnosis not present

## 2019-03-27 DIAGNOSIS — R4701 Aphasia: Secondary | ICD-10-CM | POA: Diagnosis not present

## 2019-03-28 DIAGNOSIS — R41841 Cognitive communication deficit: Secondary | ICD-10-CM | POA: Diagnosis not present

## 2019-03-28 DIAGNOSIS — Z7409 Other reduced mobility: Secondary | ICD-10-CM | POA: Diagnosis not present

## 2019-03-28 DIAGNOSIS — R4701 Aphasia: Secondary | ICD-10-CM | POA: Diagnosis not present

## 2019-03-28 DIAGNOSIS — R2681 Unsteadiness on feet: Secondary | ICD-10-CM | POA: Diagnosis not present

## 2019-03-28 DIAGNOSIS — M6281 Muscle weakness (generalized): Secondary | ICD-10-CM | POA: Diagnosis not present

## 2019-03-28 DIAGNOSIS — L89622 Pressure ulcer of left heel, stage 2: Secondary | ICD-10-CM | POA: Diagnosis not present

## 2019-03-28 DIAGNOSIS — G309 Alzheimer's disease, unspecified: Secondary | ICD-10-CM | POA: Diagnosis not present

## 2019-03-28 DIAGNOSIS — R1312 Dysphagia, oropharyngeal phase: Secondary | ICD-10-CM | POA: Diagnosis not present

## 2019-03-28 DIAGNOSIS — R262 Difficulty in walking, not elsewhere classified: Secondary | ICD-10-CM | POA: Diagnosis not present

## 2019-03-30 DIAGNOSIS — Z20828 Contact with and (suspected) exposure to other viral communicable diseases: Secondary | ICD-10-CM | POA: Diagnosis not present

## 2019-03-31 DIAGNOSIS — G309 Alzheimer's disease, unspecified: Secondary | ICD-10-CM | POA: Diagnosis not present

## 2019-03-31 DIAGNOSIS — R2681 Unsteadiness on feet: Secondary | ICD-10-CM | POA: Diagnosis not present

## 2019-03-31 DIAGNOSIS — R4701 Aphasia: Secondary | ICD-10-CM | POA: Diagnosis not present

## 2019-03-31 DIAGNOSIS — R41841 Cognitive communication deficit: Secondary | ICD-10-CM | POA: Diagnosis not present

## 2019-03-31 DIAGNOSIS — R1312 Dysphagia, oropharyngeal phase: Secondary | ICD-10-CM | POA: Diagnosis not present

## 2019-03-31 DIAGNOSIS — R262 Difficulty in walking, not elsewhere classified: Secondary | ICD-10-CM | POA: Diagnosis not present

## 2019-04-01 DIAGNOSIS — R262 Difficulty in walking, not elsewhere classified: Secondary | ICD-10-CM | POA: Diagnosis not present

## 2019-04-01 DIAGNOSIS — R1312 Dysphagia, oropharyngeal phase: Secondary | ICD-10-CM | POA: Diagnosis not present

## 2019-04-01 DIAGNOSIS — R4701 Aphasia: Secondary | ICD-10-CM | POA: Diagnosis not present

## 2019-04-01 DIAGNOSIS — G309 Alzheimer's disease, unspecified: Secondary | ICD-10-CM | POA: Diagnosis not present

## 2019-04-01 DIAGNOSIS — R41841 Cognitive communication deficit: Secondary | ICD-10-CM | POA: Diagnosis not present

## 2019-04-01 DIAGNOSIS — R2681 Unsteadiness on feet: Secondary | ICD-10-CM | POA: Diagnosis not present

## 2019-04-02 DIAGNOSIS — R41841 Cognitive communication deficit: Secondary | ICD-10-CM | POA: Diagnosis not present

## 2019-04-02 DIAGNOSIS — R2681 Unsteadiness on feet: Secondary | ICD-10-CM | POA: Diagnosis not present

## 2019-04-02 DIAGNOSIS — R262 Difficulty in walking, not elsewhere classified: Secondary | ICD-10-CM | POA: Diagnosis not present

## 2019-04-02 DIAGNOSIS — G309 Alzheimer's disease, unspecified: Secondary | ICD-10-CM | POA: Diagnosis not present

## 2019-04-02 DIAGNOSIS — R4701 Aphasia: Secondary | ICD-10-CM | POA: Diagnosis not present

## 2019-04-02 DIAGNOSIS — R1312 Dysphagia, oropharyngeal phase: Secondary | ICD-10-CM | POA: Diagnosis not present

## 2019-04-03 DIAGNOSIS — R41841 Cognitive communication deficit: Secondary | ICD-10-CM | POA: Diagnosis not present

## 2019-04-03 DIAGNOSIS — R1312 Dysphagia, oropharyngeal phase: Secondary | ICD-10-CM | POA: Diagnosis not present

## 2019-04-03 DIAGNOSIS — R4701 Aphasia: Secondary | ICD-10-CM | POA: Diagnosis not present

## 2019-04-03 DIAGNOSIS — G309 Alzheimer's disease, unspecified: Secondary | ICD-10-CM | POA: Diagnosis not present

## 2019-04-03 DIAGNOSIS — R262 Difficulty in walking, not elsewhere classified: Secondary | ICD-10-CM | POA: Diagnosis not present

## 2019-04-03 DIAGNOSIS — R2681 Unsteadiness on feet: Secondary | ICD-10-CM | POA: Diagnosis not present

## 2019-04-04 DIAGNOSIS — R2681 Unsteadiness on feet: Secondary | ICD-10-CM | POA: Diagnosis not present

## 2019-04-04 DIAGNOSIS — R41841 Cognitive communication deficit: Secondary | ICD-10-CM | POA: Diagnosis not present

## 2019-04-04 DIAGNOSIS — M6281 Muscle weakness (generalized): Secondary | ICD-10-CM | POA: Diagnosis not present

## 2019-04-04 DIAGNOSIS — R262 Difficulty in walking, not elsewhere classified: Secondary | ICD-10-CM | POA: Diagnosis not present

## 2019-04-04 DIAGNOSIS — G309 Alzheimer's disease, unspecified: Secondary | ICD-10-CM | POA: Diagnosis not present

## 2019-04-04 DIAGNOSIS — L89622 Pressure ulcer of left heel, stage 2: Secondary | ICD-10-CM | POA: Diagnosis not present

## 2019-04-04 DIAGNOSIS — R4701 Aphasia: Secondary | ICD-10-CM | POA: Diagnosis not present

## 2019-04-04 DIAGNOSIS — Z7409 Other reduced mobility: Secondary | ICD-10-CM | POA: Diagnosis not present

## 2019-04-04 DIAGNOSIS — R1312 Dysphagia, oropharyngeal phase: Secondary | ICD-10-CM | POA: Diagnosis not present

## 2019-04-07 DIAGNOSIS — R262 Difficulty in walking, not elsewhere classified: Secondary | ICD-10-CM | POA: Diagnosis not present

## 2019-04-07 DIAGNOSIS — R41841 Cognitive communication deficit: Secondary | ICD-10-CM | POA: Diagnosis not present

## 2019-04-07 DIAGNOSIS — R4701 Aphasia: Secondary | ICD-10-CM | POA: Diagnosis not present

## 2019-04-07 DIAGNOSIS — Z20828 Contact with and (suspected) exposure to other viral communicable diseases: Secondary | ICD-10-CM | POA: Diagnosis not present

## 2019-04-07 DIAGNOSIS — R1312 Dysphagia, oropharyngeal phase: Secondary | ICD-10-CM | POA: Diagnosis not present

## 2019-04-07 DIAGNOSIS — G309 Alzheimer's disease, unspecified: Secondary | ICD-10-CM | POA: Diagnosis not present

## 2019-04-07 DIAGNOSIS — R2681 Unsteadiness on feet: Secondary | ICD-10-CM | POA: Diagnosis not present

## 2019-04-08 DIAGNOSIS — R41841 Cognitive communication deficit: Secondary | ICD-10-CM | POA: Diagnosis not present

## 2019-04-08 DIAGNOSIS — R1312 Dysphagia, oropharyngeal phase: Secondary | ICD-10-CM | POA: Diagnosis not present

## 2019-04-08 DIAGNOSIS — R2681 Unsteadiness on feet: Secondary | ICD-10-CM | POA: Diagnosis not present

## 2019-04-08 DIAGNOSIS — G309 Alzheimer's disease, unspecified: Secondary | ICD-10-CM | POA: Diagnosis not present

## 2019-04-08 DIAGNOSIS — R262 Difficulty in walking, not elsewhere classified: Secondary | ICD-10-CM | POA: Diagnosis not present

## 2019-04-08 DIAGNOSIS — R4701 Aphasia: Secondary | ICD-10-CM | POA: Diagnosis not present

## 2019-04-09 DIAGNOSIS — R41841 Cognitive communication deficit: Secondary | ICD-10-CM | POA: Diagnosis not present

## 2019-04-09 DIAGNOSIS — L89622 Pressure ulcer of left heel, stage 2: Secondary | ICD-10-CM | POA: Diagnosis not present

## 2019-04-09 DIAGNOSIS — Z7409 Other reduced mobility: Secondary | ICD-10-CM | POA: Diagnosis not present

## 2019-04-09 DIAGNOSIS — M6281 Muscle weakness (generalized): Secondary | ICD-10-CM | POA: Diagnosis not present

## 2019-04-09 DIAGNOSIS — R4701 Aphasia: Secondary | ICD-10-CM | POA: Diagnosis not present

## 2019-04-09 DIAGNOSIS — R1312 Dysphagia, oropharyngeal phase: Secondary | ICD-10-CM | POA: Diagnosis not present

## 2019-04-09 DIAGNOSIS — G309 Alzheimer's disease, unspecified: Secondary | ICD-10-CM | POA: Diagnosis not present

## 2019-04-09 DIAGNOSIS — R2681 Unsteadiness on feet: Secondary | ICD-10-CM | POA: Diagnosis not present

## 2019-04-09 DIAGNOSIS — R262 Difficulty in walking, not elsewhere classified: Secondary | ICD-10-CM | POA: Diagnosis not present

## 2019-04-10 DIAGNOSIS — R41841 Cognitive communication deficit: Secondary | ICD-10-CM | POA: Diagnosis not present

## 2019-04-10 DIAGNOSIS — R1312 Dysphagia, oropharyngeal phase: Secondary | ICD-10-CM | POA: Diagnosis not present

## 2019-04-10 DIAGNOSIS — R2681 Unsteadiness on feet: Secondary | ICD-10-CM | POA: Diagnosis not present

## 2019-04-10 DIAGNOSIS — G309 Alzheimer's disease, unspecified: Secondary | ICD-10-CM | POA: Diagnosis not present

## 2019-04-10 DIAGNOSIS — R262 Difficulty in walking, not elsewhere classified: Secondary | ICD-10-CM | POA: Diagnosis not present

## 2019-04-10 DIAGNOSIS — R4701 Aphasia: Secondary | ICD-10-CM | POA: Diagnosis not present

## 2019-04-11 DIAGNOSIS — G309 Alzheimer's disease, unspecified: Secondary | ICD-10-CM | POA: Diagnosis not present

## 2019-04-11 DIAGNOSIS — R2681 Unsteadiness on feet: Secondary | ICD-10-CM | POA: Diagnosis not present

## 2019-04-11 DIAGNOSIS — R4701 Aphasia: Secondary | ICD-10-CM | POA: Diagnosis not present

## 2019-04-11 DIAGNOSIS — R262 Difficulty in walking, not elsewhere classified: Secondary | ICD-10-CM | POA: Diagnosis not present

## 2019-04-11 DIAGNOSIS — R1312 Dysphagia, oropharyngeal phase: Secondary | ICD-10-CM | POA: Diagnosis not present

## 2019-04-11 DIAGNOSIS — R41841 Cognitive communication deficit: Secondary | ICD-10-CM | POA: Diagnosis not present

## 2019-04-12 DIAGNOSIS — Z20828 Contact with and (suspected) exposure to other viral communicable diseases: Secondary | ICD-10-CM | POA: Diagnosis not present

## 2019-04-13 DIAGNOSIS — R4701 Aphasia: Secondary | ICD-10-CM | POA: Diagnosis not present

## 2019-04-13 DIAGNOSIS — G309 Alzheimer's disease, unspecified: Secondary | ICD-10-CM | POA: Diagnosis not present

## 2019-04-13 DIAGNOSIS — R2681 Unsteadiness on feet: Secondary | ICD-10-CM | POA: Diagnosis not present

## 2019-04-13 DIAGNOSIS — R1312 Dysphagia, oropharyngeal phase: Secondary | ICD-10-CM | POA: Diagnosis not present

## 2019-04-13 DIAGNOSIS — R41841 Cognitive communication deficit: Secondary | ICD-10-CM | POA: Diagnosis not present

## 2019-04-13 DIAGNOSIS — R262 Difficulty in walking, not elsewhere classified: Secondary | ICD-10-CM | POA: Diagnosis not present

## 2019-04-14 DIAGNOSIS — R262 Difficulty in walking, not elsewhere classified: Secondary | ICD-10-CM | POA: Diagnosis not present

## 2019-04-14 DIAGNOSIS — R41841 Cognitive communication deficit: Secondary | ICD-10-CM | POA: Diagnosis not present

## 2019-04-14 DIAGNOSIS — G309 Alzheimer's disease, unspecified: Secondary | ICD-10-CM | POA: Diagnosis not present

## 2019-04-14 DIAGNOSIS — R1312 Dysphagia, oropharyngeal phase: Secondary | ICD-10-CM | POA: Diagnosis not present

## 2019-04-14 DIAGNOSIS — R4701 Aphasia: Secondary | ICD-10-CM | POA: Diagnosis not present

## 2019-04-14 DIAGNOSIS — R2681 Unsteadiness on feet: Secondary | ICD-10-CM | POA: Diagnosis not present

## 2019-04-15 DIAGNOSIS — R2681 Unsteadiness on feet: Secondary | ICD-10-CM | POA: Diagnosis not present

## 2019-04-15 DIAGNOSIS — R41841 Cognitive communication deficit: Secondary | ICD-10-CM | POA: Diagnosis not present

## 2019-04-15 DIAGNOSIS — R4701 Aphasia: Secondary | ICD-10-CM | POA: Diagnosis not present

## 2019-04-15 DIAGNOSIS — G309 Alzheimer's disease, unspecified: Secondary | ICD-10-CM | POA: Diagnosis not present

## 2019-04-15 DIAGNOSIS — R1312 Dysphagia, oropharyngeal phase: Secondary | ICD-10-CM | POA: Diagnosis not present

## 2019-04-15 DIAGNOSIS — R262 Difficulty in walking, not elsewhere classified: Secondary | ICD-10-CM | POA: Diagnosis not present

## 2019-04-16 DIAGNOSIS — R2681 Unsteadiness on feet: Secondary | ICD-10-CM | POA: Diagnosis not present

## 2019-04-16 DIAGNOSIS — M6281 Muscle weakness (generalized): Secondary | ICD-10-CM | POA: Diagnosis not present

## 2019-04-16 DIAGNOSIS — R1312 Dysphagia, oropharyngeal phase: Secondary | ICD-10-CM | POA: Diagnosis not present

## 2019-04-16 DIAGNOSIS — R262 Difficulty in walking, not elsewhere classified: Secondary | ICD-10-CM | POA: Diagnosis not present

## 2019-04-16 DIAGNOSIS — L89622 Pressure ulcer of left heel, stage 2: Secondary | ICD-10-CM | POA: Diagnosis not present

## 2019-04-16 DIAGNOSIS — R4701 Aphasia: Secondary | ICD-10-CM | POA: Diagnosis not present

## 2019-04-16 DIAGNOSIS — Z7409 Other reduced mobility: Secondary | ICD-10-CM | POA: Diagnosis not present

## 2019-04-16 DIAGNOSIS — R41841 Cognitive communication deficit: Secondary | ICD-10-CM | POA: Diagnosis not present

## 2019-04-16 DIAGNOSIS — G309 Alzheimer's disease, unspecified: Secondary | ICD-10-CM | POA: Diagnosis not present

## 2019-04-17 DIAGNOSIS — R41841 Cognitive communication deficit: Secondary | ICD-10-CM | POA: Diagnosis not present

## 2019-04-17 DIAGNOSIS — G309 Alzheimer's disease, unspecified: Secondary | ICD-10-CM | POA: Diagnosis not present

## 2019-04-17 DIAGNOSIS — R1312 Dysphagia, oropharyngeal phase: Secondary | ICD-10-CM | POA: Diagnosis not present

## 2019-04-17 DIAGNOSIS — R262 Difficulty in walking, not elsewhere classified: Secondary | ICD-10-CM | POA: Diagnosis not present

## 2019-04-17 DIAGNOSIS — R4701 Aphasia: Secondary | ICD-10-CM | POA: Diagnosis not present

## 2019-04-17 DIAGNOSIS — R2681 Unsteadiness on feet: Secondary | ICD-10-CM | POA: Diagnosis not present

## 2019-04-18 DIAGNOSIS — R1312 Dysphagia, oropharyngeal phase: Secondary | ICD-10-CM | POA: Diagnosis not present

## 2019-04-18 DIAGNOSIS — R41841 Cognitive communication deficit: Secondary | ICD-10-CM | POA: Diagnosis not present

## 2019-04-18 DIAGNOSIS — G309 Alzheimer's disease, unspecified: Secondary | ICD-10-CM | POA: Diagnosis not present

## 2019-04-18 DIAGNOSIS — R4701 Aphasia: Secondary | ICD-10-CM | POA: Diagnosis not present

## 2019-04-18 DIAGNOSIS — R2681 Unsteadiness on feet: Secondary | ICD-10-CM | POA: Diagnosis not present

## 2019-04-18 DIAGNOSIS — R262 Difficulty in walking, not elsewhere classified: Secondary | ICD-10-CM | POA: Diagnosis not present

## 2019-04-21 DIAGNOSIS — R4701 Aphasia: Secondary | ICD-10-CM | POA: Diagnosis not present

## 2019-04-21 DIAGNOSIS — R1312 Dysphagia, oropharyngeal phase: Secondary | ICD-10-CM | POA: Diagnosis not present

## 2019-04-21 DIAGNOSIS — G309 Alzheimer's disease, unspecified: Secondary | ICD-10-CM | POA: Diagnosis not present

## 2019-04-21 DIAGNOSIS — R41841 Cognitive communication deficit: Secondary | ICD-10-CM | POA: Diagnosis not present

## 2019-04-21 DIAGNOSIS — R2681 Unsteadiness on feet: Secondary | ICD-10-CM | POA: Diagnosis not present

## 2019-04-21 DIAGNOSIS — R262 Difficulty in walking, not elsewhere classified: Secondary | ICD-10-CM | POA: Diagnosis not present

## 2019-04-22 DIAGNOSIS — R1312 Dysphagia, oropharyngeal phase: Secondary | ICD-10-CM | POA: Diagnosis not present

## 2019-04-22 DIAGNOSIS — R2681 Unsteadiness on feet: Secondary | ICD-10-CM | POA: Diagnosis not present

## 2019-04-22 DIAGNOSIS — R262 Difficulty in walking, not elsewhere classified: Secondary | ICD-10-CM | POA: Diagnosis not present

## 2019-04-22 DIAGNOSIS — R4701 Aphasia: Secondary | ICD-10-CM | POA: Diagnosis not present

## 2019-04-22 DIAGNOSIS — G309 Alzheimer's disease, unspecified: Secondary | ICD-10-CM | POA: Diagnosis not present

## 2019-04-22 DIAGNOSIS — R41841 Cognitive communication deficit: Secondary | ICD-10-CM | POA: Diagnosis not present

## 2019-04-23 DIAGNOSIS — L89622 Pressure ulcer of left heel, stage 2: Secondary | ICD-10-CM | POA: Diagnosis not present

## 2019-04-23 DIAGNOSIS — M79674 Pain in right toe(s): Secondary | ICD-10-CM | POA: Diagnosis not present

## 2019-04-23 DIAGNOSIS — B351 Tinea unguium: Secondary | ICD-10-CM | POA: Diagnosis not present

## 2019-04-23 DIAGNOSIS — M6281 Muscle weakness (generalized): Secondary | ICD-10-CM | POA: Diagnosis not present

## 2019-04-23 DIAGNOSIS — R4701 Aphasia: Secondary | ICD-10-CM | POA: Diagnosis not present

## 2019-04-23 DIAGNOSIS — Z7409 Other reduced mobility: Secondary | ICD-10-CM | POA: Diagnosis not present

## 2019-04-23 DIAGNOSIS — R2681 Unsteadiness on feet: Secondary | ICD-10-CM | POA: Diagnosis not present

## 2019-04-23 DIAGNOSIS — R262 Difficulty in walking, not elsewhere classified: Secondary | ICD-10-CM | POA: Diagnosis not present

## 2019-04-23 DIAGNOSIS — R1312 Dysphagia, oropharyngeal phase: Secondary | ICD-10-CM | POA: Diagnosis not present

## 2019-04-23 DIAGNOSIS — R41841 Cognitive communication deficit: Secondary | ICD-10-CM | POA: Diagnosis not present

## 2019-04-23 DIAGNOSIS — G309 Alzheimer's disease, unspecified: Secondary | ICD-10-CM | POA: Diagnosis not present

## 2019-04-23 DIAGNOSIS — M79675 Pain in left toe(s): Secondary | ICD-10-CM | POA: Diagnosis not present

## 2019-04-24 DIAGNOSIS — R41841 Cognitive communication deficit: Secondary | ICD-10-CM | POA: Diagnosis not present

## 2019-04-24 DIAGNOSIS — R4701 Aphasia: Secondary | ICD-10-CM | POA: Diagnosis not present

## 2019-04-24 DIAGNOSIS — R2681 Unsteadiness on feet: Secondary | ICD-10-CM | POA: Diagnosis not present

## 2019-04-24 DIAGNOSIS — R262 Difficulty in walking, not elsewhere classified: Secondary | ICD-10-CM | POA: Diagnosis not present

## 2019-04-24 DIAGNOSIS — R1312 Dysphagia, oropharyngeal phase: Secondary | ICD-10-CM | POA: Diagnosis not present

## 2019-04-24 DIAGNOSIS — G309 Alzheimer's disease, unspecified: Secondary | ICD-10-CM | POA: Diagnosis not present

## 2019-04-24 DIAGNOSIS — L89622 Pressure ulcer of left heel, stage 2: Secondary | ICD-10-CM | POA: Diagnosis not present

## 2019-04-25 DIAGNOSIS — G309 Alzheimer's disease, unspecified: Secondary | ICD-10-CM | POA: Diagnosis not present

## 2019-04-25 DIAGNOSIS — R2681 Unsteadiness on feet: Secondary | ICD-10-CM | POA: Diagnosis not present

## 2019-04-25 DIAGNOSIS — R1312 Dysphagia, oropharyngeal phase: Secondary | ICD-10-CM | POA: Diagnosis not present

## 2019-04-25 DIAGNOSIS — R41841 Cognitive communication deficit: Secondary | ICD-10-CM | POA: Diagnosis not present

## 2019-04-25 DIAGNOSIS — R4701 Aphasia: Secondary | ICD-10-CM | POA: Diagnosis not present

## 2019-04-25 DIAGNOSIS — R262 Difficulty in walking, not elsewhere classified: Secondary | ICD-10-CM | POA: Diagnosis not present

## 2019-04-28 DIAGNOSIS — R2681 Unsteadiness on feet: Secondary | ICD-10-CM | POA: Diagnosis not present

## 2019-04-28 DIAGNOSIS — R41841 Cognitive communication deficit: Secondary | ICD-10-CM | POA: Diagnosis not present

## 2019-04-28 DIAGNOSIS — R4701 Aphasia: Secondary | ICD-10-CM | POA: Diagnosis not present

## 2019-04-28 DIAGNOSIS — R262 Difficulty in walking, not elsewhere classified: Secondary | ICD-10-CM | POA: Diagnosis not present

## 2019-04-28 DIAGNOSIS — R1312 Dysphagia, oropharyngeal phase: Secondary | ICD-10-CM | POA: Diagnosis not present

## 2019-04-28 DIAGNOSIS — G309 Alzheimer's disease, unspecified: Secondary | ICD-10-CM | POA: Diagnosis not present

## 2019-04-29 DIAGNOSIS — J449 Chronic obstructive pulmonary disease, unspecified: Secondary | ICD-10-CM | POA: Diagnosis not present

## 2019-04-29 DIAGNOSIS — E559 Vitamin D deficiency, unspecified: Secondary | ICD-10-CM | POA: Diagnosis not present

## 2019-04-29 DIAGNOSIS — R4701 Aphasia: Secondary | ICD-10-CM | POA: Diagnosis not present

## 2019-04-29 DIAGNOSIS — R41841 Cognitive communication deficit: Secondary | ICD-10-CM | POA: Diagnosis not present

## 2019-04-29 DIAGNOSIS — R2681 Unsteadiness on feet: Secondary | ICD-10-CM | POA: Diagnosis not present

## 2019-04-29 DIAGNOSIS — G309 Alzheimer's disease, unspecified: Secondary | ICD-10-CM | POA: Diagnosis not present

## 2019-04-29 DIAGNOSIS — I10 Essential (primary) hypertension: Secondary | ICD-10-CM | POA: Diagnosis not present

## 2019-04-29 DIAGNOSIS — R262 Difficulty in walking, not elsewhere classified: Secondary | ICD-10-CM | POA: Diagnosis not present

## 2019-04-29 DIAGNOSIS — R1312 Dysphagia, oropharyngeal phase: Secondary | ICD-10-CM | POA: Diagnosis not present

## 2019-04-30 DIAGNOSIS — R4701 Aphasia: Secondary | ICD-10-CM | POA: Diagnosis not present

## 2019-04-30 DIAGNOSIS — Z7409 Other reduced mobility: Secondary | ICD-10-CM | POA: Diagnosis not present

## 2019-04-30 DIAGNOSIS — R1312 Dysphagia, oropharyngeal phase: Secondary | ICD-10-CM | POA: Diagnosis not present

## 2019-04-30 DIAGNOSIS — L89622 Pressure ulcer of left heel, stage 2: Secondary | ICD-10-CM | POA: Diagnosis not present

## 2019-04-30 DIAGNOSIS — R2681 Unsteadiness on feet: Secondary | ICD-10-CM | POA: Diagnosis not present

## 2019-04-30 DIAGNOSIS — M6281 Muscle weakness (generalized): Secondary | ICD-10-CM | POA: Diagnosis not present

## 2019-04-30 DIAGNOSIS — R41841 Cognitive communication deficit: Secondary | ICD-10-CM | POA: Diagnosis not present

## 2019-04-30 DIAGNOSIS — R262 Difficulty in walking, not elsewhere classified: Secondary | ICD-10-CM | POA: Diagnosis not present

## 2019-04-30 DIAGNOSIS — G309 Alzheimer's disease, unspecified: Secondary | ICD-10-CM | POA: Diagnosis not present

## 2019-05-01 DIAGNOSIS — G309 Alzheimer's disease, unspecified: Secondary | ICD-10-CM | POA: Diagnosis not present

## 2019-05-01 DIAGNOSIS — R69 Illness, unspecified: Secondary | ICD-10-CM | POA: Diagnosis not present

## 2019-05-02 DIAGNOSIS — R1312 Dysphagia, oropharyngeal phase: Secondary | ICD-10-CM | POA: Diagnosis not present

## 2019-05-02 DIAGNOSIS — R4701 Aphasia: Secondary | ICD-10-CM | POA: Diagnosis not present

## 2019-05-02 DIAGNOSIS — R41841 Cognitive communication deficit: Secondary | ICD-10-CM | POA: Diagnosis not present

## 2019-05-02 DIAGNOSIS — R262 Difficulty in walking, not elsewhere classified: Secondary | ICD-10-CM | POA: Diagnosis not present

## 2019-05-02 DIAGNOSIS — R2681 Unsteadiness on feet: Secondary | ICD-10-CM | POA: Diagnosis not present

## 2019-05-02 DIAGNOSIS — G309 Alzheimer's disease, unspecified: Secondary | ICD-10-CM | POA: Diagnosis not present

## 2019-05-05 DIAGNOSIS — G309 Alzheimer's disease, unspecified: Secondary | ICD-10-CM | POA: Diagnosis not present

## 2019-05-05 DIAGNOSIS — R1312 Dysphagia, oropharyngeal phase: Secondary | ICD-10-CM | POA: Diagnosis not present

## 2019-05-05 DIAGNOSIS — R41841 Cognitive communication deficit: Secondary | ICD-10-CM | POA: Diagnosis not present

## 2019-05-05 DIAGNOSIS — R262 Difficulty in walking, not elsewhere classified: Secondary | ICD-10-CM | POA: Diagnosis not present

## 2019-05-05 DIAGNOSIS — R2681 Unsteadiness on feet: Secondary | ICD-10-CM | POA: Diagnosis not present

## 2019-05-05 DIAGNOSIS — R4701 Aphasia: Secondary | ICD-10-CM | POA: Diagnosis not present

## 2019-05-06 DIAGNOSIS — R4701 Aphasia: Secondary | ICD-10-CM | POA: Diagnosis not present

## 2019-05-06 DIAGNOSIS — R1312 Dysphagia, oropharyngeal phase: Secondary | ICD-10-CM | POA: Diagnosis not present

## 2019-05-06 DIAGNOSIS — R41841 Cognitive communication deficit: Secondary | ICD-10-CM | POA: Diagnosis not present

## 2019-05-06 DIAGNOSIS — R2681 Unsteadiness on feet: Secondary | ICD-10-CM | POA: Diagnosis not present

## 2019-05-06 DIAGNOSIS — R262 Difficulty in walking, not elsewhere classified: Secondary | ICD-10-CM | POA: Diagnosis not present

## 2019-05-06 DIAGNOSIS — G309 Alzheimer's disease, unspecified: Secondary | ICD-10-CM | POA: Diagnosis not present

## 2019-05-07 DIAGNOSIS — Z7409 Other reduced mobility: Secondary | ICD-10-CM | POA: Diagnosis not present

## 2019-05-07 DIAGNOSIS — E559 Vitamin D deficiency, unspecified: Secondary | ICD-10-CM | POA: Diagnosis not present

## 2019-05-07 DIAGNOSIS — L89622 Pressure ulcer of left heel, stage 2: Secondary | ICD-10-CM | POA: Diagnosis not present

## 2019-05-07 DIAGNOSIS — M6281 Muscle weakness (generalized): Secondary | ICD-10-CM | POA: Diagnosis not present

## 2019-05-12 DIAGNOSIS — E559 Vitamin D deficiency, unspecified: Secondary | ICD-10-CM | POA: Diagnosis not present

## 2019-05-12 DIAGNOSIS — R69 Illness, unspecified: Secondary | ICD-10-CM | POA: Diagnosis not present

## 2019-05-12 DIAGNOSIS — G309 Alzheimer's disease, unspecified: Secondary | ICD-10-CM | POA: Diagnosis not present

## 2019-05-14 DIAGNOSIS — M6281 Muscle weakness (generalized): Secondary | ICD-10-CM | POA: Diagnosis not present

## 2019-05-14 DIAGNOSIS — Z7409 Other reduced mobility: Secondary | ICD-10-CM | POA: Diagnosis not present

## 2019-05-14 DIAGNOSIS — L89622 Pressure ulcer of left heel, stage 2: Secondary | ICD-10-CM | POA: Diagnosis not present

## 2019-05-21 DIAGNOSIS — L89622 Pressure ulcer of left heel, stage 2: Secondary | ICD-10-CM | POA: Diagnosis not present

## 2019-05-21 DIAGNOSIS — M6281 Muscle weakness (generalized): Secondary | ICD-10-CM | POA: Diagnosis not present

## 2019-05-21 DIAGNOSIS — Z7409 Other reduced mobility: Secondary | ICD-10-CM | POA: Diagnosis not present

## 2019-05-21 DIAGNOSIS — Z20828 Contact with and (suspected) exposure to other viral communicable diseases: Secondary | ICD-10-CM | POA: Diagnosis not present

## 2019-05-22 DIAGNOSIS — R69 Illness, unspecified: Secondary | ICD-10-CM | POA: Diagnosis not present

## 2019-05-22 DIAGNOSIS — G309 Alzheimer's disease, unspecified: Secondary | ICD-10-CM | POA: Diagnosis not present

## 2019-05-29 DIAGNOSIS — R69 Illness, unspecified: Secondary | ICD-10-CM | POA: Diagnosis not present

## 2019-05-29 DIAGNOSIS — G301 Alzheimer's disease with late onset: Secondary | ICD-10-CM | POA: Diagnosis not present

## 2019-05-29 DIAGNOSIS — F411 Generalized anxiety disorder: Secondary | ICD-10-CM | POA: Diagnosis not present

## 2019-05-29 DIAGNOSIS — G309 Alzheimer's disease, unspecified: Secondary | ICD-10-CM | POA: Diagnosis not present

## 2019-06-04 DIAGNOSIS — G309 Alzheimer's disease, unspecified: Secondary | ICD-10-CM | POA: Diagnosis not present

## 2019-06-04 DIAGNOSIS — R2681 Unsteadiness on feet: Secondary | ICD-10-CM | POA: Diagnosis not present

## 2019-06-04 DIAGNOSIS — M6281 Muscle weakness (generalized): Secondary | ICD-10-CM | POA: Diagnosis not present

## 2019-06-05 DIAGNOSIS — R2681 Unsteadiness on feet: Secondary | ICD-10-CM | POA: Diagnosis not present

## 2019-06-05 DIAGNOSIS — G309 Alzheimer's disease, unspecified: Secondary | ICD-10-CM | POA: Diagnosis not present

## 2019-06-05 DIAGNOSIS — M6281 Muscle weakness (generalized): Secondary | ICD-10-CM | POA: Diagnosis not present

## 2019-06-06 DIAGNOSIS — R2681 Unsteadiness on feet: Secondary | ICD-10-CM | POA: Diagnosis not present

## 2019-06-06 DIAGNOSIS — M6281 Muscle weakness (generalized): Secondary | ICD-10-CM | POA: Diagnosis not present

## 2019-06-06 DIAGNOSIS — G309 Alzheimer's disease, unspecified: Secondary | ICD-10-CM | POA: Diagnosis not present

## 2019-06-07 DIAGNOSIS — Z20828 Contact with and (suspected) exposure to other viral communicable diseases: Secondary | ICD-10-CM | POA: Diagnosis not present

## 2019-06-09 DIAGNOSIS — Z20828 Contact with and (suspected) exposure to other viral communicable diseases: Secondary | ICD-10-CM | POA: Diagnosis not present

## 2019-06-10 DIAGNOSIS — R2681 Unsteadiness on feet: Secondary | ICD-10-CM | POA: Diagnosis not present

## 2019-06-10 DIAGNOSIS — G309 Alzheimer's disease, unspecified: Secondary | ICD-10-CM | POA: Diagnosis not present

## 2019-06-10 DIAGNOSIS — M6281 Muscle weakness (generalized): Secondary | ICD-10-CM | POA: Diagnosis not present

## 2019-06-11 DIAGNOSIS — R2681 Unsteadiness on feet: Secondary | ICD-10-CM | POA: Diagnosis not present

## 2019-06-11 DIAGNOSIS — M6281 Muscle weakness (generalized): Secondary | ICD-10-CM | POA: Diagnosis not present

## 2019-06-11 DIAGNOSIS — G309 Alzheimer's disease, unspecified: Secondary | ICD-10-CM | POA: Diagnosis not present

## 2019-06-12 DIAGNOSIS — Z20828 Contact with and (suspected) exposure to other viral communicable diseases: Secondary | ICD-10-CM | POA: Diagnosis not present

## 2019-06-16 DIAGNOSIS — Z20828 Contact with and (suspected) exposure to other viral communicable diseases: Secondary | ICD-10-CM | POA: Diagnosis not present

## 2019-06-16 DIAGNOSIS — J449 Chronic obstructive pulmonary disease, unspecified: Secondary | ICD-10-CM | POA: Diagnosis not present

## 2019-06-16 DIAGNOSIS — I10 Essential (primary) hypertension: Secondary | ICD-10-CM | POA: Diagnosis not present

## 2019-06-16 DIAGNOSIS — E559 Vitamin D deficiency, unspecified: Secondary | ICD-10-CM | POA: Diagnosis not present

## 2019-06-16 DIAGNOSIS — K219 Gastro-esophageal reflux disease without esophagitis: Secondary | ICD-10-CM | POA: Diagnosis not present

## 2019-06-20 DIAGNOSIS — Z20828 Contact with and (suspected) exposure to other viral communicable diseases: Secondary | ICD-10-CM | POA: Diagnosis not present

## 2019-06-24 DIAGNOSIS — Z20828 Contact with and (suspected) exposure to other viral communicable diseases: Secondary | ICD-10-CM | POA: Diagnosis not present

## 2019-06-29 DIAGNOSIS — Z20822 Contact with and (suspected) exposure to covid-19: Secondary | ICD-10-CM | POA: Diagnosis not present

## 2019-12-17 IMAGING — CT CT CERVICAL SPINE W/O CM
3 of 6 series · 13 of 33 positions shown, 15 images · non-contrast
Comparison: CT head 09/17/2013

CLINICAL DATA: Patient fell at 1488 hours. Hematoma to the right
forehead.

EXAM:
CT HEAD WITHOUT CONTRAST
CT CERVICAL SPINE WITHOUT CONTRAST
TECHNIQUE: Multidetector CT imaging of the head and cervical spine was
performed following the standard protocol without intravenous
contrast. Multiplanar CT image reconstructions of the cervical spine
were also generated.

[Series 5: coronal soft tissue · coronal · 0.29mm/px · 3 of 65 slices shown]
[im 17/65  bone]
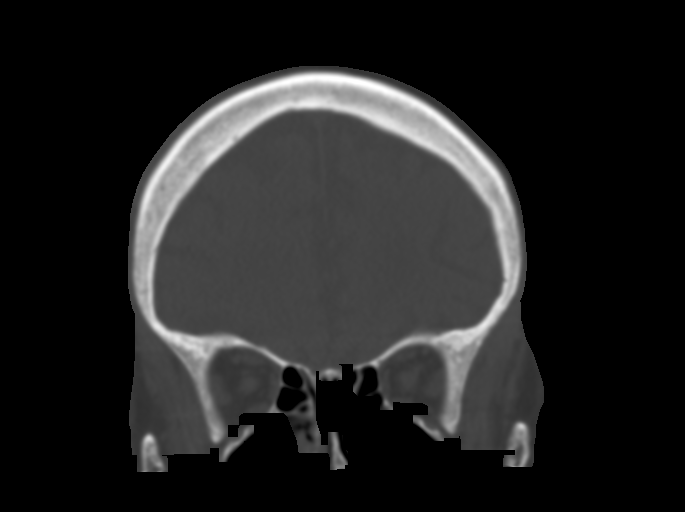
[im 33/65  bone]
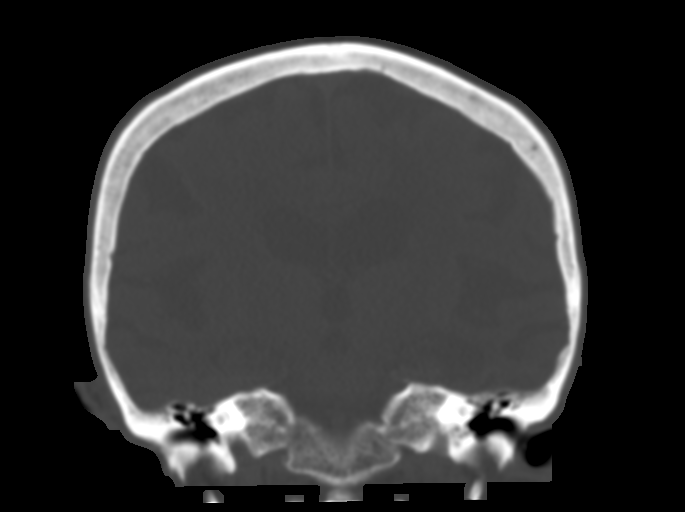
[im 49/65  bone]
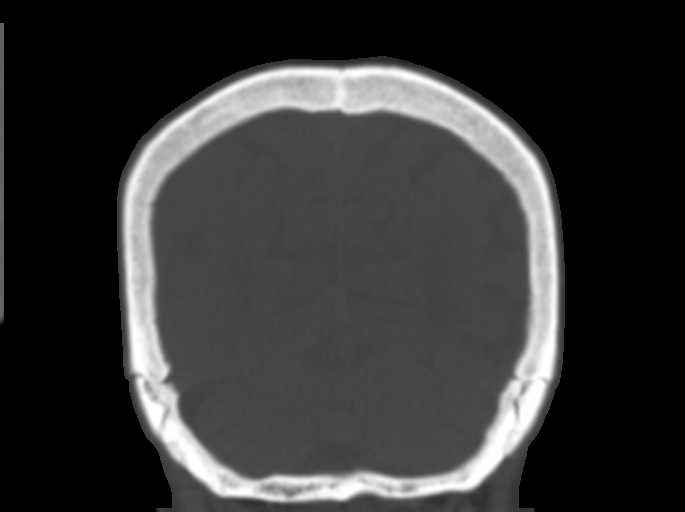

[Series 8: c spine soft · axial · 0.35mm/px · z∈[+1370,+1470]mm · 5 of 66 slices shown, 7 images]
[im 8/66  soft-tissue]
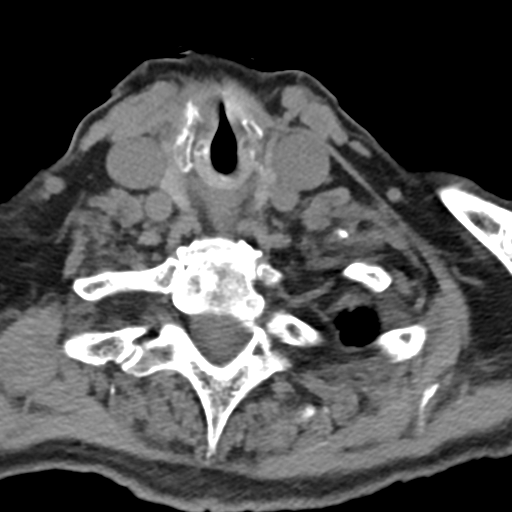
[im 8/66  bone]
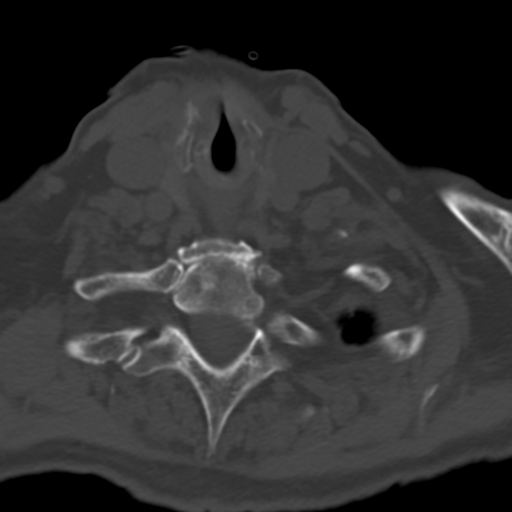
[im 22/66  bone]
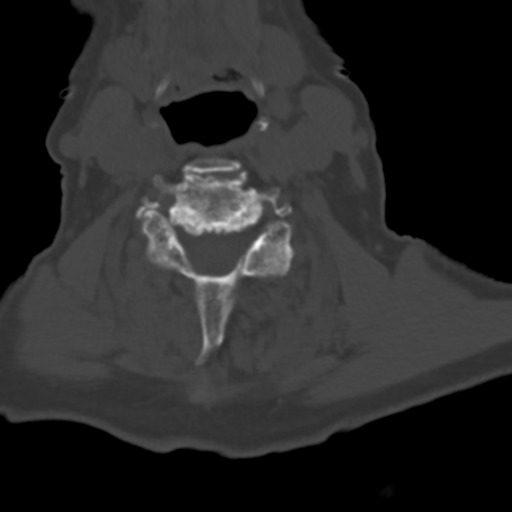
[im 37/66  bone]
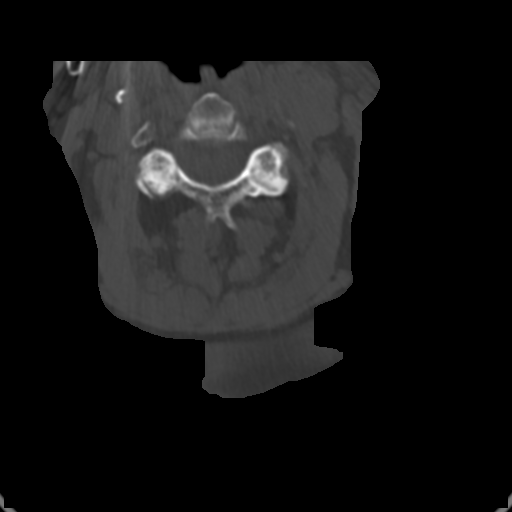
[im 44/66  bone]
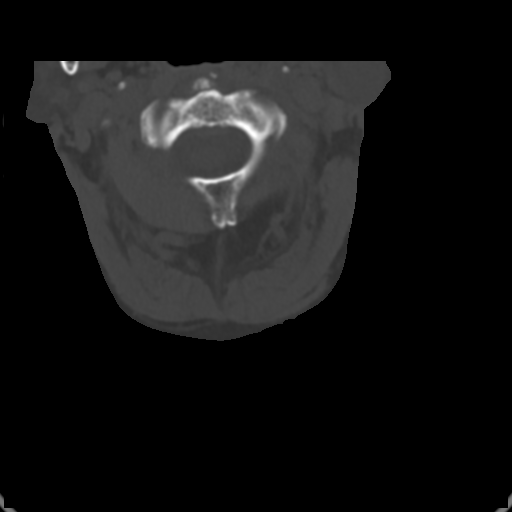
[im 58/66  soft-tissue]
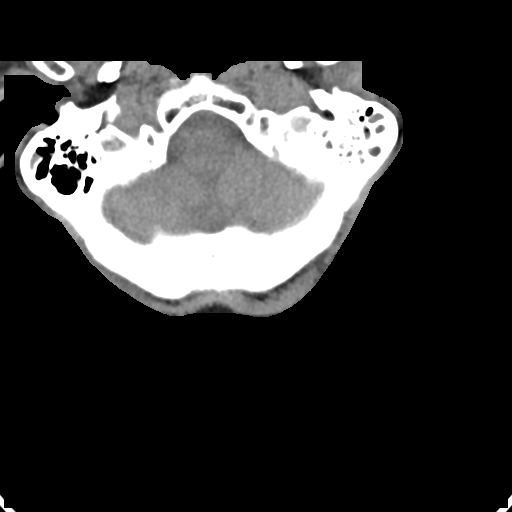
[im 58/66  bone]
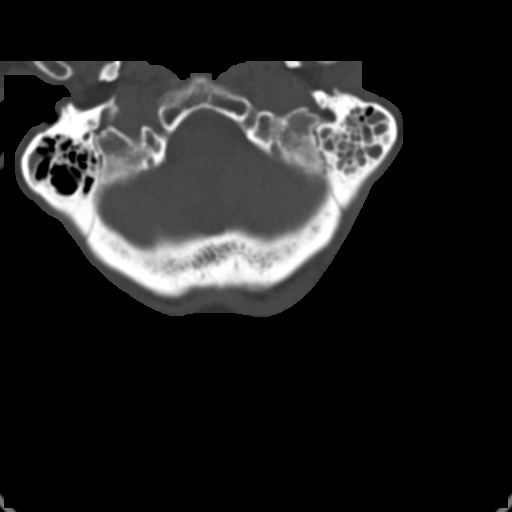

[Series 9: sagittal bone · sagittal · 0.23mm/px · 5 of 61 slices shown]
[im 11/61  bone]
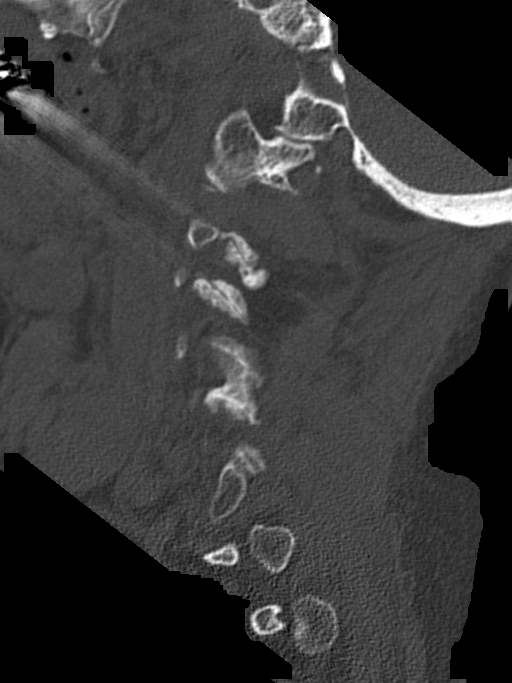
[im 21/61  bone]
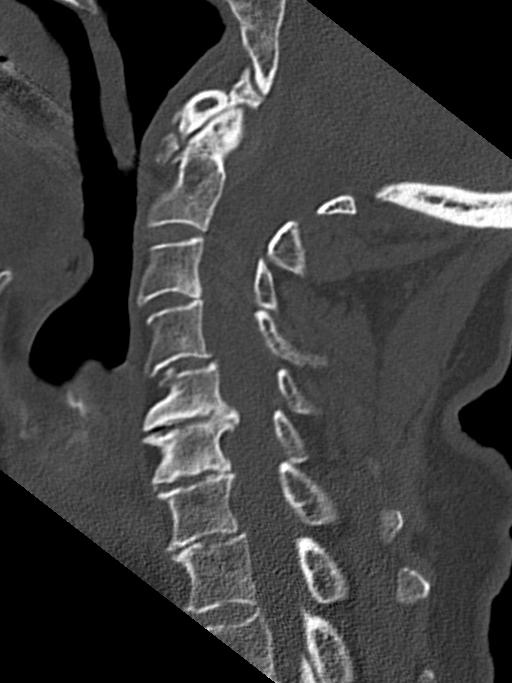
[im 31/61  bone]
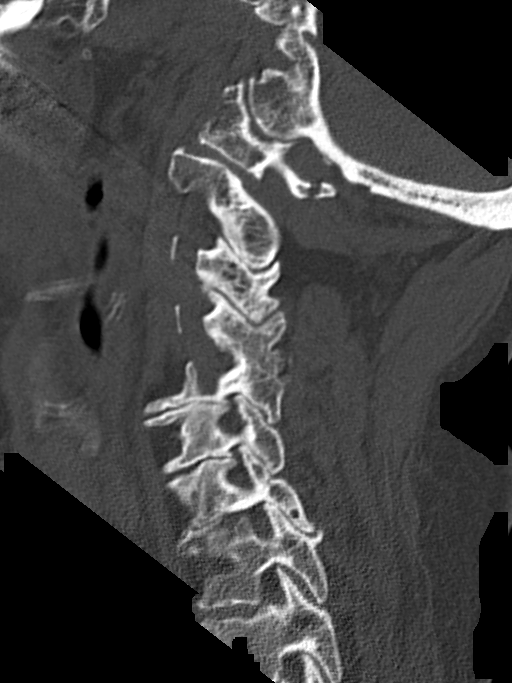
[im 41/61  bone]
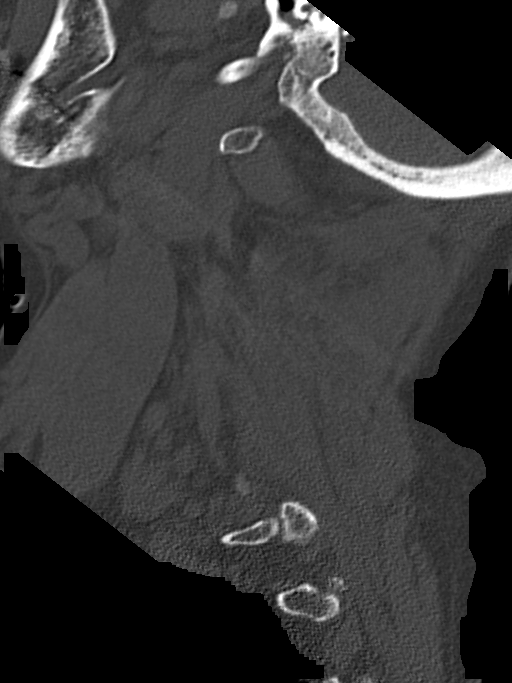
[im 51/61  bone]
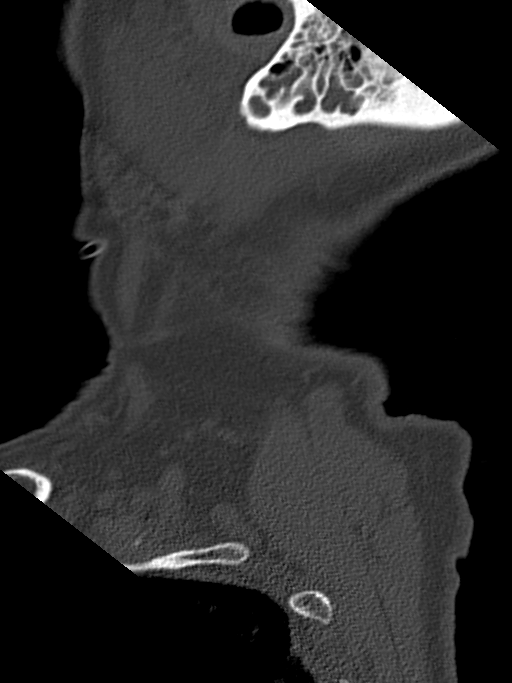

[13 of 33 positions shown; findings below may reference images not displayed]

FINDINGS: CT HEAD FINDINGS

Brain: Diffuse cerebral atrophy. Ventricular dilatation consistent
with central atrophy. Low-attenuation changes in the deep white
matter consistent small vessel ischemia. No mass-effect or midline
shift. No abnormal extra-axial fluid collections. Gray-white matter
junctions are distinct. Basal cisterns are not effaced. No acute
intracranial hemorrhage.

Vascular: No hyperdense vessel or unexpected calcification.

Skull: Normal. Negative for fracture or focal lesion.

Sinuses/Orbits: Mucosal thickening in the paranasal sinuses. No
acute air-fluid levels. Diffuse opacification of the left mastoid
air cells and partial opacification of right mastoid air cells.

Other: Subcutaneous scalp hematoma over the right anterior frontal
region.

CT CERVICAL SPINE FINDINGS

Alignment: Slight anterior subluxation of C4 on C5. Normal alignment
of the facet joints.

Skull base and vertebrae: Skull base appears intact. There are
mildly displaced fractures of the C1 vertebra involving the left
anterior ramus and bilateral posterior lamina. Articulation with the
occiput and C2 vertebra appear intact. Possible corner fracture of
the anterior superior C5 vertebral body. Consider MRI for further
evaluation at C4-5 to exclude fracture or ligamentous injury. No
additional fractures are identified.

Soft tissues and spinal canal: No prevertebral soft tissue swelling.
No paraspinal soft tissue mass or infiltration.

Disc levels: Degenerative changes throughout the cervical spine with
narrowed interspaces and endplate hypertrophic changes throughout.
Degenerative changes throughout the facet joints. Degenerative
changes at C1-2 resulting in loss of the anterior space.

Upper chest: Scarring in the lung apices.

Other: None.
IMPRESSION: 1. No acute intracranial abnormalities. Chronic atrophy and small
vessel ischemic changes. Subcutaneous scalp hematoma over the right
anterior frontal region.
2. Mildly displaced fractures of the C1 vertebra involving the left
anterior ramus and bilateral posterior lamina. This fracture
involves anterior and posterior column and represents a potentially
unstable fracture.
3. Possible corner fracture of the anterior superior C5 vertebral
body with slight anterior subluxation at C4-5. Consider MRI for
further evaluation at this time. No acute displaced fractures
identified in the cervical spine.
4. These results were called by telephone at the time of
interpretation on 12/12/2017 at [DATE] to Dr. BOEDIONO WAAS , who
verbally acknowledged these results.

## 2020-05-03 ENCOUNTER — Other Ambulatory Visit: Payer: Self-pay

## 2020-05-03 ENCOUNTER — Emergency Department (HOSPITAL_COMMUNITY): Payer: Medicare HMO

## 2020-05-03 ENCOUNTER — Emergency Department (HOSPITAL_COMMUNITY)
Admission: EM | Admit: 2020-05-03 | Discharge: 2020-05-03 | Disposition: A | Discharge status: Home / Self Care | Payer: Medicare HMO | Attending: Emergency Medicine | Admitting: Emergency Medicine

## 2020-05-03 ENCOUNTER — Encounter (HOSPITAL_COMMUNITY): Payer: Self-pay | Admitting: Emergency Medicine

## 2020-05-03 DIAGNOSIS — S99919A Unspecified injury of unspecified ankle, initial encounter: Secondary | ICD-10-CM | POA: Insufficient documentation

## 2020-05-03 DIAGNOSIS — Z79899 Other long term (current) drug therapy: Secondary | ICD-10-CM | POA: Diagnosis not present

## 2020-05-03 DIAGNOSIS — Z8501 Personal history of malignant neoplasm of esophagus: Secondary | ICD-10-CM | POA: Diagnosis not present

## 2020-05-03 DIAGNOSIS — S79919A Unspecified injury of unspecified hip, initial encounter: Secondary | ICD-10-CM | POA: Diagnosis not present

## 2020-05-03 DIAGNOSIS — W19XXXA Unspecified fall, initial encounter: Secondary | ICD-10-CM | POA: Insufficient documentation

## 2020-05-03 DIAGNOSIS — F0390 Unspecified dementia without behavioral disturbance: Secondary | ICD-10-CM | POA: Insufficient documentation

## 2020-05-03 DIAGNOSIS — J449 Chronic obstructive pulmonary disease, unspecified: Secondary | ICD-10-CM | POA: Diagnosis not present

## 2020-05-03 DIAGNOSIS — W010XXA Fall on same level from slipping, tripping and stumbling without subsequent striking against object, initial encounter: Secondary | ICD-10-CM | POA: Insufficient documentation

## 2020-05-03 DIAGNOSIS — Z87891 Personal history of nicotine dependence: Secondary | ICD-10-CM | POA: Insufficient documentation

## 2020-05-03 DIAGNOSIS — I10 Essential (primary) hypertension: Secondary | ICD-10-CM | POA: Insufficient documentation

## 2020-05-03 NOTE — ED Notes (Signed)
Bladder scan shows 142ml

## 2020-05-03 NOTE — ED Triage Notes (Signed)
[facility] - pt was found sitting on her the floor of her room this AM by staff. Pt has no complaints.

## 2020-05-03 NOTE — Discharge Instructions (Addendum)
No evidence of injury found on exam X-rays of ankles and hips negative Discussed with daughter

## 2020-05-03 NOTE — ED Notes (Signed)
Call to [facility], report given and discharge instructions reviewed with Abigail. EMS on the way for transportation back to [place].

## 2020-05-03 NOTE — ED Triage Notes (Signed)
Patient is from [facility]

## 2020-05-03 NOTE — ED Provider Notes (Signed)
[facility] EMERGENCY DEPARTMENT Provider Note   CSN: [id number] Arrival date & time: 05/03/20  0272     History Chief Complaint  Patient presents with  . [place] is a 86 y.o. female.  HPI Level 5 caveat secondary to dementia History obtained from chart, nursing notes obtained via EMS, phone call with daughter who gave me the majority of the history. 86 year old female history of dementia, esophageal cancer, COPD arrives from skilled nursing facility with reports that she was found on the ground.  No reports of obvious signs of trauma. Her daughter states there was concern for ankle injury and hip injury.  She thought that it was on the right.  She reports her mother has had previous joint surgery.  She reports her mother is fairly demented at baseline.  She thinks that she may have attempted to get up but has not certain what occurred.  There is no report from the nursing home that she was found on the ground.  Past Medical History:  Diagnosis Date  . Adenomatous colon polyp 12/1993  . Allergy    lipitor  . Anxiety   . COPD (chronic obstructive pulmonary disease) ([place])   . Dementia ([place])   . Diverticulosis   . Dysphagia    to solids and pills .  On left side  . Esophageal cancer ([place]) 05/15/12   bx=, middle,Invasive squamous cell ca,  . Hot flashes   . Hyperlipidemia   . Hypertension   . Mitral valve insufficiency   . Osteoporosis     Patient Active Problem List   Diagnosis Date Noted  . Closed fracture of right wrist 10/01/18 12/05/2018  . S/P ORIF (open reduction internal fixation) fracture   . Closed fracture of subtrochanteric section of femur, right, initial encounter ([place]) 09/13/2016  . History of esophageal cancer 09/13/2016  . Dementia ([place]) 09/13/2016  . Carcinoma of middle third of esophagus ([place]) 05/24/2012  . Dysphagia   . Esophageal cancer ([place]) 05/15/2012    Past Surgical History:  Procedure Laterality Date  . ABDOMINAL  HYSTERECTOMY  5366,4403   salpingo-opherctomy 1986  . ANTERIOR AND POSTERIOR VAGINAL REPAIR W/ SACROSPINOUS LIGAMENT SUSPENSION    . Biopsy of Esophagus  05/15/12   Invasive Squamous Cell Carcinoma  . colon polyps     hx adenomatous  . COLONOSCOPY  2008   neg  . EUS  06/03/2012   Procedure: UPPER ENDOSCOPIC ULTRASOUND (EUS) LINEAR;  Surgeon: Milus Banister, MD;  Location: [facility];  Service: Endoscopy;  Laterality: N/A;  +/- radial and liner   . EXPLORATORY LAPAROTOMY  1985  . FEMUR IM NAIL Right 09/14/2016   Procedure: INTRAMEDULLARY (IM) NAIL FEMORAL;  Surgeon: Marchia Bond, MD;  Location: [facility];  Service: Orthopedics;  Laterality: Right;  . orif left elbow  05/13/08   s/p fall, olecranon fracture  . TONSILLECTOMY    . TUBAL LIGATION       OB History   No obstetric history on file.     Family History  Problem Relation Age of Onset  . CVA Mother   . Lung cancer Mother   . Alzheimer's disease Mother   . Cancer Mother   . Kidney failure Father   . Colon polyps Daughter     Social History   Tobacco Use  . Smoking status: Former Smoker    Packs/day: 0.50    Years: 30.00    Pack years: 15.00    Types: Cigarettes  Quit date: 2018    Years since quitting: 3.8  . Smokeless tobacco: Never Used  Substance Use Topics  . Alcohol use: No  . Drug use: No    Comment: 30+years smoking<1ppd    Home Medications Prior to Admission medications   Medication Sig Start Date End Date Taking? Authorizing Provider  acetaminophen (TYLENOL) 325 MG tablet Take 2 tablets (650 mg total) by mouth every 6 (six) hours as needed. 09/14/16   Marchia Bond, MD  Calcium Citrate (CITRACAL PO) Take 1 tablet by mouth daily.    [provider]  Cholecalciferol (VITAMIN D-3) 1000 UNITS CAPS Take 1,000 Units by mouth daily.     [provider]  citalopram (CELEXA) 10 MG tablet  10/07/18   [provider]  clonazePAM (KLONOPIN) 0.5 MG tablet Take 1 tablet (0.5 mg total)  by mouth 2 (two) times daily as needed for anxiety (anxiety/agitation). 09/18/16   Domenic Polite, MD  divalproex (DEPAKOTE) 250 MG DR tablet  10/16/18   [provider]  enoxaparin (LOVENOX) 30 MG/0.3ML injection Inject 0.3 mLs (30 mg total) into the skin daily. 09/15/16   Marchia Bond, MD  esomeprazole ([place]) 40 MG capsule  10/06/18   [provider]  Memantine HCl-Donepezil HCl (NAMZARIC) 28-10 MG CP24 Take 1 capsule by mouth daily.    [provider]  metoprolol succinate (TOPROL-XL) 25 MG 24 hr tablet Take 25 mg by mouth daily with lunch.     [provider]  risperiDONE (RISPERDAL) 0.25 MG tablet Take 0.25 mg by mouth daily as needed (for aggitation).  07/27/16   [provider]  traMADol Veatrice Bourbon) 50 MG tablet  10/03/18   [provider]    Allergies    Codeine and Lipitor [atorvastatin]  Review of Systems   Review of Systems  Unable to perform ROS: Dementia    Physical Exam Updated Vital Signs BP (!) 173/62   Pulse (!) 56   Temp 98.3 F (36.8 C)   Resp 18   Ht 1.6 m (5\' 3" )   Wt 63 kg   SpO2 94%   BMI 24.60 kg/m   Physical Exam Vitals and nursing note reviewed.  Constitutional:      General: She is not in acute distress.    Appearance: Normal appearance.  HENT:     Head: Normocephalic.     Right Ear: External ear normal.     Left Ear: External ear normal.     Nose: Nose normal.     Mouth/Throat:     Pharynx: Oropharynx is clear.  Eyes:     Pupils: Pupils are equal, round, and reactive to light.  Cardiovascular:     Rate and Rhythm: Normal rate and regular rhythm.     Pulses: Normal pulses.  Pulmonary:     Effort: Pulmonary effort is normal.     Breath sounds: Normal breath sounds.  Abdominal:     General: Abdomen is flat.     Palpations: Abdomen is soft.  Musculoskeletal:        General: Normal range of motion.     Cervical back: Normal range of motion.     Comments: Cervical, thoracic, and lumbar spine  visualized and palpated without any evidence of trauma or abnormality Bilateral upper extremities visualized without any signs of trauma or bruising or tenderness to palpation Bilateral lower extremities, buttocks, and hips visualized without any obvious signs of trauma bruising or tenderness to palpation  Skin:    General: Skin is  warm and dry.  Neurological:     General: No focal deficit present.     Mental Status: She is alert.     Cranial Nerves: No cranial nerve deficit.     Motor: No weakness.     Comments: Gait not tested Patient does not verbalize with me at all     ED Results / Procedures / Treatments   Labs (all labs ordered are listed, but only abnormal results are displayed) Labs Reviewed - No data to display  EKG None  Radiology No results found.  Procedures Procedures (including critical care time)  Medications Ordered in ED Medications - No data to display  ED Course  I have reviewed the triage vital signs and the nursing notes.  Pertinent labs & imaging results that were available during my care of the patient were reviewed by me and considered in my medical decision making (see chart for details).    MDM Rules/Calculators/A&P                          Based on discussion with daughter concern for ankle injury and hip injury, hip and ankle x-rays will be obtained here.  If these are normal, plan return to her skilled nursing facility.  I have discussed this plan with the daughter and will call her if there are abnormalities of the x-rays.  Otherwise, she will be discharged back to her facility.  Her daughter is in agreement with this plan. Final Clinical Impression(s) / ED Diagnoses Final diagnoses:  None    Rx / DC Orders ED Discharge Orders    None       Pattricia Boss, MD 05/04/20 1235

## 2020-07-18 ENCOUNTER — Other Ambulatory Visit: Payer: Self-pay

## 2020-07-18 ENCOUNTER — Emergency Department (HOSPITAL_COMMUNITY): Payer: Medicare HMO

## 2020-07-18 ENCOUNTER — Encounter (HOSPITAL_COMMUNITY): Payer: Self-pay

## 2020-07-18 ENCOUNTER — Emergency Department (HOSPITAL_COMMUNITY)
Admission: EM | Admit: 2020-07-18 | Discharge: 2020-07-18 | Disposition: A | Discharge status: Home / Self Care | Payer: Medicare HMO | Attending: Emergency Medicine | Admitting: Emergency Medicine

## 2020-07-18 DIAGNOSIS — F0390 Unspecified dementia without behavioral disturbance: Secondary | ICD-10-CM | POA: Diagnosis not present

## 2020-07-18 DIAGNOSIS — J449 Chronic obstructive pulmonary disease, unspecified: Secondary | ICD-10-CM | POA: Diagnosis not present

## 2020-07-18 DIAGNOSIS — R4182 Altered mental status, unspecified: Secondary | ICD-10-CM | POA: Diagnosis present

## 2020-07-18 DIAGNOSIS — Z7901 Long term (current) use of anticoagulants: Secondary | ICD-10-CM | POA: Diagnosis not present

## 2020-07-18 DIAGNOSIS — Z87891 Personal history of nicotine dependence: Secondary | ICD-10-CM | POA: Insufficient documentation

## 2020-07-18 DIAGNOSIS — Z79899 Other long term (current) drug therapy: Secondary | ICD-10-CM | POA: Diagnosis not present

## 2020-07-18 DIAGNOSIS — B9620 Unspecified Escherichia coli [E. coli] as the cause of diseases classified elsewhere: Secondary | ICD-10-CM | POA: Insufficient documentation

## 2020-07-18 DIAGNOSIS — R404 Transient alteration of awareness: Secondary | ICD-10-CM

## 2020-07-18 DIAGNOSIS — Z8501 Personal history of malignant neoplasm of esophagus: Secondary | ICD-10-CM | POA: Insufficient documentation

## 2020-07-18 DIAGNOSIS — I10 Essential (primary) hypertension: Secondary | ICD-10-CM | POA: Insufficient documentation

## 2020-07-18 HISTORY — DX: Aphasia: R47.01

## 2020-07-18 HISTORY — DX: Paralytic gait: R26.1

## 2020-07-18 HISTORY — DX: Cognitive communication deficit: R41.841

## 2020-07-18 HISTORY — DX: Dementia in other diseases classified elsewhere, unspecified severity, without behavioral disturbance, psychotic disturbance, mood disturbance, and anxiety: F02.80

## 2020-07-18 HISTORY — DX: Unsteadiness on feet: R26.81

## 2020-07-18 LAB — CBC WITH DIFFERENTIAL/PLATELET
Abs Immature Granulocytes: 0.03 10*3/uL (ref 0.00–0.07)
Basophils Absolute: 0.1 10*3/uL (ref 0.0–0.1)
Basophils Relative: 1 %
Eosinophils Absolute: 0 10*3/uL (ref 0.0–0.5)
Eosinophils Relative: 0 %
HCT: 46.1 % — ABNORMAL HIGH (ref 36.0–46.0)
Hemoglobin: 14.8 g/dL (ref 12.0–15.0)
Immature Granulocytes: 0 %
Lymphocytes Relative: 9 %
Lymphs Abs: 0.8 10*3/uL (ref 0.7–4.0)
MCH: 31.9 pg (ref 26.0–34.0)
MCHC: 32.1 g/dL (ref 30.0–36.0)
MCV: 99.4 fL (ref 80.0–100.0)
Monocytes Absolute: 0.4 10*3/uL (ref 0.1–1.0)
Monocytes Relative: 5 %
Neutro Abs: 7.9 10*3/uL — ABNORMAL HIGH (ref 1.7–7.7)
Neutrophils Relative %: 85 %
Platelets: 225 10*3/uL (ref 150–400)
RBC: 4.64 MIL/uL (ref 3.87–5.11)
RDW: 14.2 % (ref 11.5–15.5)
WBC: 9.3 10*3/uL (ref 4.0–10.5)
nRBC: 0 % (ref 0.0–0.2)

## 2020-07-18 LAB — URINALYSIS, ROUTINE W REFLEX MICROSCOPIC
Bilirubin Urine: NEGATIVE
Glucose, UA: NEGATIVE mg/dL
Ketones, ur: NEGATIVE mg/dL
Leukocytes,Ua: NEGATIVE
Nitrite: NEGATIVE
Protein, ur: 30 mg/dL — AB
Specific Gravity, Urine: 1.013 (ref 1.005–1.030)
pH: 6 (ref 5.0–8.0)

## 2020-07-18 LAB — COMPREHENSIVE METABOLIC PANEL
ALT: 26 U/L (ref 0–44)
AST: 12 U/L — ABNORMAL LOW (ref 15–41)
Albumin: 3.6 g/dL (ref 3.5–5.0)
Alkaline Phosphatase: 61 U/L (ref 38–126)
Anion gap: 8 (ref 5–15)
BUN: 23 mg/dL (ref 8–23)
CO2: 27 mmol/L (ref 22–32)
Calcium: 9 mg/dL (ref 8.9–10.3)
Chloride: 99 mmol/L (ref 98–111)
Creatinine, Ser: 1.09 mg/dL — ABNORMAL HIGH (ref 0.44–1.00)
GFR, Estimated: 49 mL/min — ABNORMAL LOW (ref >60)
Glucose, Bld: 108 mg/dL — ABNORMAL HIGH (ref 70–99)
Potassium: 4.3 mmol/L (ref 3.5–5.1)
Sodium: 134 mmol/L — ABNORMAL LOW (ref 135–145)
Total Bilirubin: 0.5 mg/dL (ref 0.3–1.2)
Total Protein: 7.4 g/dL (ref 6.5–8.1)

## 2020-07-18 LAB — CBG MONITORING, ED: Glucose-Capillary: 99 mg/dL (ref 70–99)

## 2020-07-18 LAB — TROPONIN I (HIGH SENSITIVITY): Troponin I (High Sensitivity): 7 ng/L (ref <18)

## 2020-07-18 MED ORDER — SODIUM CHLORIDE 0.9 % IV BOLUS
500.0000 mL | Freq: Once | INTRAVENOUS | Status: AC
  Administered 2020-07-18: 500 mL via INTRAVENOUS

## 2020-07-18 MED ORDER — CEPHALEXIN 500 MG PO CAPS: 500.0000 mg | ORAL_CAPSULE | Freq: Two times a day (BID) | ORAL | 0 refills | Status: AC

## 2020-07-18 NOTE — ED Triage Notes (Signed)
Pt brought to ED via [facility] for altered mental status from [place]. Per staff, came in at lunch was normal then at 1300, pt was unresponsive, had episode of apnea, BP 70/40, 93% on RA, cbg 135, pinpoint pupils

## 2020-07-18 NOTE — ED Notes (Signed)
Report given to [facility]  

## 2020-07-18 NOTE — ED Provider Notes (Signed)
[facility] EMERGENCY DEPARTMENT Provider Note   CSN: [id number] Arrival date & time: 07/18/20  1322     History Chief Complaint  Patient presents with  . Altered Mental Status    [place] is a 87 y.o. female with friend past medical history of COPD not on oxygen, hyperlipidemia, hypertension, severe dementia that presents the emergency department today for altered mental status from [place].  Patient has severe dementia, level 5 caveat.  History provided by EMS, nursing home did not answer.  States that patient was in her normal health around 12:00 when she was last seen, when they went to go check on her around 1 she was not responding to staff, had a couple seconds of apnea according to staff at which time they took her blood pressure which was 70/40.  Other vital signs at that time were 93% on room air.  Patient was able to wake up by the time EMS arrived which was a couple seconds later, EMS states that they were able to sternal rub patient and patient was able to respond normally.  EMS states that patient was swinging at EMS with both hands, at that time they did not notice any facial droop, slurred speech.  Patient does have speech impediment.  Patient is not on any blood thinners.  At this time blood pressure is 121/48, patient is responding to direct pressure, satting at 94% on room air. NO other abnormalities according to EMS.   HPI     Past Medical History:  Diagnosis Date  . Adenomatous colon polyp 12/1993  . Allergy    lipitor  . Alzheimer disease ([place])   . Anxiety   . Aphasia   . Cognitive communication deficit   . COPD (chronic obstructive pulmonary disease) ([place])   . Dementia ([place])   . Diverticulosis   . Dysphagia    to solids and pills .  On left side  . Esophageal cancer ([place]) 05/15/12   bx=, middle,Invasive squamous cell ca,  . Hot flashes   . Hyperlipidemia   . Hypertension   . Mitral valve insufficiency   . Osteoporosis   . Paralytic gait   .  Unsteadiness on feet     Patient Active Problem List   Diagnosis Date Noted  . Closed fracture of right wrist 10/01/18 12/05/2018  . S/P ORIF (open reduction internal fixation) fracture   . Closed fracture of subtrochanteric section of femur, right, initial encounter ([place]) 09/13/2016  . History of esophageal cancer 09/13/2016  . Dementia ([place]) 09/13/2016  . Carcinoma of middle third of esophagus ([place]) 05/24/2012  . Dysphagia   . Esophageal cancer ([place]) 05/15/2012    Past Surgical History:  Procedure Laterality Date  . ABDOMINAL HYSTERECTOMY  3220,2542   salpingo-opherctomy 1986  . ANTERIOR AND POSTERIOR VAGINAL REPAIR W/ SACROSPINOUS LIGAMENT SUSPENSION    . Biopsy of Esophagus  05/15/12   Invasive Squamous Cell Carcinoma  . colon polyps     hx adenomatous  . COLONOSCOPY  2008   neg  . EUS  06/03/2012   Procedure: UPPER ENDOSCOPIC ULTRASOUND (EUS) LINEAR;  Surgeon: Milus Banister, MD;  Location: [facility];  Service: Endoscopy;  Laterality: N/A;  +/- radial and liner   . EXPLORATORY LAPAROTOMY  1985  . FEMUR IM NAIL Right 09/14/2016   Procedure: INTRAMEDULLARY (IM) NAIL FEMORAL;  Surgeon: Marchia Bond, MD;  Location: [facility];  Service: Orthopedics;  Laterality: Right;  . orif left elbow  05/13/08   s/p fall,  olecranon fracture  . TONSILLECTOMY    . TUBAL LIGATION       OB History   No obstetric history on file.     Family History  Problem Relation Age of Onset  . CVA Mother   . Lung cancer Mother   . Alzheimer's disease Mother   . Cancer Mother   . Kidney failure Father   . Colon polyps Daughter     Social History   Tobacco Use  . Smoking status: Former Smoker    Packs/day: 0.50    Years: 30.00    Pack years: 15.00    Types: Cigarettes    Quit date: 2018    Years since quitting: 4.1  . Smokeless tobacco: Never Used  Substance Use Topics  . Alcohol use: No  . Drug use: No    Comment: 30+years smoking<1ppd    Home Medications Prior to Admission  medications   Medication Sig Start Date End Date Taking? Authorizing Provider  cephALEXin (KEFLEX) 500 MG capsule Take 1 capsule (500 mg total) by mouth 2 (two) times daily for 3 days. 07/18/20 07/21/20 Yes Farrel Gordon, PA-C  acetaminophen (TYLENOL) 325 MG tablet Take 2 tablets (650 mg total) by mouth every 6 (six) hours as needed. 09/14/16   Teryl Lucy, MD  Calcium Citrate (CITRACAL PO) Take 1 tablet by mouth daily.    [provider]  Cholecalciferol (VITAMIN D-3) 1000 UNITS CAPS Take 1,000 Units by mouth daily.     [provider]  citalopram (CELEXA) 10 MG tablet  10/07/18   [provider]  clonazePAM (KLONOPIN) 0.5 MG tablet Take 1 tablet (0.5 mg total) by mouth 2 (two) times daily as needed for anxiety (anxiety/agitation). 09/18/16   Zannie Cove, MD  divalproex (DEPAKOTE) 250 MG DR tablet  10/16/18   [provider]  enoxaparin (LOVENOX) 30 MG/0.3ML injection Inject 0.3 mLs (30 mg total) into the skin daily. 09/15/16   Teryl Lucy, MD  esomeprazole (NEXIUM) 40 MG capsule  10/06/18   [provider]  Memantine HCl-Donepezil HCl (NAMZARIC) 28-10 MG CP24 Take 1 capsule by mouth daily.    [provider]  metoprolol succinate (TOPROL-XL) 25 MG 24 hr tablet Take 25 mg by mouth daily with lunch.     [provider]  risperiDONE (RISPERDAL) 0.25 MG tablet Take 0.25 mg by mouth daily as needed (for aggitation).  07/27/16   [provider]  traMADol Janean Sark) 50 MG tablet  10/03/18   [provider]    Allergies    Codeine and Lipitor [atorvastatin]  Review of Systems   Review of Systems  Unable to perform ROS: Dementia    Physical Exam Updated Vital Signs BP (!) 149/127   Pulse (!) 56   Temp 98.1 F (36.7 C) (Rectal)   Resp 16   SpO2 95%   Physical Exam Constitutional:      General: She is not in acute distress.    Appearance: Normal appearance. She is not ill-appearing, toxic-appearing or diaphoretic.      Comments: Patient is mainly nonverbal, does not follow commands most likely due to severe dementia.  Is combative with both upper extremities, however is easily redirectable.  HENT:     Head: Normocephalic and atraumatic.     Mouth/Throat:     Mouth: Mucous membranes are moist.     Pharynx: Oropharynx is clear.  Eyes:     General: No scleral icterus.    Extraocular Movements: Extraocular movements intact.  Pupils: Pupils are equal, round, and reactive to light.     Comments: Pinpoint pupils, are reactive to light.  Cardiovascular:     Rate and Rhythm: Normal rate and regular rhythm.     Pulses: Normal pulses.     Heart sounds: Normal heart sounds.  Pulmonary:     Effort: Pulmonary effort is normal. No respiratory distress.     Breath sounds: Normal breath sounds. No stridor. No wheezing, rhonchi or rales.  Chest:     Chest wall: No tenderness.  Abdominal:     General: Abdomen is flat. There is no distension.     Palpations: Abdomen is soft.     Tenderness: There is no abdominal tenderness. There is no guarding or rebound.  Musculoskeletal:        General: No swelling or tenderness. Normal range of motion.     Cervical back: Normal range of motion and neck supple. No rigidity.     Right lower leg: No edema.     Left lower leg: No edema.  Skin:    General: Skin is warm and dry.     Capillary Refill: Capillary refill takes less than 2 seconds.     Coloration: Skin is not pale.  Neurological:     General: No focal deficit present.     Mental Status: She is alert and oriented to person, place, and time.     Comments: Moving all 4 extremities.  Patient sternal rub, she is combative and will start mumbling.  Psychiatric:        Mood and Affect: Mood normal.        Behavior: Behavior normal.     ED Results / Procedures / Treatments   Labs (all labs ordered are listed, but only abnormal results are displayed) Labs Reviewed  CBC WITH DIFFERENTIAL/PLATELET - Abnormal; Notable  for the following components:      Result Value   HCT 46.1 (*)    Neutro Abs 7.9 (*)    All other components within normal limits  COMPREHENSIVE METABOLIC PANEL - Abnormal; Notable for the following components:   Sodium 134 (*)    Glucose, Bld 108 (*)    Creatinine, Ser 1.09 (*)    AST 12 (*)    GFR, Estimated 49 (*)    All other components within normal limits  URINALYSIS, ROUTINE W REFLEX MICROSCOPIC - Abnormal; Notable for the following components:   Color, Urine AMBER (*)    APPearance CLOUDY (*)    Hgb urine dipstick SMALL (*)    Protein, ur 30 (*)    Bacteria, UA MANY (*)    All other components within normal limits  URINE CULTURE  CBG MONITORING, ED  TROPONIN I (HIGH SENSITIVITY)    EKG EKG Interpretation  Date/Time:  Sunday July 18 2020 13:53:40 EST Ventricular Rate:  57 PR Interval:    QRS Duration: 83 QT Interval:  507 QTC Calculation: 494 R Axis:   0 Text Interpretation: Sinus rhythm Posterior infarct, old No significant change since last tracing Confirmed by Fredia Sorrow [id number]) on 07/18/2020 3:04:54 PM   Radiology DG Chest 2 View  Result Date: 07/18/2020 CLINICAL DATA:  Syncope. EXAM: CHEST - 2 VIEW COMPARISON:  December 12, 2017. FINDINGS: Stable cardiomegaly. No pneumothorax or pleural effusion is noted. Both lungs are clear. The visualized skeletal structures are unremarkable. IMPRESSION: No active cardiopulmonary disease. Aortic Atherosclerosis (ICD10-I70.0). Electronically Signed   By: Marijo Conception M.D.   On: 07/18/2020 14:32  CT HEAD WO CONTRAST  Result Date: 07/18/2020 CLINICAL DATA:  Syncopal episode. EXAM: CT HEAD WITHOUT CONTRAST TECHNIQUE: Contiguous axial images were obtained from the base of the skull through the vertex without intravenous contrast. COMPARISON:  None. FINDINGS: Brain: No evidence of acute infarction, hemorrhage, hydrocephalus, extra-axial collection or mass lesion/mass effect. Advanced brain parenchymal volume loss and deep  white matter microangiopathy. Encephalomalacia in the left temporal lobe with chronic appearance and secondary dilation of the lateral ventricle. Vascular: No hyperdense vessel or unexpected calcification. Skull: Normal. Negative for fracture or focal lesion. Sinuses/Orbits: No acute finding. Other: None. IMPRESSION: 1. No acute intracranial abnormality. 2. Advanced brain parenchymal atrophy and chronic microvascular disease. 3. Encephalomalacia in the left temporal lobe with chronic appearance. Electronically Signed   By: Fidela Salisbury M.D.   On: 07/18/2020 14:31    Procedures Procedures   Medications Ordered in ED Medications  sodium chloride 0.9 % bolus 500 mL (500 mLs Intravenous New Bag/Given 07/18/20 1616)    ED Course  I have reviewed the triage vital signs and the nursing notes.  Pertinent labs & imaging results that were available during my care of the patient were reviewed by me and considered in my medical decision making (see chart for details).    MDM Rules/Calculators/A&P                         [place] is a 87 y.o. female with friend past medical history of COPD not on oxygen, hyperlipidemia, hypertension, severe dementia that presents the emergency department today for altered mental status from [place]. [facility] did not answer, however story obtained by EMS, questionable unresponsiveness with brief apnea. Will obtain basic work-up and head CT. No focal neuro deficits noted. Patient is DNR.  Work-up today with CBC and CMP unremarkable. Troponin 7, head CT without any acute intracranial abnormality. Chest x-ray interpreted me without any acute cardiopulmonary disease, EKG interpreted me without any new signs of ischemia. Negative orthostatics.  Urinalysis with questionable UTI, will obtain urine culture.  Will also start on Keflex for couple days.   On repeat exam patient is awake and appears back to baseline.  Patient will follow up with PCP, strict return  precautions given to the patient's daughter who is agreeable with discharge at this time.  Fall prevention discussed, patient to avoid sedation medications.  Unsure what exactly happened today, unsure if there was actual unresponsiveness.  However with blood pressure of 70 at that time, this could explain patient's symptoms at that time, patient's blood pressure has been normal here, [id number] systolic, patient has been observed for 3 hours with no abnormalities seen.  Doubt need for further emergent work up at this time. I explained the diagnosis and have given explicit precautions to return to the ER including for any other new or worsening symptoms. The patient understands and accepts the medical plan as it's been dictated and I have answered their questions. Discharge instructions concerning home care and prescriptions have been given. The patient is STABLE and is discharged to home in good condition.  I discussed this case with my attending physician who cosigned this note including patient's presenting symptoms, physical exam, and planned diagnostics and interventions. Attending physician stated agreement with plan or made changes to plan which were implemented.   Attending physician assessed patient at bedside.   Final Clinical Impression(s) / ED Diagnoses Final diagnoses:  Transient alteration of awareness    Rx / DC Orders ED  Discharge Orders         Ordered    Urine Culture  Status:  Canceled        07/18/20 1614    cephALEXin (KEFLEX) 500 MG capsule  2 times daily        07/18/20 1618           Alfredia Client, PA-C 07/18/20 1634    Fredia Sorrow, MD 07/23/20 0930

## 2020-07-18 NOTE — Discharge Instructions (Addendum)
Take the Keflex as directed for questionable urinary tract infection.  Avoid sedating medications or opioids, please follow-up with primary care in the next couple of days.  Please use instructions for fall precautions.  If there is any new worsening concerning symptoms like back to the emergency department.

## 2020-07-21 LAB — URINE CULTURE: Culture: >=100000 — AB

## 2020-07-31 ENCOUNTER — Encounter (HOSPITAL_COMMUNITY): Payer: Self-pay | Admitting: Emergency Medicine

## 2020-07-31 ENCOUNTER — Emergency Department (HOSPITAL_COMMUNITY)
Admission: EM | Admit: 2020-07-31 | Discharge: 2020-08-01 | Disposition: A | Discharge status: Home / Self Care | Payer: Medicare HMO | Attending: Emergency Medicine | Admitting: Emergency Medicine

## 2020-07-31 ENCOUNTER — Emergency Department (HOSPITAL_COMMUNITY): Payer: Medicare HMO

## 2020-07-31 ENCOUNTER — Other Ambulatory Visit: Payer: Self-pay

## 2020-07-31 DIAGNOSIS — I10 Essential (primary) hypertension: Secondary | ICD-10-CM | POA: Insufficient documentation

## 2020-07-31 DIAGNOSIS — Z8501 Personal history of malignant neoplasm of esophagus: Secondary | ICD-10-CM | POA: Insufficient documentation

## 2020-07-31 DIAGNOSIS — Z87891 Personal history of nicotine dependence: Secondary | ICD-10-CM | POA: Insufficient documentation

## 2020-07-31 DIAGNOSIS — F0390 Unspecified dementia without behavioral disturbance: Secondary | ICD-10-CM | POA: Diagnosis not present

## 2020-07-31 DIAGNOSIS — W19XXXA Unspecified fall, initial encounter: Secondary | ICD-10-CM | POA: Insufficient documentation

## 2020-07-31 DIAGNOSIS — Z79899 Other long term (current) drug therapy: Secondary | ICD-10-CM | POA: Diagnosis not present

## 2020-07-31 DIAGNOSIS — S0990XA Unspecified injury of head, initial encounter: Secondary | ICD-10-CM | POA: Insufficient documentation

## 2020-07-31 DIAGNOSIS — J449 Chronic obstructive pulmonary disease, unspecified: Secondary | ICD-10-CM | POA: Diagnosis not present

## 2020-07-31 NOTE — ED Provider Notes (Signed)
[facility] EMERGENCY DEPARTMENT Provider Note   CSN: [id number] Arrival date & time: 07/31/20  1959     History Chief Complaint  Patient presents with   Jamie [name] is a 87 y.o. female w/ dementia presenting from a nursing facility with reported fall and head injury.  Patient is not on A/C.  She cannot provide additional hx given her dementia.  She denies any acute complaints including HA, neck pain, hip pain, or chest pain when I ask her directly.  Seen on 07/18/20 for AMS/confusion, had metabolic workup including CTH at the time which showed no acute stroke.  Likely UTI - discharged on antibiotics (keflex).  Urine cx grew E Coli that is pan sensitive.  HPI     Past Medical History:  Diagnosis Date   Adenomatous colon polyp 12/1993   Allergy    lipitor   Alzheimer disease ([place])    Anxiety    Aphasia    Cognitive communication deficit    COPD (chronic obstructive pulmonary disease) ([place])    Dementia ([place])    Diverticulosis    Dysphagia    to solids and pills .  On left side   Esophageal cancer ([place]) 05/15/12   bx=, middle,Invasive squamous cell ca,   Hot flashes    Hyperlipidemia    Hypertension    Mitral valve insufficiency    Osteoporosis    Paralytic gait    Unsteadiness on feet     Patient Active Problem List   Diagnosis Date Noted   Closed fracture of right wrist 10/01/18 12/05/2018   S/P ORIF (open reduction internal fixation) fracture    Closed fracture of subtrochanteric section of femur, right, initial encounter ([place]) 09/13/2016   History of esophageal cancer 09/13/2016   Dementia ([place]) 09/13/2016   Carcinoma of middle third of esophagus ([place]) 05/24/2012   Dysphagia    Esophageal cancer ([place]) 05/15/2012    Past Surgical History:  Procedure Laterality Date   ABDOMINAL HYSTERECTOMY  1027,2536   salpingo-opherctomy 1986   ANTERIOR AND POSTERIOR VAGINAL REPAIR W/ SACROSPINOUS LIGAMENT SUSPENSION     Biopsy of  Esophagus  05/15/12   Invasive Squamous Cell Carcinoma   colon polyps     hx adenomatous   COLONOSCOPY  2008   neg   EUS  06/03/2012   Procedure: UPPER ENDOSCOPIC ULTRASOUND (EUS) LINEAR;  Surgeon: Milus Banister, MD;  Location: [facility];  Service: Endoscopy;  Laterality: N/A;  +/- radial and liner    EXPLORATORY LAPAROTOMY  1985   FEMUR IM NAIL Right 09/14/2016   Procedure: INTRAMEDULLARY (IM) NAIL FEMORAL;  Surgeon: Marchia Bond, MD;  Location: [place];  Service: Orthopedics;  Laterality: Right;   orif left elbow  05/13/08   s/p fall, olecranon fracture   TONSILLECTOMY     TUBAL LIGATION       OB History   No obstetric history on file.     Family History  Problem Relation Age of Onset   CVA Mother    Lung cancer Mother    Alzheimer's disease Mother    Cancer Mother    Kidney failure Father    Colon polyps Daughter     Social History   Tobacco Use   Smoking status: Former Smoker    Packs/day: 0.50    Years: 30.00    Pack years: 15.00    Types: Cigarettes    Quit date: 2018    Years since quitting: 4.1   Smokeless tobacco:  Never Used  Substance Use Topics   Alcohol use: No   Drug use: No    Comment: 30+years smoking<1ppd    Home Medications Prior to Admission medications   Medication Sig Start Date End Date Taking? Authorizing Provider  acetaminophen (TYLENOL) 325 MG tablet Take 2 tablets (650 mg total) by mouth every 6 (six) hours as needed. 09/14/16   Marchia Bond, MD  Calcium Citrate (CITRACAL PO) Take 1 tablet by mouth daily.    [provider]  Cholecalciferol (VITAMIN D-3) 1000 UNITS CAPS Take 1,000 Units by mouth daily.     [provider]  citalopram (CELEXA) 10 MG tablet  10/07/18   [provider]  clonazePAM (KLONOPIN) 0.5 MG tablet Take 1 tablet (0.5 mg total) by mouth 2 (two) times daily as needed for anxiety (anxiety/agitation). 09/18/16   Domenic Polite, MD  divalproex (DEPAKOTE) 250 MG DR tablet   10/16/18   [provider]  enoxaparin (LOVENOX) 30 MG/0.3ML injection Inject 0.3 mLs (30 mg total) into the skin daily. 09/15/16   Marchia Bond, MD  esomeprazole ([place]) 40 MG capsule  10/06/18   [provider]  Memantine HCl-Donepezil HCl (NAMZARIC) 28-10 MG CP24 Take 1 capsule by mouth daily.    [provider]  metoprolol succinate (TOPROL-XL) 25 MG 24 hr tablet Take 25 mg by mouth daily with lunch.     [provider]  risperiDONE (RISPERDAL) 0.25 MG tablet Take 0.25 mg by mouth daily as needed (for aggitation).  07/27/16   [provider]  traMADol Veatrice Bourbon) 50 MG tablet  10/03/18   [provider]    Allergies    Codeine and Lipitor [atorvastatin]  Review of Systems   Review of Systems  Unable to perform ROS: Dementia (level 5 caveat)    Physical Exam Updated Vital Signs BP (!) 135/53 (BP Location: Left Arm)    Pulse (!) 54    Temp 98.1 F (36.7 C) (Oral)    Resp 17    Ht 5\' 3"  (1.6 m)    Wt 63 kg    SpO2 93%    BMI 24.60 kg/m   Physical Exam Constitutional:      General: She is not in acute distress.    Comments: Frail, chronically ill appearing  HENT:     Head: Normocephalic and atraumatic.  Eyes:     Conjunctiva/sclera: Conjunctivae normal.     Pupils: Pupils are equal, round, and reactive to light.  Cardiovascular:     Rate and Rhythm: Normal rate and regular rhythm.  Pulmonary:     Effort: Pulmonary effort is normal. No respiratory distress.  Abdominal:     General: There is no distension.     Tenderness: There is no abdominal tenderness.  Skin:    General: Skin is warm and dry.  Neurological:     Mental Status: She is alert.     ED Results / Procedures / Treatments   Labs (all labs ordered are listed, but only abnormal results are displayed) Labs Reviewed - No data to display  EKG None  Radiology CT Head Wo Contrast  Result Date: 07/31/2020 CLINICAL DATA:  Status post fall. EXAM: CT HEAD WITHOUT  CONTRAST TECHNIQUE: Contiguous axial images were obtained from the base of the skull through the vertex without intravenous contrast. COMPARISON:  July 18, 2020 FINDINGS: Brain: There is moderate to marked severity cerebral atrophy with widening of the extra-axial spaces and ventricular dilatation. Stable dilatation of the left lateral ventricle is  seen. There are areas of decreased attenuation within the white matter tracts of the supratentorial brain, consistent with microvascular disease changes. Vascular: No hyperdense vessel or unexpected calcification. Skull: Normal. Negative for fracture or focal lesion. Sinuses/Orbits: No acute finding. Other: None. IMPRESSION: 1. Generalized cerebral atrophy and microvascular disease changes of the supratentorial brain. 2. No acute intracranial abnormality. Electronically Signed   By: Virgina Norfolk M.D.   On: 07/31/2020 20:49    Procedures Procedures   Medications Ordered in ED Medications - No data to display  ED Course  I have reviewed the triage vital signs and the nursing notes.  Pertinent labs & imaging results that were available during my care of the patient were reviewed by me and considered in my medical decision making (see chart for details).  87 yo w/ advanced dementia, minimally verbal, here with witnessed fall and head injury at rehab facility.  CTH negative for acute traumatic injury.  No other acute traumatic injuries noted on exam.    Plan to arrange transportation back to [place].     Final Clinical Impression(s) / ED Diagnoses Final diagnoses:  Injury of head, initial encounter    Rx / DC Orders ED Discharge Orders    None       Davonte Siebenaler, Carola Rhine, MD 08/01/20 [phone]

## 2020-07-31 NOTE — ED Notes (Signed)
Patient transported back from [place] 

## 2020-07-31 NOTE — Discharge Instructions (Signed)
CT scan of the head negative for acute injury.  No report of brain bleed on imaging.

## 2020-07-31 NOTE — ED Notes (Signed)
Pt from [place]

## 2020-07-31 NOTE — ED Notes (Signed)
EMS called for transport.

## 2020-07-31 NOTE — ED Triage Notes (Signed)
Pt brought by [facility] for possible fall. Facility wanting pt to have a head CT.

## 2020-10-17 ENCOUNTER — Emergency Department (HOSPITAL_COMMUNITY): Payer: Medicare HMO

## 2020-10-17 ENCOUNTER — Other Ambulatory Visit: Payer: Self-pay

## 2020-10-17 ENCOUNTER — Encounter (HOSPITAL_COMMUNITY): Payer: Self-pay | Admitting: Emergency Medicine

## 2020-10-17 ENCOUNTER — Emergency Department (HOSPITAL_COMMUNITY)
Admission: EM | Admit: 2020-10-17 | Discharge: 2020-10-17 | Disposition: A | Discharge status: Home / Self Care | Payer: Medicare HMO | Attending: Emergency Medicine | Admitting: Emergency Medicine

## 2020-10-17 DIAGNOSIS — Z8501 Personal history of malignant neoplasm of esophagus: Secondary | ICD-10-CM | POA: Insufficient documentation

## 2020-10-17 DIAGNOSIS — J449 Chronic obstructive pulmonary disease, unspecified: Secondary | ICD-10-CM | POA: Insufficient documentation

## 2020-10-17 DIAGNOSIS — Z87891 Personal history of nicotine dependence: Secondary | ICD-10-CM | POA: Diagnosis not present

## 2020-10-17 DIAGNOSIS — F0390 Unspecified dementia without behavioral disturbance: Secondary | ICD-10-CM | POA: Diagnosis not present

## 2020-10-17 DIAGNOSIS — S52514A Nondisplaced fracture of right radial styloid process, initial encounter for closed fracture: Secondary | ICD-10-CM | POA: Diagnosis not present

## 2020-10-17 DIAGNOSIS — I10 Essential (primary) hypertension: Secondary | ICD-10-CM | POA: Diagnosis not present

## 2020-10-17 DIAGNOSIS — Z79899 Other long term (current) drug therapy: Secondary | ICD-10-CM | POA: Diagnosis not present

## 2020-10-17 DIAGNOSIS — S6991XA Unspecified injury of right wrist, hand and finger(s), initial encounter: Secondary | ICD-10-CM | POA: Diagnosis present

## 2020-10-17 DIAGNOSIS — W19XXXA Unspecified fall, initial encounter: Secondary | ICD-10-CM | POA: Insufficient documentation

## 2020-10-17 NOTE — ED Triage Notes (Signed)
Pt from [place] after sustaining a fall and found to have bruising and swelling to R wrist area. Xray completed and pt found to have an "acute radial styloid fracture" and pt was sent here for further treatment of this injury.

## 2020-10-17 NOTE — ED Provider Notes (Signed)
Pt is an 87 y/o female - with dementia - from NH - comes with Dx of possible wrist fracture - ? Radial styloid?  She has pain and bruising and swelling after a fall.  She can't give any history - has some pain and bruising of the R wrist - she has normal pulses at R wrist but some mild deformity.  Slight leg length discrepancy but has good ROM of both hips - will get hip xray and wrist xray to confirm trauma and dx as not films were sent with pt.  Medical screening examination/treatment/procedure(s) were conducted as a shared visit with non-physician practitioner(s) and myself.  I personally evaluated the patient during the encounter.  Clinical Impression:   Final diagnoses:  Closed nondisplaced fracture of styloid process of right radius, initial encounter         Noemi Chapel, MD 10/19/20 1539

## 2020-10-17 NOTE — Discharge Instructions (Signed)
Patient was found to have acute radial styloid fracture.  Patient was placed in removable Velcro splint due to this.  Please have patient follow-up with her primary care provider.  Get help right away if: You cannot move your fingers. You have severe pain. Your fingers or your hand: Become numb, cold, or pale. Turn a bluish color.

## 2020-10-17 NOTE — ED Notes (Signed)
Pt attempting to get out of bed, repositioned in bed, bed alarm in place. Brace to R wrist applied. NAD noted. Will continue to monitor.

## 2020-10-17 NOTE — ED Notes (Signed)
Report given to [facility], LPN @ [place].

## 2020-10-17 NOTE — ED Provider Notes (Signed)
[facility] EMERGENCY DEPARTMENT Provider Note   CSN: [id number] Arrival date & time: 10/17/20  1911     History Chief Complaint  Patient presents with  . Wrist Injury    [place] is a 87 y.o. female dementia, aphasia, COPD, hypertension, unsteadiness on feet.  Patient presents from [place] injury to right wrist.   Spoke to representative from [employer] who states that the incident report mentions the patient had an unwitnessed fall yesterday morning.  Patient was found sitting upright on the floor.  No signs of head injury at the time.  Facility representative reports that patient had x-ray imaging of right forearm and wrist today and was sent to the emergency department due to findings of this imaging.  Level 5 caveat applies as patient has dementia.  HPI     Past Medical History:  Diagnosis Date  . Adenomatous colon polyp 12/1993  . Allergy    lipitor  . Alzheimer disease ([place])   . Anxiety   . Aphasia   . Cognitive communication deficit   . COPD (chronic obstructive pulmonary disease) ([place])   . Dementia ([place])   . Diverticulosis   . Dysphagia    to solids and pills .  On left side  . Esophageal cancer ([place]) 05/15/12   bx=, middle,Invasive squamous cell ca,  . Hot flashes   . Hyperlipidemia   . Hypertension   . Mitral valve insufficiency   . Osteoporosis   . Paralytic gait   . Unsteadiness on feet     Patient Active Problem List   Diagnosis Date Noted  . Closed fracture of right wrist 10/01/18 12/05/2018  . S/P ORIF (open reduction internal fixation) fracture   . Closed fracture of subtrochanteric section of femur, right, initial encounter ([place]) 09/13/2016  . History of esophageal cancer 09/13/2016  . Dementia ([place]) 09/13/2016  . Carcinoma of middle third of esophagus ([place]) 05/24/2012  . Dysphagia   . Esophageal cancer ([place]) 05/15/2012    Past Surgical History:  Procedure Laterality Date  . ABDOMINAL HYSTERECTOMY   6962,9528   salpingo-opherctomy 1986  . ANTERIOR AND POSTERIOR VAGINAL REPAIR W/ SACROSPINOUS LIGAMENT SUSPENSION    . Biopsy of Esophagus  05/15/12   Invasive Squamous Cell Carcinoma  . colon polyps     hx adenomatous  . COLONOSCOPY  2008   neg  . EUS  06/03/2012   Procedure: UPPER ENDOSCOPIC ULTRASOUND (EUS) LINEAR;  Surgeon: Milus Banister, MD;  Location: [facility];  Service: Endoscopy;  Laterality: N/A;  +/- radial and liner   . EXPLORATORY LAPAROTOMY  1985  . FEMUR IM NAIL Right 09/14/2016   Procedure: INTRAMEDULLARY (IM) NAIL FEMORAL;  Surgeon: Marchia Bond, MD;  Location: [place];  Service: Orthopedics;  Laterality: Right;  . orif left elbow  05/13/08   s/p fall, olecranon fracture  . TONSILLECTOMY    . TUBAL LIGATION       OB History   No obstetric history on file.     Family History  Problem Relation Age of Onset  . CVA Mother   . Lung cancer Mother   . Alzheimer's disease Mother   . Cancer Mother   . Kidney failure Father   . Colon polyps Daughter     Social History   Tobacco Use  . Smoking status: Former Smoker    Packs/day: 0.50    Years: 30.00    Pack years: 15.00    Types: Cigarettes    Quit  date: 2018    Years since quitting: 4.3  . Smokeless tobacco: Never Used  Substance Use Topics  . Alcohol use: No  . Drug use: No    Comment: 30+years smoking<1ppd    Home Medications Prior to Admission medications   Medication Sig Start Date End Date Taking? Authorizing Provider  acetaminophen (TYLENOL) 325 MG tablet Take 2 tablets (650 mg total) by mouth every 6 (six) hours as needed. 09/14/16   Marchia Bond, MD  Calcium Citrate (CITRACAL PO) Take 1 tablet by mouth daily.    [provider]  Cholecalciferol (VITAMIN D-3) 1000 UNITS CAPS Take 1,000 Units by mouth daily.     [provider]  citalopram (CELEXA) 10 MG tablet  10/07/18   [provider]  clonazePAM (KLONOPIN) 0.5 MG tablet Take 1 tablet (0.5 mg total) by mouth 2  (two) times daily as needed for anxiety (anxiety/agitation). 09/18/16   Domenic Polite, MD  divalproex (DEPAKOTE) 250 MG DR tablet  10/16/18   [provider]  enoxaparin (LOVENOX) 30 MG/0.3ML injection Inject 0.3 mLs (30 mg total) into the skin daily. 09/15/16   Marchia Bond, MD  esomeprazole ([place]) 40 MG capsule  10/06/18   [provider]  Memantine HCl-Donepezil HCl (NAMZARIC) 28-10 MG CP24 Take 1 capsule by mouth daily.    [provider]  metoprolol succinate (TOPROL-XL) 25 MG 24 hr tablet Take 25 mg by mouth daily with lunch.     [provider]  risperiDONE (RISPERDAL) 0.25 MG tablet Take 0.25 mg by mouth daily as needed (for aggitation).  07/27/16   [provider]  traMADol Veatrice Bourbon) 50 MG tablet  10/03/18   [provider]    Allergies    Codeine and Lipitor [atorvastatin]  Review of Systems   Review of Systems  Unable to perform ROS: Dementia    Physical Exam Updated Vital Signs BP (!) 170/59 (BP Location: Left Arm)   Pulse 67   Temp 98.9 F (37.2 C) (Oral)   Resp 18   Ht 5\' 3"  (1.6 m)   Wt 66 kg   SpO2 96%   BMI 25.77 kg/m   Physical Exam Vitals and nursing note reviewed.  Constitutional:      General: She is not in acute distress.    Appearance: She is not ill-appearing, toxic-appearing or diaphoretic.  HENT:     Head: Normocephalic. No raccoon eyes, Battle's sign, abrasion, contusion, masses, right periorbital erythema, left periorbital erythema or laceration.     Jaw: No trismus, tenderness or swelling.  Eyes:     General: No scleral icterus.       Right eye: No discharge.        Left eye: No discharge.     Extraocular Movements: Extraocular movements intact.     Pupils: Pupils are equal, round, and reactive to light.  Cardiovascular:     Rate and Rhythm: Normal rate.  Pulmonary:     Effort: Pulmonary effort is normal. No respiratory distress.     Breath sounds: No stridor.  Chest:     Chest wall: No  mass, lacerations, deformity, swelling, tenderness, crepitus or edema.  Abdominal:     General: Abdomen is flat. There is no distension. There are no signs of injury.     Palpations: Abdomen is soft. There is no mass or pulsatile mass.     Tenderness: There is no abdominal tenderness. There is no guarding or rebound.     Hernia: There is no hernia  in the umbilical area or ventral area.  Musculoskeletal:     Cervical back: Normal range of motion and neck supple. No rigidity.     Comments: No midline tenderness, deformity, or step-off to cervical, thoracic, or lumbar spine.  Patient has swelling and bruising to right wrist.  Minimal tenderness to palpation.  Patient has +3 right radial wrist, patient has full range of motion to all digits of right hand.  Patient noted to have shortening and external rotation of right lower extremity.  No tenderness to palpation of hip, no bony deformity observed.  Patient has no other tenderness, bony tenderness, or deformity noted  Skin:    General: Skin is warm and dry.  Neurological:     General: No focal deficit present.     Mental Status: She is alert. Mental status is at baseline.     GCS: GCS eye subscore is 4. GCS verbal subscore is 3. GCS motor subscore is 6.     Cranial Nerves: No facial asymmetry.     Motor: No weakness, tremor or seizure activity.     Comments: Patient has no facial asymmetry, able to move all limbs without difficulty  Per facility staff patient is alert to person at baseline  Psychiatric:        Behavior: Behavior is cooperative.     ED Results / Procedures / Treatments   Labs (all labs ordered are listed, but only abnormal results are displayed) Labs Reviewed - No data to display  EKG None  Radiology DG Wrist Complete Right  Result Date: 10/17/2020 CLINICAL DATA:  Recent fall with wrist pain and swelling, initial encounter EXAM: RIGHT WRIST - COMPLETE 3+ VIEW COMPARISON:  12/09/2018 FINDINGS: There are chronic  changes of distal radial fracture with healing in the metaphysis. There is a fracture of the radial styloid identified laterally. Degenerative changes at the first [facility] joint are noted. No carpal fractures are seen. IMPRESSION: Radial styloid fracture acute in nature. Healed distal radial metaphyseal fracture. Electronically Signed   By: Inez Catalina M.D.   On: 10/17/2020 20:15   DG Hip Unilat With Pelvis 2-3 Views Right  Result Date: 10/17/2020 CLINICAL DATA:  Recent fall with right leg pain, initial encounter EXAM: DG HIP (WITH OR WITHOUT PELVIS) 2-3V RIGHT COMPARISON:  05/03/2020 FINDINGS: Pelvic ring is intact. Medullary rod with proximal fixation screw is seen within the right femur. Prior intratrochanteric fracture with healing is noted. No acute fracture is seen. No soft tissue abnormality is noted. IMPRESSION: Prior right femoral fracture with operative fixation. No acute abnormality noted. Electronically Signed   By: Inez Catalina M.D.   On: 10/17/2020 20:16    Procedures Procedures   Medications Ordered in ED Medications - No data to display  ED Course  I have reviewed the triage vital signs and the nursing notes.  Pertinent labs & imaging results that were available during my care of the patient were reviewed by me and considered in my medical decision making (see chart for details).    MDM Rules/Calculators/A&P                          87 year old female with history of dementia.  Per facility staff patient is at her mental baseline.  Patient sent due to unwitnessed fall that occurred 1 day prior.  Patient had x-ray imaging which showed acute radial styloid fracture.  Patient was sent to emergency department due to this finding.  Unable to view this imaging.  Will repeat imaging at this time.  Physical exam patient was noted to have shortening and external rotation to right lower extremity.  Concern for possible right hip fracture or dislocation.  Will obtain imaging at this  time.  Patient has no midline tenderness, deformity, or step-off to cervical, thoracic, or lumbar spine.  Patient's head is atraumatic.  Patient able to move all limbs equally without difficulty.  Low suspicion for intracranial abnormality at this time.  X-ray imaging of right wrist shows radial styloid fracture acute in nature, healed distal radial metaphyseal fracture. Due to this finding will place patient in removable splint and have her follow-up with primary care provider.  X-ray imaging of right hip shows prior right femoral fracture with operative fixation.  No acute abnormality noted.  Patient hemodynamically stable and safe for discharge at this time.  Final Clinical Impression(s) / ED Diagnoses Final diagnoses:  Closed nondisplaced fracture of styloid process of right radius, initial encounter    Rx / DC Orders ED Discharge Orders    None       Loni Beckwith, PA-C 10/18/20 0040    Noemi Chapel, MD 10/19/20 1539

## 2020-10-19 ENCOUNTER — Other Ambulatory Visit: Payer: Self-pay

## 2020-10-19 ENCOUNTER — Ambulatory Visit (INDEPENDENT_AMBULATORY_CARE_PROVIDER_SITE_OTHER): Payer: Medicare HMO | Admitting: Orthopaedic Surgery

## 2020-10-19 ENCOUNTER — Encounter: Payer: Self-pay | Admitting: Orthopaedic Surgery

## 2020-10-19 DIAGNOSIS — W19XXXA Unspecified fall, initial encounter: Secondary | ICD-10-CM

## 2020-10-19 DIAGNOSIS — S52514A Nondisplaced fracture of right radial styloid process, initial encounter for closed fracture: Secondary | ICD-10-CM

## 2020-10-19 NOTE — Progress Notes (Signed)
Patient Jamie [name], female DOB:07/11/1928, 87 y.o. [id number]  Chief Complaint  Patient presents with  . Wrist Injury    Fx radial styloid and healed distal radius fx    HPI  Jamie [name] is a 87 y.o. female who is a resident at [facility] in [place].  She had a fall on 10-17-20 and hurt her right wrist.  X-rays showed an acute fracture of the right radius styloid area and old healed metaphyseal fracture.  She has no other injuries.  I have reviewed the notes from the nursing home. I have independently reviewed and interpreted x-rays of this patient done at another site by another physician or qualified health professional.  She is disoriented and not talking.  I have used the records and the person accompanying her for the history.   There is no height or weight on file to calculate BMI.  ROS  Review of Systems  Constitutional: Positive for activity change.  Respiratory: Positive for shortness of breath.   Musculoskeletal: Positive for arthralgias and joint swelling.  Psychiatric/Behavioral:       Disoriented, chronic  All other systems reviewed and are negative.   All other systems reviewed and are negative.  The following is a summary of the past history medically, past history surgically, known current medicines, social history and family history.  This information is gathered electronically by the computer from prior information and documentation.  I review this each visit and have found including this information at this point in the chart is beneficial and informative.    Past Medical History:  Diagnosis Date  . Adenomatous colon polyp 12/1993  . Allergy    lipitor  . Alzheimer disease ([place])   . Anxiety   . Aphasia   . Cognitive communication deficit   . COPD (chronic obstructive pulmonary disease) ([place])   . Dementia ([place])   . Diverticulosis   . Dysphagia    to solids and pills .  On left side  . Esophageal cancer ([place]) 05/15/12   bx=,  middle,Invasive squamous cell ca,  . Hot flashes   . Hyperlipidemia   . Hypertension   . Mitral valve insufficiency   . Osteoporosis   . Paralytic gait   . Unsteadiness on feet     Past Surgical History:  Procedure Laterality Date  . ABDOMINAL HYSTERECTOMY  5284,1324   salpingo-opherctomy 1986  . ANTERIOR AND POSTERIOR VAGINAL REPAIR W/ SACROSPINOUS LIGAMENT SUSPENSION    . Biopsy of Esophagus  05/15/12   Invasive Squamous Cell Carcinoma  . colon polyps     hx adenomatous  . COLONOSCOPY  2008   neg  . EUS  06/03/2012   Procedure: UPPER ENDOSCOPIC ULTRASOUND (EUS) LINEAR;  Surgeon: Milus Banister, MD;  Location: [facility];  Service: Endoscopy;  Laterality: N/A;  +/- radial and liner   . EXPLORATORY LAPAROTOMY  1985  . FEMUR IM NAIL Right 09/14/2016   Procedure: INTRAMEDULLARY (IM) NAIL FEMORAL;  Surgeon: Marchia Bond, MD;  Location: [facility];  Service: Orthopedics;  Laterality: Right;  . orif left elbow  05/13/08   s/p fall, olecranon fracture  . TONSILLECTOMY    . TUBAL LIGATION      Family History  Problem Relation Age of Onset  . CVA Mother   . Lung cancer Mother   . Alzheimer's disease Mother   . Cancer Mother   . Kidney failure Father   . Colon polyps Daughter     Social History Social History  Tobacco Use  . Smoking status: Former Smoker    Packs/day: 0.50    Years: 30.00    Pack years: 15.00    Types: Cigarettes    Quit date: 2018    Years since quitting: 4.3  . Smokeless tobacco: Never Used  Substance Use Topics  . Alcohol use: No  . Drug use: No    Comment: 30+years smoking<1ppd    Allergies  Allergen Reactions  . Codeine Other (See Comments)    Unable to take  . Lipitor [Atorvastatin] Other (See Comments)    Aching, musculoskeletal    Current Outpatient Medications  Medication Sig Dispense Refill  . acetaminophen (TYLENOL) 325 MG tablet Take 2 tablets (650 mg total) by mouth every 6 (six) hours as needed. 60 tablet 1  . Calcium Citrate  (CITRACAL PO) Take 1 tablet by mouth daily.    . Cholecalciferol (VITAMIN D-3) 1000 UNITS CAPS Take 1,000 Units by mouth daily.     . citalopram (CELEXA) 10 MG tablet     . clonazePAM (KLONOPIN) 0.5 MG tablet Take 1 tablet (0.5 mg total) by mouth 2 (two) times daily as needed for anxiety (anxiety/agitation). 15 tablet 0  . divalproex (DEPAKOTE) 250 MG DR tablet     . enoxaparin (LOVENOX) 30 MG/0.3ML injection Inject 0.3 mLs (30 mg total) into the skin daily. 21 Syringe 0  . esomeprazole (NEXIUM) 40 MG capsule     . Memantine HCl-Donepezil HCl 28-10 MG CP24 Take 1 capsule by mouth daily.    . metoprolol succinate (TOPROL-XL) 25 MG 24 hr tablet Take 25 mg by mouth daily with lunch.     . risperiDONE (RISPERDAL) 0.25 MG tablet Take 0.25 mg by mouth daily as needed (for aggitation).     . traMADol (ULTRAM) 50 MG tablet      No current facility-administered medications for this visit.   Facility-Administered Medications Ordered in Other Visits  Medication Dose Route Frequency Provider Last Rate Last Admin  . topical emolient (BIAFINE) emulsion   Topical Daily Kyung Rudd, MD   Given at 06/18/12 6789     Physical Exam  There were no vitals taken for this visit.  Constitutional: overall normal hygiene, normal nutrition, well developed, normal grooming, normal body habitus. Assistive device:none  Musculoskeletal: gait and station Limp in wheelchair, muscle tone and strength are normal, no tremors or atrophy is present.  .  Neurological: coordination overall normal.  Deep tendon reflex/nerve stretch intact.  Sensation normal.  Cranial nerves II-XII intact. Disoriented.  Does not talk.  Pleasant.  Skin:   Normal overall no scars, lesions, ulcers or rashes. No psoriasis.  Psychiatric: Alert but disoriented x 3.   Cardiovascular: overall no swelling, no varicosities, no edema bilaterally, normal temperatures of the legs and arms, no clubbing, cyanosis and good capillary refill.  Lymphatic:  palpation is normal.  Right distal radius with ecchymosis and pain over the radial styloid area.  NV intact.  ROM decreased.  All other systems reviewed and are negative   The patient has been educated about the nature of the problem(s) and counseled on treatment options.  The patient appeared to understand what I have discussed and is in agreement with it.  Encounter Diagnosis  Name Primary?  . Closed nondisplaced fracture of styloid process of right radius, initial encounter Yes    PLAN Call if any problems.  Precautions discussed.  Continue current medications.   Return to clinic 2 weeks   X-rays right wrist on return.  Cock-up  splint provided.  Forms completed for nursing home.  Electronically Signed Sanjuana Kava, MD 5/10/20228:27 AM

## 2020-11-02 ENCOUNTER — Ambulatory Visit: Payer: Medicare HMO | Admitting: Orthopaedic Surgery

## 2020-11-09 ENCOUNTER — Ambulatory Visit: Payer: Medicare HMO

## 2020-11-09 ENCOUNTER — Other Ambulatory Visit: Payer: Self-pay

## 2020-11-09 ENCOUNTER — Ambulatory Visit (INDEPENDENT_AMBULATORY_CARE_PROVIDER_SITE_OTHER): Payer: Medicare HMO | Admitting: Orthopaedic Surgery

## 2020-11-09 ENCOUNTER — Encounter: Payer: Self-pay | Admitting: Orthopaedic Surgery

## 2020-11-09 VITALS — Ht 66.0 in | Wt 140.0 lb

## 2020-11-09 DIAGNOSIS — S62101D Fracture of unspecified carpal bone, right wrist, subsequent encounter for fracture with routine healing: Secondary | ICD-10-CM

## 2020-11-09 NOTE — Progress Notes (Signed)
Her daughter accompanies her.  Patient chronically disoriented.  Daughter source of history and I have notes from nursing home as well.  She has no pain of the right wrist.  ROM is full.  NV intact.  X-rays were done of the right distal radius, reported separately.  Encounter Diagnosis  Name Primary?  . Closed fracture of right wrist with routine healing, subsequent encounter Yes   Discharge.  Forms completed for nursing home.  Call if any problem.  Precautions discussed.   Electronically Signed Sanjuana Kava, MD 5/31/20223:15 PM

## 2020-12-05 ENCOUNTER — Emergency Department (HOSPITAL_COMMUNITY): Payer: Medicare HMO

## 2020-12-05 ENCOUNTER — Encounter (HOSPITAL_COMMUNITY): Payer: Self-pay | Admitting: *Deleted

## 2020-12-05 ENCOUNTER — Other Ambulatory Visit: Payer: Self-pay

## 2020-12-05 ENCOUNTER — Emergency Department (HOSPITAL_COMMUNITY)
Admission: EM | Admit: 2020-12-05 | Discharge: 2020-12-05 | Disposition: A | Discharge status: Skilled Nursing Facility | Payer: Medicare HMO | Attending: Emergency Medicine | Admitting: Emergency Medicine

## 2020-12-05 DIAGNOSIS — S0083XA Contusion of other part of head, initial encounter: Secondary | ICD-10-CM | POA: Diagnosis not present

## 2020-12-05 DIAGNOSIS — Z79899 Other long term (current) drug therapy: Secondary | ICD-10-CM | POA: Diagnosis not present

## 2020-12-05 DIAGNOSIS — Z87891 Personal history of nicotine dependence: Secondary | ICD-10-CM | POA: Diagnosis not present

## 2020-12-05 DIAGNOSIS — S0990XA Unspecified injury of head, initial encounter: Secondary | ICD-10-CM

## 2020-12-05 DIAGNOSIS — J449 Chronic obstructive pulmonary disease, unspecified: Secondary | ICD-10-CM | POA: Diagnosis not present

## 2020-12-05 DIAGNOSIS — F0390 Unspecified dementia without behavioral disturbance: Secondary | ICD-10-CM | POA: Insufficient documentation

## 2020-12-05 DIAGNOSIS — W19XXXA Unspecified fall, initial encounter: Secondary | ICD-10-CM

## 2020-12-05 DIAGNOSIS — W1809XA Striking against other object with subsequent fall, initial encounter: Secondary | ICD-10-CM | POA: Diagnosis not present

## 2020-12-05 DIAGNOSIS — Z8501 Personal history of malignant neoplasm of esophagus: Secondary | ICD-10-CM | POA: Diagnosis not present

## 2020-12-05 DIAGNOSIS — I10 Essential (primary) hypertension: Secondary | ICD-10-CM | POA: Insufficient documentation

## 2020-12-05 DIAGNOSIS — Y9289 Other specified places as the place of occurrence of the external cause: Secondary | ICD-10-CM | POA: Diagnosis not present

## 2020-12-05 HISTORY — DX: Major depressive disorder, single episode, unspecified: F32.9

## 2020-12-05 HISTORY — DX: Gastro-esophageal reflux disease without esophagitis: K21.9

## 2020-12-05 NOTE — ED Notes (Signed)
edp at bedside  

## 2020-12-05 NOTE — ED Triage Notes (Signed)
Pt brought in by [facility] from [facility] with c/o witnessed fall today. Nursing staff reported that pt fell face down and hit her head on a medication cart and is favoring her right arm. Recent break in right arm, no use of blood thinners and no LOC per nursing staff at [facility].

## 2020-12-05 NOTE — ED Notes (Signed)
Transport called at this time.  

## 2020-12-05 NOTE — Discharge Instructions (Addendum)
You have been seen and discharged from the emergency department.  Your imaging was negative for traumatic injury.  Follow-up with your primary provider for reevaluation and further care. Take home medications as prescribed. If you have any worsening symptoms or further concerns for your health please return to an emergency department for further evaluation.

## 2020-12-05 NOTE — ED Notes (Signed)
Pt continues to attempt to remove gown and leads. Redirected as needed but only works a short while. Fall precautions in place and fall mats at bedside.

## 2020-12-05 NOTE — ED Provider Notes (Signed)
[facility] EMERGENCY DEPARTMENT Provider Note   CSN: [id number] Arrival date & time: 12/05/20  1000     History Chief Complaint  Patient presents with   [place] is a 87 y.o. female.  HPI  87 year old female with past medical history of dementia, HTN, HLD presents the emergency department with a witnessed fall and head injury.  Report from staff is that the patient fell forward hitting the right side of her head on a medicine table.  No reported loss of consciousness.  Patient is demented and difficult to understand, she has mumbled speech.  This is all documented to be baseline.  Patient is not noted to be on any anticoagulation.  Patient does not contribute to history, no staff or family at bedside.  Past Medical History:  Diagnosis Date   Adenomatous colon polyp 12/1993   Allergy    lipitor   Alzheimer disease ([place])    Anxiety    Aphasia    Cognitive communication deficit    COPD (chronic obstructive pulmonary disease) ([place])    Dementia ([place])    Diverticulosis    Dysphagia    to solids and pills .  On left side   Esophageal cancer ([place]) 05/15/2012   bx=, middle,Invasive squamous cell ca,   GERD (gastroesophageal reflux disease)    Hot flashes    Hyperlipidemia    Hypertension    MDD (major depressive disorder)    Mitral valve insufficiency    Osteoporosis    Paralytic gait    Unsteadiness on feet     Patient Active Problem List   Diagnosis Date Noted   Closed fracture of right wrist 10/01/18 12/05/2018   S/P ORIF (open reduction internal fixation) fracture    Closed fracture of subtrochanteric section of femur, right, initial encounter ([place]) 09/13/2016   History of esophageal cancer 09/13/2016   Dementia ([place]) 09/13/2016   Carcinoma of middle third of esophagus ([place]) 05/24/2012   Dysphagia    Esophageal cancer ([place]) 05/15/2012    Past Surgical History:  Procedure Laterality Date   ABDOMINAL HYSTERECTOMY  0623,7628   salpingo-opherctomy  1986   ANTERIOR AND POSTERIOR VAGINAL REPAIR W/ SACROSPINOUS LIGAMENT SUSPENSION     Biopsy of Esophagus  05/15/12   Invasive Squamous Cell Carcinoma   colon polyps     hx adenomatous   COLONOSCOPY  2008   neg   EUS  06/03/2012   Procedure: UPPER ENDOSCOPIC ULTRASOUND (EUS) LINEAR;  Surgeon: Milus Banister, MD;  [facility]: [facility];  Service: Endoscopy;  Laterality: N/A;  +/- radial and liner    EXPLORATORY LAPAROTOMY  1985   FEMUR IM NAIL Right 09/14/2016   Procedure: INTRAMEDULLARY (IM) NAIL FEMORAL;  Surgeon: Marchia Bond, MD;  Location: [facility];  Service: Orthopedics;  Laterality: Right;   orif left elbow  05/13/08   s/p fall, olecranon fracture   TONSILLECTOMY     TUBAL LIGATION       OB History   No obstetric history on file.     Family History  Problem Relation Age of Onset   CVA Mother    Lung cancer Mother    Alzheimer's disease Mother    Cancer Mother    Kidney failure Father    Colon polyps Daughter     Social History   Tobacco Use   Smoking status: Former    Packs/day: 0.50    Years: 30.00    Pack years: 15.00    Types: Cigarettes  Quit date: 2018    Years since quitting: 4.4   Smokeless tobacco: Never  Vaping Use   Vaping Use: Never used  Substance Use Topics   Alcohol use: No   Drug use: No    Comment: 30+years smoking<1ppd    Home Medications Prior to Admission medications   Medication Sig Start Date End Date Taking? Authorizing Provider  acetaminophen (TYLENOL) 325 MG tablet Take 2 tablets (650 mg total) by mouth every 6 (six) hours as needed. 09/14/16   Marchia Bond, MD  Calcium Citrate (CITRACAL PO) Take 1 tablet by mouth daily.    [provider]  Cholecalciferol (VITAMIN D-3) 1000 UNITS CAPS Take 1,000 Units by mouth daily.     [provider]  citalopram (CELEXA) 10 MG tablet  10/07/18   [provider]  clonazePAM (KLONOPIN) 0.5 MG tablet Take 1 tablet (0.5 mg total) by mouth 2 (two) times daily as needed  for anxiety (anxiety/agitation). 09/18/16   Domenic Polite, MD  divalproex (DEPAKOTE) 250 MG DR tablet  10/16/18   [provider]  enoxaparin (LOVENOX) 30 MG/0.3ML injection Inject 0.3 mLs (30 mg total) into the skin daily. 09/15/16   Marchia Bond, MD  esomeprazole ([place]) 40 MG capsule  10/06/18   [provider]  Memantine HCl-Donepezil HCl 28-10 MG CP24 Take 1 capsule by mouth daily.    [provider]  metoprolol succinate (TOPROL-XL) 25 MG 24 hr tablet Take 25 mg by mouth daily with lunch.     [provider]  risperiDONE (RISPERDAL) 0.25 MG tablet Take 0.25 mg by mouth daily as needed (for aggitation).  07/27/16   [provider]  traMADol Veatrice Bourbon) 50 MG tablet  10/03/18   [provider]    Allergies    Codeine and Lipitor [atorvastatin]  Review of Systems   Review of Systems  Unable to perform ROS: Dementia   Physical Exam Updated Vital Signs BP (!) 180/67 (BP Location: Left Arm)   Pulse 63   Temp (!) 97.4 F (36.3 C) (Axillary)   Resp 13   Ht 5\' 6"  (1.676 m)   Wt 63.5 kg   SpO2 97%   BMI 22.60 kg/m   Physical Exam Vitals and nursing note reviewed.  Constitutional:      Appearance: Normal appearance.  HENT:     Head: Normocephalic.     Comments: Right frontal bruising and hematoma    Mouth/Throat:     Mouth: Mucous membranes are moist.  Eyes:     Pupils: Pupils are equal, round, and reactive to light.  Cardiovascular:     Rate and Rhythm: Normal rate.  Pulmonary:     Effort: Pulmonary effort is normal. No respiratory distress.  Abdominal:     Palpations: Abdomen is soft.     Tenderness: There is no abdominal tenderness.  Musculoskeletal:     Cervical back: No rigidity or tenderness.     Comments: Pelvis is stable, no obvious bony deformity, no tenderness to palpation of any bony prominence  Skin:    General: Skin is warm.  Neurological:     Mental Status: She is alert. Mental status is at baseline.   Psychiatric:        Mood and Affect: Mood normal.    ED Results / Procedures / Treatments   Labs (all labs ordered are listed, but only abnormal results are displayed) Labs Reviewed - No data to display  EKG None  Radiology No results found.  Procedures Procedures  Medications Ordered in ED Medications - No data to display  ED Course  I have reviewed the triage vital signs and the nursing notes.  Pertinent labs & imaging results that were available during my care of the patient were reviewed by me and considered in my medical decision making (see chart for details).    MDM Rules/Calculators/A&P                          87 year old female presents emergency department after a witnessed mechanical fall and head injury.  No loss of consciousness, no syncope.  Patient is noncontributory towards history due to dementia and being very difficult to understand (baseline).  Hypertensive on arrival but otherwise normal vitals.  She is noted to be severely demented and at baseline.  Head CT and cervical spine CT showed no acute fracture.  X-ray imaging showed no traumatic finding.  She has no other indication of traumatic injury/deformity.  She is baseline per nursing facility records and will be discharged.  Final Clinical Impression(s) / ED Diagnoses Final diagnoses:  Fall    Rx / DC Orders ED Discharge Orders     None        Lorelle Gibbs, DO 12/05/20 1402

## 2020-12-05 NOTE — ED Notes (Signed)
Pt noted with redness and minor swelling and abrasion to right upper forehead. Pupils equal, round, reactive. Confused at baseline with incomprehensible speech. No peripheral deformities noted. No pain with ROM of extremities x4. Resting comfortably at this time and holding babydoll. Will continue to monitor.

## 2021-01-23 ENCOUNTER — Other Ambulatory Visit: Payer: Self-pay

## 2021-01-23 ENCOUNTER — Encounter (HOSPITAL_COMMUNITY): Payer: Self-pay

## 2021-01-23 ENCOUNTER — Emergency Department (HOSPITAL_COMMUNITY)
Admission: EM | Admit: 2021-01-23 | Discharge: 2021-01-23 | Disposition: A | Discharge status: Home / Self Care | Payer: Medicare HMO | Attending: Emergency Medicine | Admitting: Emergency Medicine

## 2021-01-23 ENCOUNTER — Emergency Department (HOSPITAL_COMMUNITY): Payer: Medicare HMO

## 2021-01-23 DIAGNOSIS — I10 Essential (primary) hypertension: Secondary | ICD-10-CM | POA: Insufficient documentation

## 2021-01-23 DIAGNOSIS — Z87891 Personal history of nicotine dependence: Secondary | ICD-10-CM | POA: Diagnosis not present

## 2021-01-23 DIAGNOSIS — T17320A Food in larynx causing asphyxiation, initial encounter: Secondary | ICD-10-CM

## 2021-01-23 DIAGNOSIS — X58XXXA Exposure to other specified factors, initial encounter: Secondary | ICD-10-CM | POA: Insufficient documentation

## 2021-01-23 DIAGNOSIS — Z8501 Personal history of malignant neoplasm of esophagus: Secondary | ICD-10-CM | POA: Insufficient documentation

## 2021-01-23 DIAGNOSIS — J449 Chronic obstructive pulmonary disease, unspecified: Secondary | ICD-10-CM | POA: Diagnosis not present

## 2021-01-23 DIAGNOSIS — Z79899 Other long term (current) drug therapy: Secondary | ICD-10-CM | POA: Insufficient documentation

## 2021-01-23 DIAGNOSIS — F0390 Unspecified dementia without behavioral disturbance: Secondary | ICD-10-CM | POA: Diagnosis not present

## 2021-01-23 DIAGNOSIS — T17920A Food in respiratory tract, part unspecified causing asphyxiation, initial encounter: Secondary | ICD-10-CM | POA: Diagnosis not present

## 2021-01-23 DIAGNOSIS — R4182 Altered mental status, unspecified: Secondary | ICD-10-CM | POA: Diagnosis not present

## 2021-01-23 NOTE — ED Notes (Signed)
Nasal cannula at 2 lpm for comfort. Nurse spoke with POA daughter and informed her of status. Daughter stated pt is really confused and will pull out all IV's.

## 2021-01-23 NOTE — ED Notes (Signed)
Pure wick placed on pt, pt cleaned and dry brief was put on, had a wet brief on when she arrived by ems

## 2021-01-23 NOTE — Discharge Instructions (Addendum)
[name] has been stable in the emergency room.  She has not had any repeat episodes of choking, coughing, shaking, unresponsiveness and she has been able to tolerate liquid.  We recommend that you consider clear liquid diet for the next couple of days to ensure she is able to swallow properly.

## 2021-01-23 NOTE — ED Provider Notes (Signed)
[facility] EMERGENCY DEPARTMENT Provider Note   CSN: [id number] Arrival date & time: 01/23/21  [id number]     History Chief Complaint  Patient presents with   Choking    Jamie [name] is a 87 y.o. female.  HPI     87 year old female comes into the ER with chief complaint of choking.  Patient has history of COPD, dementia.  Jamie [name] resides at nursing home.  Level 5 caveat for severe dementia.  I spoke with the nursing home.  They reported at baseline patient has advanced dementia and often is noted to be hallucinating or talking to herself.  This morning they noted that patient was having a choking spell and Jamie [name] was having generalized shaking with it.  Heimlich maneuver was performed by the staff and they were able to retrieve a banana and some other food.  For the EMS staff, patient was having no respiratory distress.  Patient not able to provide any meaningful history.  Per nursing home facility, patient has dysphagia listed in her problem list.  Past Medical History:  Diagnosis Date   Adenomatous colon polyp 12/1993   Allergy    lipitor   Alzheimer disease ([place])    Anxiety    Aphasia    Cognitive communication deficit    COPD (chronic obstructive pulmonary disease) ([place])    Dementia ([place])    Diverticulosis    Dysphagia    to solids and pills .  On left side   Esophageal cancer ([place]) 05/15/2012   bx=, middle,Invasive squamous cell ca,   GERD (gastroesophageal reflux disease)    Hot flashes    Hyperlipidemia    Hypertension    MDD (major depressive disorder)    Mitral valve insufficiency    Osteoporosis    Paralytic gait    Unsteadiness on feet     Patient Active Problem List   Diagnosis Date Noted   Closed fracture of right wrist 10/01/18 12/05/2018   S/P ORIF (open reduction internal fixation) fracture    Closed fracture of subtrochanteric section of femur, right, initial encounter ([place]) 09/13/2016   History of esophageal cancer 09/13/2016   Dementia ([place])  09/13/2016   Carcinoma of middle third of esophagus ([place]) 05/24/2012   Dysphagia    Esophageal cancer ([place]) 05/15/2012    Past Surgical History:  Procedure Laterality Date   ABDOMINAL HYSTERECTOMY  [id number]   salpingo-opherctomy 1986   ANTERIOR AND POSTERIOR VAGINAL REPAIR W/ SACROSPINOUS LIGAMENT SUSPENSION     Biopsy of Esophagus  05/15/12   Invasive Squamous Cell Carcinoma   colon polyps     hx adenomatous   COLONOSCOPY  2008   neg   EUS  06/03/2012   Procedure: UPPER ENDOSCOPIC ULTRASOUND (EUS) LINEAR;  Surgeon: Milus Banister, MD;  [facility];  Service: Endoscopy;  Laterality: N/A;  +/- radial and liner    EXPLORATORY LAPAROTOMY  1985   FEMUR IM NAIL Right 09/14/2016   Procedure: INTRAMEDULLARY (IM) NAIL FEMORAL;  Surgeon: Marchia Bond, MD;  Location: [facility];  Service: Orthopedics;  Laterality: Right;   orif left elbow  05/13/08   s/p fall, olecranon fracture   TONSILLECTOMY     TUBAL LIGATION       OB History   No obstetric history on file.     Family History  Problem Relation Age of Onset   CVA Mother    Lung cancer Mother    Alzheimer's disease Mother    Cancer Mother    Kidney failure  Father    Colon polyps Daughter     Social History   Tobacco Use   Smoking status: Former    Packs/day: 0.50    Years: 30.00    Pack years: 15.00    Types: Cigarettes    Quit date: 2018    Years since quitting: 4.6   Smokeless tobacco: Never  Vaping Use   Vaping Use: Never used  Substance Use Topics   Alcohol use: No   Drug use: No    Comment: 30+years smoking<1ppd    Home Medications Prior to Admission medications   Medication Sig Start Date End Date Taking? Authorizing Provider  acetaminophen (TYLENOL) 325 MG tablet Take 2 tablets (650 mg total) by mouth every 6 (six) hours as needed. 09/14/16   Marchia Bond, MD  Calcium Citrate (CITRACAL PO) Take 1 tablet by mouth daily.    [provider]  Cholecalciferol (VITAMIN D-3) 1000 UNITS CAPS  Take 1,000 Units by mouth daily.     [provider]  citalopram (CELEXA) 10 MG tablet  10/07/18   [provider]  clonazePAM (KLONOPIN) 0.5 MG tablet Take 1 tablet (0.5 mg total) by mouth 2 (two) times daily as needed for anxiety (anxiety/agitation). 09/18/16   Domenic Polite, MD  divalproex (DEPAKOTE) 250 MG DR tablet  10/16/18   [provider]  enoxaparin (LOVENOX) 30 MG/0.3ML injection Inject 0.3 mLs (30 mg total) into the skin daily. 09/15/16   Marchia Bond, MD  esomeprazole ([place]) 40 MG capsule  10/06/18   [provider]  Memantine HCl-Donepezil HCl 28-10 MG CP24 Take 1 capsule by mouth daily.    [provider]  metoprolol succinate (TOPROL-XL) 25 MG 24 hr tablet Take 25 mg by mouth daily with lunch.     [provider]  risperiDONE (RISPERDAL) 0.25 MG tablet Take 0.25 mg by mouth daily as needed (for aggitation).  07/27/16   [provider]  traMADol Veatrice Bourbon) 50 MG tablet  10/03/18   [provider]    Allergies    Codeine and Lipitor [atorvastatin]  Review of Systems   Review of Systems  Unable to perform ROS: Dementia   Physical Exam Updated Vital Signs BP (!) 166/97   Pulse 85   Temp 98.3 F (36.8 C) (Oral)   Resp 20   Ht '5\' 7"'$  (1.702 m)   Wt 69.1 kg   SpO2 94%   BMI 23.85 kg/m   Physical Exam Vitals and nursing note reviewed.  Constitutional:      Appearance: Jamie [name] is well-developed.  Cardiovascular:     Rate and Rhythm: Normal rate.  Pulmonary:     Effort: Pulmonary effort is normal. No respiratory distress.     Breath sounds: No wheezing.  Abdominal:     Tenderness: There is no abdominal tenderness.  Musculoskeletal:     Cervical back: Neck supple.  Skin:    General: Skin is warm and dry.  Neurological:     Mental Status: Jamie [name] is alert. Mental status is at baseline. Jamie [name] is disoriented.    ED Results / Procedures / Treatments   Labs (all labs ordered are listed, but only abnormal  results are displayed) Labs Reviewed - No data to display  EKG None  Radiology DG Chest [facility] 1 View  Result Date: 01/23/2021 CLINICAL DATA:  Altered mental status, concern for aspiration EXAM: PORTABLE CHEST 1 VIEW COMPARISON:  Chest radiograph dated 12/05/2020. FINDINGS: The heart is mildly enlarged. The lungs are clear. No pleural effusion  or pneumothorax. There is diffuse osseous demineralization. Degenerative changes are seen in the spine. IMPRESSION: No active disease. Electronically Signed   By: Zerita Boers M.D.   On: 01/23/2021 09:36    Procedures Procedures   Medications Ordered in ED Medications - No data to display  ED Course  I have reviewed the triage vital signs and the nursing notes.  Pertinent labs & imaging results that were available during my care of the patient were reviewed by me and considered in my medical decision making (see chart for details).    MDM Rules/Calculators/A&P                           87 year old female comes in after having a choking spell.  Heimlich maneuver allegedly was performed and foreign body/food was retrieved from her mouth.  Patient is comfortable here.  Jamie [name] is noted to be speaking, incoherently.  Jamie [name] is protecting her airway.  There is no stridor, no hypoxia, no respiratory distress.  Lung exam, abdominal exam is normal.  Oral challenge was initiated and it was fine.  I have discussed the case with nursing home facility.  Patient was monitored for 4 hours.  No repeat concerning episodes here.  Stable for discharge.  Final Clinical Impression(s) / ED Diagnoses Final diagnoses:  Choking due to food (regurgitated), initial encounter    Rx / DC Orders ED Discharge Orders     None        Varney Biles, MD 01/23/21 1359

## 2021-01-23 NOTE — ED Triage Notes (Signed)
Pt arrived [facility] from [facility]. Pt was eating eggs and sausage and began choking. Staff did the heimlich maneuver and removed food. Pt airway is patent and pt is talking to herself during triage.

## 2021-01-23 NOTE — ED Notes (Signed)
Pt drank juice without difficulties.

## 2021-02-11 ENCOUNTER — Other Ambulatory Visit: Payer: Self-pay

## 2021-02-11 ENCOUNTER — Encounter (HOSPITAL_COMMUNITY): Payer: Self-pay | Admitting: *Deleted

## 2021-02-11 ENCOUNTER — Emergency Department (HOSPITAL_COMMUNITY): Payer: Medicare HMO

## 2021-02-11 ENCOUNTER — Emergency Department (HOSPITAL_COMMUNITY)
Admission: EM | Admit: 2021-02-11 | Discharge: 2021-02-11 | Disposition: A | Discharge status: Home / Self Care | Payer: Medicare HMO | Attending: Emergency Medicine | Admitting: Emergency Medicine

## 2021-02-11 DIAGNOSIS — Z7901 Long term (current) use of anticoagulants: Secondary | ICD-10-CM | POA: Insufficient documentation

## 2021-02-11 DIAGNOSIS — R1110 Vomiting, unspecified: Secondary | ICD-10-CM | POA: Diagnosis not present

## 2021-02-11 DIAGNOSIS — Z87891 Personal history of nicotine dependence: Secondary | ICD-10-CM | POA: Insufficient documentation

## 2021-02-11 DIAGNOSIS — I10 Essential (primary) hypertension: Secondary | ICD-10-CM | POA: Insufficient documentation

## 2021-02-11 DIAGNOSIS — Z79899 Other long term (current) drug therapy: Secondary | ICD-10-CM | POA: Diagnosis not present

## 2021-02-11 DIAGNOSIS — R569 Unspecified convulsions: Secondary | ICD-10-CM | POA: Insufficient documentation

## 2021-02-11 DIAGNOSIS — G309 Alzheimer's disease, unspecified: Secondary | ICD-10-CM | POA: Diagnosis not present

## 2021-02-11 DIAGNOSIS — B9689 Other specified bacterial agents as the cause of diseases classified elsewhere: Secondary | ICD-10-CM | POA: Insufficient documentation

## 2021-02-11 DIAGNOSIS — N390 Urinary tract infection, site not specified: Secondary | ICD-10-CM | POA: Diagnosis not present

## 2021-02-11 DIAGNOSIS — J449 Chronic obstructive pulmonary disease, unspecified: Secondary | ICD-10-CM | POA: Diagnosis not present

## 2021-02-11 DIAGNOSIS — Z8501 Personal history of malignant neoplasm of esophagus: Secondary | ICD-10-CM | POA: Insufficient documentation

## 2021-02-11 LAB — CBC WITH DIFFERENTIAL/PLATELET
Abs Immature Granulocytes: 0.04 10*3/uL (ref 0.00–0.07)
Basophils Absolute: 0.1 10*3/uL (ref 0.0–0.1)
Basophils Relative: 1 %
Eosinophils Absolute: 0.7 10*3/uL — ABNORMAL HIGH (ref 0.0–0.5)
Eosinophils Relative: 8 %
HCT: 38.7 % (ref 36.0–46.0)
Hemoglobin: 12 g/dL (ref 12.0–15.0)
Immature Granulocytes: 1 %
Lymphocytes Relative: 21 %
Lymphs Abs: 1.8 10*3/uL (ref 0.7–4.0)
MCH: 32.1 pg (ref 26.0–34.0)
MCHC: 31 g/dL (ref 30.0–36.0)
MCV: 103.5 fL — ABNORMAL HIGH (ref 80.0–100.0)
Monocytes Absolute: 0.5 10*3/uL (ref 0.1–1.0)
Monocytes Relative: 5 %
Neutro Abs: 5.8 10*3/uL (ref 1.7–7.7)
Neutrophils Relative %: 64 %
Platelets: 265 10*3/uL (ref 150–400)
RBC: 3.74 MIL/uL — ABNORMAL LOW (ref 3.87–5.11)
RDW: 12.9 % (ref 11.5–15.5)
WBC: 8.8 10*3/uL (ref 4.0–10.5)
nRBC: 0 % (ref 0.0–0.2)

## 2021-02-11 LAB — COMPREHENSIVE METABOLIC PANEL
ALT: 14 U/L (ref 0–44)
AST: 11 U/L — ABNORMAL LOW (ref 15–41)
Albumin: 3.1 g/dL — ABNORMAL LOW (ref 3.5–5.0)
Alkaline Phosphatase: 120 U/L (ref 38–126)
Anion gap: 8 (ref 5–15)
BUN: 25 mg/dL — ABNORMAL HIGH (ref 8–23)
CO2: 25 mmol/L (ref 22–32)
Calcium: 8.3 mg/dL — ABNORMAL LOW (ref 8.9–10.3)
Chloride: 103 mmol/L (ref 98–111)
Creatinine, Ser: 1.04 mg/dL — ABNORMAL HIGH (ref 0.44–1.00)
GFR, Estimated: 51 mL/min — ABNORMAL LOW (ref >60)
Glucose, Bld: 198 mg/dL — ABNORMAL HIGH (ref 70–99)
Potassium: 4.4 mmol/L (ref 3.5–5.1)
Sodium: 136 mmol/L (ref 135–145)
Total Bilirubin: 0.2 mg/dL — ABNORMAL LOW (ref 0.3–1.2)
Total Protein: 6.8 g/dL (ref 6.5–8.1)

## 2021-02-11 LAB — URINALYSIS, MICROSCOPIC (REFLEX): Bacteria, UA: NONE SEEN

## 2021-02-11 LAB — URINALYSIS, ROUTINE W REFLEX MICROSCOPIC
Bilirubin Urine: NEGATIVE
Glucose, UA: NEGATIVE mg/dL
Hgb urine dipstick: NEGATIVE
Ketones, ur: NEGATIVE mg/dL
Nitrite: POSITIVE — AB
Specific Gravity, Urine: 1.025 (ref 1.005–1.030)
pH: 6 (ref 5.0–8.0)

## 2021-02-11 LAB — CBG MONITORING, ED: Glucose-Capillary: 203 mg/dL — ABNORMAL HIGH (ref 70–99)

## 2021-02-11 LAB — LACTIC ACID, PLASMA
Lactic Acid, Venous: 3.8 mmol/L (ref 0.5–1.9)
Lactic Acid, Venous: 2.4 mmol/L (ref 0.5–1.9)

## 2021-02-11 LAB — TROPONIN I (HIGH SENSITIVITY)
Troponin I (High Sensitivity): 9 ng/L (ref <18)
Troponin I (High Sensitivity): 33 ng/L — ABNORMAL HIGH (ref <18)
Troponin I (High Sensitivity): 39 ng/L — ABNORMAL HIGH (ref <18)

## 2021-02-11 LAB — VALPROIC ACID LEVEL: Valproic Acid Lvl: 32 ug/mL — ABNORMAL LOW (ref 50.0–100.0)

## 2021-02-11 LAB — MAGNESIUM: Magnesium: 2.3 mg/dL (ref 1.7–2.4)

## 2021-02-11 MED ORDER — LIDOCAINE HCL (PF) 2 % IJ SOLN
INTRAMUSCULAR | Status: AC
  Filled 2021-02-11: qty 10

## 2021-02-11 MED ORDER — LIDOCAINE HCL (PF) 1 % IJ SOLN
INTRAMUSCULAR | Status: AC
  Administered 2021-02-11: 2.1 mL
  Filled 2021-02-11: qty 30

## 2021-02-11 MED ORDER — DIVALPROEX SODIUM 125 MG PO CSDR
250.0000 mg | DELAYED_RELEASE_CAPSULE | Freq: Once | ORAL | Status: AC
  Administered 2021-02-11: 250 mg via ORAL
  Filled 2021-02-11: qty 2

## 2021-02-11 MED ORDER — CEPHALEXIN 500 MG PO CAPS: 500.0000 mg | ORAL_CAPSULE | Freq: Four times a day (QID) | ORAL | 0 refills | Status: DC

## 2021-02-11 MED ORDER — CEFTRIAXONE SODIUM 1 G IJ SOLR
1.0000 g | Freq: Once | INTRAMUSCULAR | Status: AC
  Administered 2021-02-11: 1 g via INTRAMUSCULAR
  Filled 2021-02-11: qty 10

## 2021-02-11 NOTE — Discharge Instructions (Addendum)
Her work-up today shows that she has a urinary tract infection.  Is important that she take the antibiotic as directed until it is finished.  CT scan of her head was reassuring.  Chest x-ray shows a possible aspiration.  She has been started on antibiotics for urinary tract infection.  If she develops fever or cough, please return to the emergency department for further evaluation.  Also, I recommend that she follow-up with neurology.  I have provided outpatient resources information for the local neurologist.  Please call the office to arrange follow-up.

## 2021-02-11 NOTE — ED Notes (Signed)
c-com called at this time for transport back to [place]

## 2021-02-11 NOTE — ED Provider Notes (Signed)
[facility] EMERGENCY DEPARTMENT Provider Note   CSN: [id number] Arrival date & time: 02/11/21  1240     History Chief Complaint  Patient presents with   Seizures    Jamie [name] is a 87 y.o. female.   Seizures      Jamie [name] is a 87 y.o. female with past medical history of COPD and dementia, she resides at [facility] who presents to the emergency department via EMS for evaluation of possible seizure.  This is a level 5 caveat due to patient's history of dementia  I spoke with Elmyra Ricks,  patient's caregiver who states that at baseline patient has dementia and often babbles and has incoherent speech.  She was eating lunch at 1215 today when she began having twitching episode and appeared as though her right side stiffened and was possibly choking on food.  She was noted to have a small amount of vomiting.  Episode lasted 5 to 6 minutes.  Caregiver states that she was evaluated 2 weeks ago for similar episode.  Per nursing home staff patient has history of seizures and takes Depakote daily. No witnessed fall, patient does have dysphagia listed in her medical record.  Pt is DNR  Past Medical History:  Diagnosis Date   Adenomatous colon polyp 12/1993   Allergy    lipitor   Alzheimer disease ([place])    Anxiety    Aphasia    Cognitive communication deficit    COPD (chronic obstructive pulmonary disease) ([place])    Dementia ([place])    Diverticulosis    Dysphagia    to solids and pills .  On left side   Esophageal cancer ([place]) 05/15/2012   bx=, middle,Invasive squamous cell ca,   GERD (gastroesophageal reflux disease)    Hot flashes    Hyperlipidemia    Hypertension    MDD (major depressive disorder)    Mitral valve insufficiency    Osteoporosis    Paralytic gait    Unsteadiness on feet     Patient Active Problem List   Diagnosis Date Noted   Closed fracture of right wrist 10/01/18 12/05/2018   S/P ORIF (open reduction internal fixation) fracture     Closed fracture of subtrochanteric section of femur, right, initial encounter ([place]) 09/13/2016   History of esophageal cancer 09/13/2016   Dementia ([place]) 09/13/2016   Carcinoma of middle third of esophagus ([place]) 05/24/2012   Dysphagia    Esophageal cancer ([place]) 05/15/2012    Past Surgical History:  Procedure Laterality Date   ABDOMINAL HYSTERECTOMY  [id number]   salpingo-opherctomy 1986   ANTERIOR AND POSTERIOR VAGINAL REPAIR W/ SACROSPINOUS LIGAMENT SUSPENSION     Biopsy of Esophagus  05/15/12   Invasive Squamous Cell Carcinoma   colon polyps     hx adenomatous   COLONOSCOPY  2008   neg   EUS  06/03/2012   Procedure: UPPER ENDOSCOPIC ULTRASOUND (EUS) LINEAR;  Surgeon: Milus Banister, MD;  Location: [facility];  Service: Endoscopy;  Laterality: N/A;  +/- radial and liner    EXPLORATORY LAPAROTOMY  1985   FEMUR IM NAIL Right 09/14/2016   Procedure: INTRAMEDULLARY (IM) NAIL FEMORAL;  Surgeon: Marchia Bond, MD;  Location: [facility];  Service: Orthopedics;  Laterality: Right;   orif left elbow  05/13/08   s/p fall, olecranon fracture   TONSILLECTOMY     TUBAL LIGATION       OB History   No obstetric history on file.     Family History  Problem Relation Age of Onset   CVA Mother    Lung cancer Mother    Alzheimer's disease Mother    Cancer Mother    Kidney failure Father    Colon polyps Daughter     Social History   Tobacco Use   Smoking status: Former    Packs/day: 0.50    Years: 30.00    Pack years: 15.00    Types: Cigarettes    Quit date: 2018    Years since quitting: 4.6   Smokeless tobacco: Never  Vaping Use   Vaping Use: Never used  Substance Use Topics   Alcohol use: No   Drug use: No    Comment: 30+years smoking<1ppd    Home Medications Prior to Admission medications   Medication Sig Start Date End Date Taking? Authorizing Provider  Cholecalciferol (VITAMIN D-3) 1000 UNITS CAPS Take 1,000 Units by mouth daily.    Yes [provider]   citalopram (CELEXA) 20 MG tablet Take 20 mg by mouth daily. 01/12/21  Yes [provider]  memantine (NAMENDA) 10 MG tablet Take 10 mg by mouth 2 (two) times daily. 01/24/21  Yes [provider]  Multiple Vitamin (MULTIVITAMIN) tablet Take 1 tablet by mouth daily.   Yes [provider]  nitroGLYCERIN (NITROSTAT) 0.4 MG SL tablet Place 0.4 mg under the tongue every 5 (five) minutes as needed for chest pain.   Yes [provider]  OLANZapine (ZYPREXA) 2.5 MG tablet Take 2.5 mg by mouth at bedtime. 01/26/21  Yes [provider]  traMADol (ULTRAM) 50 MG tablet Take 25-50 mg by mouth every 6 (six) hours as needed for moderate pain. 10/03/18  Yes [provider]  acetaminophen (TYLENOL) 325 MG tablet Take 2 tablets (650 mg total) by mouth every 6 (six) hours as needed. Patient not taking: No sig reported 09/14/16   Marchia Bond, MD  Calcium Citrate (CITRACAL PO) Take 1 tablet by mouth daily. Patient not taking: No sig reported    [provider]  citalopram (CELEXA) 10 MG tablet  10/07/18   [provider]  clonazePAM (KLONOPIN) 0.5 MG tablet Take 1 tablet (0.5 mg total) by mouth 2 (two) times daily as needed for anxiety (anxiety/agitation). Patient not taking: No sig reported 09/18/16   Domenic Polite, MD  divalproex (DEPAKOTE SPRINKLE) 125 MG capsule Take 250 mg by mouth 2 (two) times daily. Patient not taking: No sig reported 02/07/21   [provider]  divalproex (DEPAKOTE SPRINKLE) 125 MG capsule  02/01/20   [provider]  divalproex (DEPAKOTE) 250 MG DR tablet  10/16/18   [provider]  enoxaparin (LOVENOX) 30 MG/0.3ML injection Inject 0.3 mLs (30 mg total) into the skin daily. Patient not taking: No sig reported 09/15/16   Marchia Bond, MD  esomeprazole ([place]) 40 MG capsule  10/06/18   [provider]  FLUoxetine (PROZAC) 10 MG capsule  03/01/20   [provider]  memantine (NAMENDA)  5 MG tablet  03/01/20   [provider]  Memantine HCl-Donepezil HCl 28-10 MG CP24 Take 1 capsule by mouth daily. Patient not taking: No sig reported    [provider]  metoprolol succinate (TOPROL-XL) 25 MG 24 hr tablet Take 25 mg by mouth daily with lunch.  Patient not taking: No sig reported    [provider]  risperiDONE (RISPERDAL) 0.25 MG tablet Take 0.25 mg by mouth daily as needed (for aggitation).  Patient not taking: No sig reported 07/27/16   [provider]    Allergies    Codeine and Lipitor [atorvastatin]  Review of Systems   Review of Systems  Unable to perform ROS: Mental status change  Neurological:  Positive for seizures.   Physical Exam Updated Vital Signs BP (!) 117/104   Pulse 61   Temp 98.5 F (36.9 C) (Rectal)   Resp 15   Ht '5\' 7"'$  (1.702 m)   Wt 69.1 kg   SpO2 96%   BMI 23.85 kg/m   Physical Exam Vitals and nursing note reviewed.  Constitutional:      Appearance: She is not toxic-appearing.  HENT:     Head: Atraumatic.     Mouth/Throat:     Mouth: Mucous membranes are moist.  Eyes:     Comments: Symmetrical, pin point pupils  Cardiovascular:     Rate and Rhythm: Normal rate and regular rhythm.     Pulses: Normal pulses.  Pulmonary:     Effort: Pulmonary effort is normal.     Breath sounds: No wheezing.     Comments: Course lung sounds bilaterally.  No rales Abdominal:     General: There is no distension.     Palpations: Abdomen is soft.     Tenderness: There is no abdominal tenderness.  Musculoskeletal:     Right lower leg: No edema.     Left lower leg: No edema.  Skin:    General: Skin is warm.     Capillary Refill: Capillary refill takes less than 2 seconds.     Findings: No erythema or rash.  Neurological:     Mental Status: She is alert.     Sensory: Sensation is intact.     Motor: Motor function is intact.    ED Results / Procedures / Treatments   Labs (all labs ordered are listed, but  only abnormal results are displayed) Labs Reviewed  COMPREHENSIVE METABOLIC PANEL - Abnormal; Notable for the following components:      Result Value   Glucose, Bld 198 (*)    BUN 25 (*)    Creatinine, Ser 1.04 (*)    Calcium 8.3 (*)    Albumin 3.1 (*)    AST 11 (*)    Total Bilirubin 0.2 (*)    GFR, Estimated 51 (*)    All other components within normal limits  CBC WITH DIFFERENTIAL/PLATELET - Abnormal; Notable for the following components:   RBC 3.74 (*)    MCV 103.5 (*)    Eosinophils Absolute 0.7 (*)    All other components within normal limits  URINALYSIS, ROUTINE W REFLEX MICROSCOPIC - Abnormal; Notable for the following components:   APPearance HAZY (*)    Protein, ur TRACE (*)    Nitrite POSITIVE (*)    Leukocytes,Ua SMALL (*)    All other components within normal limits  VALPROIC ACID LEVEL - Abnormal; Notable for the following components:   Valproic Acid Lvl 32 (*)    All other components within normal limits  LACTIC ACID, PLASMA - Abnormal; Notable for the following components:   Lactic Acid, Venous 3.8 (*)    All other components within normal limits  LACTIC ACID, PLASMA - Abnormal; Notable for the following components:   Lactic Acid, Venous 2.4 (*)    All other components within normal limits  CBG MONITORING, ED - Abnormal; Notable for the following components:   Glucose-Capillary 203 (*)    All other components within normal limits  TROPONIN I (HIGH SENSITIVITY) - Abnormal; Notable for the  following components:   Troponin I (High Sensitivity) 33 (*)    All other components within normal limits  TROPONIN I (HIGH SENSITIVITY) - Abnormal; Notable for the following components:   Troponin I (High Sensitivity) 39 (*)    All other components within normal limits  MAGNESIUM  URINALYSIS, MICROSCOPIC (REFLEX)  TROPONIN I (HIGH SENSITIVITY)    EKG EKG Interpretation  Date/Time:  Friday February 11 2021 13:26:18 EDT Ventricular Rate:  67 PR Interval:  200 QRS  Duration: 91 QT Interval:  435 QTC Calculation: 460 R Axis:   1 Text Interpretation: Sinus rhythm Probable LVH with secondary repol abnrm Confirmed by Thamas Jaegers ([id number]) on 02/11/2021 3:04:48 PM  Radiology CT HEAD WO CONTRAST  Result Date: 02/11/2021 CLINICAL DATA:  Seizure. EXAM: CT HEAD WITHOUT CONTRAST TECHNIQUE: Contiguous axial images were obtained from the base of the skull through the vertex without intravenous contrast. COMPARISON:  December 05, 2020. FINDINGS: Brain: Mild diffuse cortical atrophy is noted. Mild chronic ischemic white matter disease is noted. No mass effect or midline shift is noted. Ventricular size is within normal limits. There is no evidence of mass lesion, hemorrhage or acute infarction. Vascular: No hyperdense vessel or unexpected calcification. Skull: Normal. Negative for fracture or focal lesion. Sinuses/Orbits: No acute finding. Other: None. IMPRESSION: No acute intracranial abnormality seen. Electronically Signed   By: Marijo Conception M.D.   On: 02/11/2021 15:31   DG Chest Port 1 View  Result Date: 02/11/2021 CLINICAL DATA:  Seizure ([place]) R56.9 (ICD-10-CM) EXAM: PORTABLE CHEST 1 VIEW COMPARISON:  01/23/2021. FINDINGS: Patient rotation. No consolidation. Possible small right pleural effusion versus pleural thickening. Linear right basilar opacities, compatible with atelectasis. No visible pneumothorax. Cardiomediastinal silhouette is similar to prior when accounting for patient rotation. No acute osseous abnormality. Gaseous distension of the stomach. Polyarticular degenerative change. IMPRESSION: 1. Possible small right pleural effusion versus pleural thickening. 2. Right basilar atelectasis Electronically Signed   By: Margaretha Sheffield M.D.   On: 02/11/2021 13:59    Procedures Procedures   Medications Ordered in ED Medications  divalproex (DEPAKOTE SPRINKLE) capsule 250 mg (250 mg Oral Given 02/11/21 1445)  cefTRIAXone (ROCEPHIN) injection 1 g (1 g Intramuscular Given  02/11/21 1831)  lidocaine (PF) (XYLOCAINE) 1 % injection (2.1 mLs  Given 02/11/21 1833)    ED Course  I have reviewed the triage vital signs and the nursing notes.  Pertinent labs & imaging results that were available during my care of the patient were reviewed by me and considered in my medical decision making (see chart for details).  Patient here from [facility] home for evaluation of possible choking episode with some twitching and jerking.  Patient was here 2 weeks ago with similar symptoms.  She does take Depakote daily no documented history of seizures.   On initial exam, patient nonverbal makes eye contact when spoken to.  Non toxic appearing.  Patient hypoxic on arrival was placed on 4 L O2 by nasal cannula.  No history of supplemental oxygen requirement.  Will start seizure work up, including CT head and chest x-ray.  Patient daughter now at bedside who reports that she appears at her neurologic baseline.    States that she takes Depakote for dementia and agitation.  Chest x-ray shows possible right pleural effusion versus pleural thickening.  Unclear if this may also represent aspiration.   On recheck, patient now more alert.  Makes good eye contact speech noncoherent, mostly babbling.  No longer hypoxic.  O2 sat 95%  on room air.   1715  spoke with pt's daughter and updated her with results.  Will keep pt for third trop, waiting for U/A  1900  updated daughter on results.  Discussed care.  She is agreeable for pt to return to nursing home and close outpatient neurology f/u.  Will also treat with cephalexin for UTI, urine culture pending.  MDM Rules/Calculators/A&P                            Final Clinical Impression(s) / ED Diagnoses Final diagnoses:  Seizure [facility])  Urinary tract infection in elderly patient    Rx / DC Orders ED Discharge Orders     None        Kem Parkinson, PA-C 02/11/21 2347    Margette Fast, MD 02/15/21 1736

## 2021-02-11 NOTE — ED Triage Notes (Signed)
Pt brought in by [facility] from [facility]. Staff reported to EMS that pt was eating and then started to seize. They also reported that pt was seen at the hospital 1 week ago for a seizure.

## 2021-02-11 NOTE — ED Notes (Signed)
Date and time results received: 02/11/21 1405   Test: Lactci acid  Critical Value: 3.8  Name of Provider Notified:  [place] PA Orders Received? Or Actions Taken?: Notified

## 2021-02-11 NOTE — ED Notes (Signed)
Patient more confused, pulling leads and monitoring equipment off. Patient having to be re-directed multiple times. Noted to be mumbling. Per PA, this is her normal behavior reported by daughter.

## 2021-02-14 LAB — URINE CULTURE: Culture: 50000 — AB

## 2021-04-05 ENCOUNTER — Other Ambulatory Visit (HOSPITAL_COMMUNITY): Payer: Self-pay | Admitting: Neurology

## 2021-04-05 ENCOUNTER — Other Ambulatory Visit: Payer: Self-pay | Admitting: Neurology

## 2021-04-05 DIAGNOSIS — R569 Unspecified convulsions: Secondary | ICD-10-CM

## 2021-04-09 ENCOUNTER — Encounter (HOSPITAL_COMMUNITY): Payer: Self-pay | Admitting: Emergency Medicine

## 2021-04-09 ENCOUNTER — Emergency Department (HOSPITAL_COMMUNITY)
Admission: EM | Admit: 2021-04-09 | Discharge: 2021-04-09 | Disposition: A | Discharge status: Home / Self Care | Payer: Medicare HMO | Attending: Emergency Medicine | Admitting: Emergency Medicine

## 2021-04-09 ENCOUNTER — Other Ambulatory Visit: Payer: Self-pay

## 2021-04-09 ENCOUNTER — Emergency Department (HOSPITAL_COMMUNITY): Payer: Medicare HMO

## 2021-04-09 DIAGNOSIS — F0390 Unspecified dementia without behavioral disturbance: Secondary | ICD-10-CM | POA: Diagnosis not present

## 2021-04-09 DIAGNOSIS — S0181XA Laceration without foreign body of other part of head, initial encounter: Secondary | ICD-10-CM | POA: Diagnosis not present

## 2021-04-09 DIAGNOSIS — W050XXA Fall from non-moving wheelchair, initial encounter: Secondary | ICD-10-CM | POA: Diagnosis not present

## 2021-04-09 DIAGNOSIS — Z23 Encounter for immunization: Secondary | ICD-10-CM | POA: Diagnosis not present

## 2021-04-09 DIAGNOSIS — I10 Essential (primary) hypertension: Secondary | ICD-10-CM | POA: Diagnosis not present

## 2021-04-09 DIAGNOSIS — S199XXA Unspecified injury of neck, initial encounter: Secondary | ICD-10-CM | POA: Diagnosis present

## 2021-04-09 DIAGNOSIS — Y92129 Unspecified place in nursing home as the place of occurrence of the external cause: Secondary | ICD-10-CM | POA: Insufficient documentation

## 2021-04-09 DIAGNOSIS — J449 Chronic obstructive pulmonary disease, unspecified: Secondary | ICD-10-CM | POA: Diagnosis not present

## 2021-04-09 DIAGNOSIS — S12031A Nondisplaced posterior arch fracture of first cervical vertebra, initial encounter for closed fracture: Secondary | ICD-10-CM | POA: Insufficient documentation

## 2021-04-09 DIAGNOSIS — Z87891 Personal history of nicotine dependence: Secondary | ICD-10-CM | POA: Diagnosis not present

## 2021-04-09 DIAGNOSIS — Z8501 Personal history of malignant neoplasm of esophagus: Secondary | ICD-10-CM | POA: Diagnosis not present

## 2021-04-09 DIAGNOSIS — S0990XA Unspecified injury of head, initial encounter: Secondary | ICD-10-CM

## 2021-04-09 DIAGNOSIS — W19XXXA Unspecified fall, initial encounter: Secondary | ICD-10-CM

## 2021-04-09 DIAGNOSIS — Z79899 Other long term (current) drug therapy: Secondary | ICD-10-CM | POA: Diagnosis not present

## 2021-04-09 LAB — CBG MONITORING, ED: Glucose-Capillary: 106 mg/dL — ABNORMAL HIGH (ref 70–99)

## 2021-04-09 MED ORDER — TETANUS-DIPHTH-ACELL PERTUSSIS 5-2.5-18.5 LF-MCG/0.5 IM SUSY
0.5000 mL | PREFILLED_SYRINGE | Freq: Once | INTRAMUSCULAR | Status: AC
  Administered 2021-04-09: 0.5 mL via INTRAMUSCULAR
  Filled 2021-04-09: qty 0.5

## 2021-04-09 MED ORDER — LIDOCAINE-EPINEPHRINE (PF) 2 %-1:200000 IJ SOLN
10.0000 mL | Freq: Once | INTRAMUSCULAR | Status: AC
  Administered 2021-04-09: 10 mL
  Filled 2021-04-09: qty 20

## 2021-04-09 MED ORDER — LIDOCAINE-EPINEPHRINE-TETRACAINE (LET) TOPICAL GEL
3.0000 mL | Freq: Once | TOPICAL | Status: AC
  Administered 2021-04-09: 3 mL via TOPICAL
  Filled 2021-04-09: qty 3

## 2021-04-09 NOTE — ED Triage Notes (Signed)
Patient brought in via EMS from [place]. Alert but disoriented with incomprehensible speech. Per paramedic this is patient's normal status. Patient brought in for witnessed fall from wheelchair. Staff denied patient having LOC. Patient has laceration to forehead. Gauze dressing in place. Clean and intact at this time. Patient has not shown any signs of pain. per paramedic. Patient does not take any type of anticoagulant. Unsure of last tetanus.

## 2021-04-09 NOTE — ED Provider Notes (Signed)
Patient had a fall in the emergency department after she was discharged.  I examined the patient and she has a contusion to the right forehead.  We read CT of your head and cervical spine and did not find any new injury.  She also had some hip x-rays done because she complains of mild pain in the left hip.  The hip x-rays were negative.  She will be discharged home   Milton Ferguson, MD 04/09/21 1901

## 2021-04-09 NOTE — ED Notes (Signed)
This nurse was walking by this pts room and noted pt to be lying face down on the floor. Pt was responsive. Primary nurse notified and MD.

## 2021-04-09 NOTE — ED Notes (Signed)
Put pt on 12 lead monitor

## 2021-04-09 NOTE — ED Notes (Signed)
Went to do rounding pt gone to Radiology

## 2021-04-09 NOTE — ED Notes (Signed)
Dr Roderic Palau at bedside to assess patient.

## 2021-04-09 NOTE — ED Provider Notes (Addendum)
I was asked to suture Jamie [name]'s forehead laceration by Dr. Pearline Cables.  There is extensive my care of this patient.  LACERATION REPAIR Performed by: [name] Authorized by: [name] Consent: Verbal consent obtained. Risks and benefits: risks, benefits and alternatives were discussed Consent given by: patient Patient identity confirmed: provided demographic data Prepped and Draped in normal sterile fashion Wound explored  Laceration Location: forehead  Laceration Length: 8 cm, stellate  No Foreign Bodies seen or palpated  Anesthesia:  Regional block performed by Dr.Gray  Local anesthetic:refer to Dr. Emelia Salisbury documentation  Anesthetic total:  ml  Irrigation method: syringe Amount of cleaning: standard  Skin closure: Ethilon 6-0  Number of sutures: 7  Technique: Simple interrupted  Patient tolerance: Patient tolerated the procedure well with no immediate complications.  Assistance was required to keep patient still during the procedure.  She tolerated it relatively well.    [name], PA-C 04/09/21 1647   After pt was sutured and dispo'd, pending nursing home transfer.  She was found on the floor in her room with a new right forehead hematoma, no new laceration or disruption of initial repair.  Complaint of hip pain.  She was re-imaged, head CT negative for acute intracranial injury, large right forehead hematoma.  Hips negative for acute fracture.   Plan to dc back to nursing facility.     [name], PA-C 04/09/21 [place], [place], DO 04/11/21 [phone]

## 2021-04-09 NOTE — Discharge Instructions (Addendum)
You will need to wear the cervical collar at all times until you have been seen by the neurosurgical group listed [facility] - call for an appointment for a recheck of your cervical spine injury).  Have your sutures removed by your primary MD in 5-7 days.

## 2021-04-09 NOTE — ED Notes (Signed)
Crystal, RN attempted to call report to [facility] has been on hold 15 min and remains on hold. [facility] was unable to wait so they have left with pt.

## 2021-04-09 NOTE — ED Provider Notes (Signed)
[facility] EMERGENCY DEPARTMENT Provider Note   CSN: [id number] Arrival date & time: 04/09/21  1140     History Chief Complaint  Patient presents with   Fall    Jamie [name] is a 87 y.o. female.  This is a 87 y.o. female with significant medical history as below, including HTN, alzheimer disease who presents to the ED with complaint of fall.  Patient with garbled speech, ANO x1.  This is her baseline per EMS.  EMS/nursing home patient with a fall earlier this morning, not anticoagulated.  Fall from sitting at a wheelchair, struck her forehead on the ground.  No LOC per staff. Normal state of health prior to this.   Level 5 caveat, AMS  The history is provided by the patient and the EMS personnel. No language interpreter was used.  Fall      Past Medical History:  Diagnosis Date   Adenomatous colon polyp 12/1993   Allergy    lipitor   Alzheimer disease ([place])    Anxiety    Aphasia    Cognitive communication deficit    COPD (chronic obstructive pulmonary disease) ([place])    Dementia ([place])    Diverticulosis    Dysphagia    to solids and pills .  On left side   Esophageal cancer ([place]) 05/15/2012   bx=, middle,Invasive squamous cell ca,   GERD (gastroesophageal reflux disease)    Hot flashes    Hyperlipidemia    Hypertension    MDD (major depressive disorder)    Mitral valve insufficiency    Osteoporosis    Paralytic gait    Unsteadiness on feet     Patient Active Problem List   Diagnosis Date Noted   Closed fracture of right wrist 10/01/18 12/05/2018   S/P ORIF (open reduction internal fixation) fracture    Closed fracture of subtrochanteric section of femur, right, initial encounter ([place]) 09/13/2016   History of esophageal cancer 09/13/2016   Dementia ([place]) 09/13/2016   Carcinoma of middle third of esophagus ([place]) 05/24/2012   Dysphagia    Esophageal cancer ([place]) 05/15/2012    Past Surgical History:  Procedure Laterality Date   ABDOMINAL HYSTERECTOMY   2376,2831   salpingo-opherctomy 1986   ANTERIOR AND POSTERIOR VAGINAL REPAIR W/ SACROSPINOUS LIGAMENT SUSPENSION     Biopsy of Esophagus  05/15/12   Invasive Squamous Cell Carcinoma   colon polyps     hx adenomatous   COLONOSCOPY  2008   neg   EUS  06/03/2012   Procedure: UPPER ENDOSCOPIC ULTRASOUND (EUS) LINEAR;  Surgeon: Milus Banister, MD;  Location: [facility];  Service: Endoscopy;  Laterality: N/A;  +/- radial and liner    EXPLORATORY LAPAROTOMY  1985   FEMUR IM NAIL Right 09/14/2016   Procedure: INTRAMEDULLARY (IM) NAIL FEMORAL;  Surgeon: Marchia Bond, MD;  Location: [facility];  Service: Orthopedics;  Laterality: Right;   orif left elbow  05/13/08   s/p fall, olecranon fracture   TONSILLECTOMY     TUBAL LIGATION       OB History   No obstetric history on file.     Family History  Problem Relation Age of Onset   CVA Mother    Lung cancer Mother    Alzheimer's disease Mother    Cancer Mother    Kidney failure Father    Colon polyps Daughter     Social History   Tobacco Use   Smoking status: Former    Packs/day: 0.50    Years:  30.00    Pack years: 15.00    Types: Cigarettes    Quit date: 2018    Years since quitting: 4.8   Smokeless tobacco: Never  Vaping Use   Vaping Use: Never used  Substance Use Topics   Alcohol use: No   Drug use: No    Comment: 30+years smoking<1ppd    Home Medications Prior to Admission medications   Medication Sig Start Date End Date Taking? Authorizing Provider  acetaminophen (TYLENOL) 325 MG tablet Take 2 tablets (650 mg total) by mouth every 6 (six) hours as needed. Patient not taking: No sig reported 09/14/16   Marchia Bond, MD  Calcium Citrate (CITRACAL PO) Take 1 tablet by mouth daily. Patient not taking: No sig reported    [provider]  cephALEXin (KEFLEX) 500 MG capsule Take 1 capsule (500 mg total) by mouth 4 (four) times daily. 02/11/21   Triplett, Tammy, PA-C  Cholecalciferol (VITAMIN D-3) 1000 UNITS CAPS Take  1,000 Units by mouth daily.     [provider]  citalopram (CELEXA) 10 MG tablet  10/07/18   [provider]  citalopram (CELEXA) 20 MG tablet Take 20 mg by mouth daily. 01/12/21   [provider]  clonazePAM (KLONOPIN) 0.5 MG tablet Take 1 tablet (0.5 mg total) by mouth 2 (two) times daily as needed for anxiety (anxiety/agitation). Patient not taking: No sig reported 09/18/16   Domenic Polite, MD  divalproex (DEPAKOTE SPRINKLE) 125 MG capsule Take 250 mg by mouth 2 (two) times daily. Patient not taking: No sig reported 02/07/21   [provider]  divalproex (DEPAKOTE SPRINKLE) 125 MG capsule  02/01/20   [provider]  divalproex (DEPAKOTE) 250 MG DR tablet  10/16/18   [provider]  enoxaparin (LOVENOX) 30 MG/0.3ML injection Inject 0.3 mLs (30 mg total) into the skin daily. Patient not taking: No sig reported 09/15/16   Marchia Bond, MD  esomeprazole ([place]) 40 MG capsule  10/06/18   [provider]  FLUoxetine (PROZAC) 10 MG capsule  03/01/20   [provider]  memantine (NAMENDA) 10 MG tablet Take 10 mg by mouth 2 (two) times daily. 01/24/21   [provider]  memantine (NAMENDA) 5 MG tablet  03/01/20   [provider]  Memantine HCl-Donepezil HCl 28-10 MG CP24 Take 1 capsule by mouth daily. Patient not taking: No sig reported    [provider]  metoprolol succinate (TOPROL-XL) 25 MG 24 hr tablet Take 25 mg by mouth daily with lunch.  Patient not taking: No sig reported    [provider]  Multiple Vitamin (MULTIVITAMIN) tablet Take 1 tablet by mouth daily.    [provider]  nitroGLYCERIN (NITROSTAT) 0.4 MG SL tablet Place 0.4 mg under the tongue every 5 (five) minutes as needed for chest pain.    [provider]  OLANZapine (ZYPREXA) 2.5 MG tablet Take 2.5 mg by mouth at bedtime. 01/26/21   [provider]  risperiDONE (RISPERDAL) 0.25 MG tablet Take 0.25 mg by  mouth daily as needed (for aggitation).  Patient not taking: No sig reported 07/27/16   [provider]  traMADol (ULTRAM) 50 MG tablet Take 25-50 mg by mouth every 6 (six) hours as needed for moderate pain. 10/03/18   [provider]    Allergies    Codeine and Lipitor [atorvastatin]  Review of Systems   Review of Systems  Unable to perform ROS: Dementia   Physical Exam Updated Vital Signs BP (!) 155/52 (  BP Location: Left Arm)   Pulse 83   Temp 98.9 F (37.2 C) (Oral)   Resp 20   Ht 5\' 3"  (1.6 m)   Wt 63.5 kg   SpO2 95%   BMI 24.80 kg/m   Physical Exam Vitals and nursing note reviewed.  Constitutional:      General: She is not in acute distress.    Appearance: Normal appearance.  HENT:     Head: Normocephalic.     Comments: Forehead laceration, not currently bleeding     Right Ear: External ear normal.     Left Ear: External ear normal.     Nose: Nose normal.     Mouth/Throat:     Mouth: Mucous membranes are moist.  Eyes:     General: No scleral icterus.       Right eye: No discharge.        Left eye: No discharge.     Extraocular Movements: Extraocular movements intact.     Pupils: Pupils are equal, round, and reactive to light.  Cardiovascular:     Rate and Rhythm: Normal rate and regular rhythm.     Pulses: Normal pulses.     Heart sounds: Normal heart sounds.  Pulmonary:     Effort: Pulmonary effort is normal. No respiratory distress.     Breath sounds: Normal breath sounds.  Abdominal:     General: Abdomen is flat.     Tenderness: There is no abdominal tenderness.  Musculoskeletal:        General: Normal range of motion.     Cervical back: Normal range of motion.     Right lower leg: No edema.     Left lower leg: No edema.  Skin:    General: Skin is warm and dry.     Capillary Refill: Capillary refill takes less than 2 seconds.  Neurological:     Mental Status: She is alert. Mental status is at baseline. She is confused.     GCS:  GCS eye subscore is 4. GCS verbal subscore is 3. GCS motor subscore is 6.     Comments: Moves all 4 extremities spontaneously, withdraws to pain 4 extremities   Psychiatric:        Mood and Affect: Mood normal.        Behavior: Behavior normal.    ED Results / Procedures / Treatments   Labs (all labs ordered are listed, but only abnormal results are displayed) Labs Reviewed  CBG MONITORING, ED - Abnormal; Notable for the following components:      Result Value   Glucose-Capillary 106 (*)    All other components within normal limits    EKG None  Radiology DG Chest 1 View  Result Date: 04/09/2021 CLINICAL DATA:  [place]. EXAM: CHEST  1 VIEW COMPARISON:  Chest x-ray 02/11/2021 FINDINGS: The heart is within normal limits in size for age and unchanged. Stable tortuosity and calcification of the thoracic aorta. Right basilar scarring changes and mild chronic pleural thickening. No acute pulmonary findings. The bony thorax is grossly intact. IMPRESSION: No acute cardiopulmonary findings. Electronically Signed   By: Marijo Sanes M.D.   On: 04/09/2021 13:06   DG Pelvis 1-2 Views  Result Date: 04/09/2021 CLINICAL DATA:  [place].  Pelvic pain. EXAM: PELVIS - 1-2 VIEW COMPARISON:  12/05/2020 FINDINGS: Both hips are normally located. No acute hip fracture. Right hip hardware is intact. The pubic symphysis and SI joints are intact. No definite pelvic fractures. Stable vascular calcifications. IMPRESSION:  No acute bony findings. Electronically Signed   By: Marijo Sanes M.D.   On: 04/09/2021 13:04   CT Head Wo Contrast  Result Date: 04/09/2021 CLINICAL DATA:  [place].  Mental status change. EXAM: CT HEAD WITHOUT CONTRAST CT MAXILLOFACIAL WITHOUT CONTRAST TECHNIQUE: Multidetector CT imaging of the head and maxillofacial structures were performed using the standard protocol without intravenous contrast. Multiplanar CT image reconstructions of the maxillofacial structures were also generated. COMPARISON:   Head CT 02/11/2021 FINDINGS: CT HEAD FINDINGS Brain: Stable advanced cerebral atrophy, ventriculomegaly and periventricular white matter disease. Stable encephalomalacia in the left temporal lobe ex vacuo dilatation the temporal horn. No extra-axial fluid collections are identified. No CT findings for acute hemispheric infarction or intracranial hemorrhage. No mass lesions. The brainstem and cerebellum are normal. Vascular: Stable vascular calcifications. No aneurysm or hyperdense vessels. Skull: No acute skull fracture.  No bone lesions. Other: No scalp lesions or scalp hematoma. CT MAXILLOFACIAL FINDINGS Osseous: No acute facial bone fractures are identified. The mandibular condyles are normally located. No mandible fracture. On the reformatted images there is a small nondisplaced fracture involving the posterior arch of C1 on the right side. In retrospect think this is visible on the cervical spine CT scan also. Orbits: No orbital bone fractures. The globes are intact. No intraorbital hematoma. Sinuses: The paranasal sinuses and mastoid air cells are clear. The globes are intact. Soft tissues: No significant soft tissue abnormality. IMPRESSION: 1. Stable advanced cerebral atrophy, ventriculomegaly and periventricular white matter disease. 2. Small nondisplaced fracture involving the posterior arch of C1 on right side. 3. Stable encephalomalacia in the left temporal lobe. 4. No acute intracranial findings or skull fracture. 5. No acute facial bone fractures. Electronically Signed   By: Marijo Sanes M.D.   On: 04/09/2021 13:03   CT Cervical Spine Wo Contrast  Result Date: 04/09/2021 CLINICAL DATA:  Trauma EXAM: CT CERVICAL SPINE WITHOUT CONTRAST TECHNIQUE: Multidetector CT imaging of the cervical spine was performed without intravenous contrast. Multiplanar CT image reconstructions were also generated. COMPARISON:  12/07/2020 FINDINGS: Alignment: Alignment of posterior margins of vertebral bodies is  unremarkable Skull base and vertebrae: No recent fracture is seen. Degenerative changes are noted with disc space narrowing, bony spurs and encroachment of neural foramina at multiple levels. Osteopenia is seen in bony structures. Soft tissues and spinal canal: Prevertebral soft tissues are unremarkable. Thyroid is not distinctly seen. Scattered arterial calcifications are seen. Disc levels: Degenerative changes are noted with disc space narrowing, bony spurs and encroachment of neural foramina at multiple levels. Upper chest: Linear density in the left apex may suggest scarring. There is possible infiltrate in the posterior right upper lung field which was not seen in the previous study. Other: None IMPRESSION: No recent fracture is seen in cervical spine. Alignment of posterior margins of vertebral bodies is unremarkable. Degenerative changes are noted with encroachment of neural foramina at multiple levels. There is new pleural-based density in the posterior right upper lung fields suggesting atelectasis/pneumonia or scarring. Linear density in the left apex has not changed significantly suggesting scarring. Electronically Signed   By: Elmer Picker M.D.   On: 04/09/2021 12:46   CT Maxillofacial Wo Contrast  Result Date: 04/09/2021 CLINICAL DATA:  [place].  Mental status change. EXAM: CT HEAD WITHOUT CONTRAST CT MAXILLOFACIAL WITHOUT CONTRAST TECHNIQUE: Multidetector CT imaging of the head and maxillofacial structures were performed using the standard protocol without intravenous contrast. Multiplanar CT image reconstructions of the maxillofacial structures were also generated. COMPARISON:  Head CT  02/11/2021 FINDINGS: CT HEAD FINDINGS Brain: Stable advanced cerebral atrophy, ventriculomegaly and periventricular white matter disease. Stable encephalomalacia in the left temporal lobe ex vacuo dilatation the temporal horn. No extra-axial fluid collections are identified. No CT findings for acute hemispheric  infarction or intracranial hemorrhage. No mass lesions. The brainstem and cerebellum are normal. Vascular: Stable vascular calcifications. No aneurysm or hyperdense vessels. Skull: No acute skull fracture.  No bone lesions. Other: No scalp lesions or scalp hematoma. CT MAXILLOFACIAL FINDINGS Osseous: No acute facial bone fractures are identified. The mandibular condyles are normally located. No mandible fracture. On the reformatted images there is a small nondisplaced fracture involving the posterior arch of C1 on the right side. In retrospect think this is visible on the cervical spine CT scan also. Orbits: No orbital bone fractures. The globes are intact. No intraorbital hematoma. Sinuses: The paranasal sinuses and mastoid air cells are clear. The globes are intact. Soft tissues: No significant soft tissue abnormality. IMPRESSION: 1. Stable advanced cerebral atrophy, ventriculomegaly and periventricular white matter disease. 2. Small nondisplaced fracture involving the posterior arch of C1 on right side. 3. Stable encephalomalacia in the left temporal lobe. 4. No acute intracranial findings or skull fracture. 5. No acute facial bone fractures. Electronically Signed   By: Marijo Sanes M.D.   On: 04/09/2021 13:03    Procedures Procedures   Medications Ordered in ED Medications  Tdap (BOOSTRIX) injection 0.5 mL (0.5 mLs Intramuscular Given 04/09/21 1352)  lidocaine-EPINEPHrine (XYLOCAINE W/EPI) 2 %-1:200000 (PF) injection 10 mL (10 mLs Other Given by Other 04/09/21 1457)  lidocaine-EPINEPHrine-tetracaine (LET) topical gel (3 mLs Topical Given 04/09/21 1454)    ED Course  I have reviewed the triage vital signs and the nursing notes.  Pertinent labs & imaging results that were available during my care of the patient were reviewed by me and considered in my medical decision making (see chart for details).    MDM Rules/Calculators/A&P                           CC: fall  This patient complains of  fall; this involves an extensive number of treatment options and is a complaint that carries with it a high risk of complications and morbidity. Vital signs were reviewed. Serious etiologies considered.  Patient at baseline per EMS/nursing home   Record review:  Previous records obtained and reviewed   Work up as above, notable for:  Labs & imaging results that were available during my care of the patient were reviewed by me and considered in my medical decision making.   I ordered imaging studies which included CTH Cspine, Max/face and I independently visualized and interpreted imaging which showed C1 posterior arch fracture  Gust with neurosurgery regarding C1 posterior arch fracture, recommend c-collar, outpatient follow-up.  Management: Patient with forehead laceration, given tetanus shot  Laceration to be repaired by PA Idol.   Reassessment:  Patient to be placed in c-collar, outpatient follow-up with neurosurgery.  Plan to discharge back to nursing facility.        This chart was dictated using voice recognition software.  Despite best efforts to proofread,  errors can occur which can change the documentation meaning.  Final Clinical Impression(s) / ED Diagnoses Final diagnoses:  Closed nondisplaced fracture of posterior arch of first cervical vertebra, initial encounter ([place])  Fall, initial encounter  Injury of head, initial encounter  Facial laceration, initial encounter    Rx / DC Orders ED  Discharge Orders     None        Jeanell Sparrow, DO 04/09/21 1606

## 2021-04-09 NOTE — ED Notes (Signed)
Replaced purewick it was soiled

## 2021-04-09 NOTE — ED Notes (Addendum)
Patient found by staff lying on floor on right side and with face down. Patient alert and mental status unchanged from arrival. EDP notified immediately and came to room to assess. Patient secured and c-collar placed.  Moderate amount of bleeding noted from freshly placed sutures/sutures intact. Patient has approx 3-4cm hematoma to right side of forehead which is new. Patient log rolled onto back board and moved back onto stretcher with EDP's approval. Patient placed on montior and bed alarm set.

## 2021-04-27 ENCOUNTER — Ambulatory Visit (HOSPITAL_COMMUNITY)
Admission: RE | Admit: 2021-04-27 | Discharge: 2021-04-27 | Disposition: A | Discharge status: Home / Self Care | Payer: Medicare HMO | Source: Ambulatory Visit | Attending: Neurology | Admitting: Neurology

## 2021-04-27 ENCOUNTER — Other Ambulatory Visit: Payer: Self-pay

## 2021-04-27 DIAGNOSIS — R569 Unspecified convulsions: Secondary | ICD-10-CM | POA: Diagnosis present

## 2021-04-27 LAB — POCT I-STAT CREATININE: Creatinine, Ser: 0.9 mg/dL (ref 0.44–1.00)

## 2021-04-27 MED ORDER — IOHEXOL 300 MG/ML  SOLN
75.0000 mL | Freq: Once | INTRAMUSCULAR | Status: AC | PRN
  Administered 2021-04-27: 75 mL via INTRAVENOUS

## 2022-08-10 ENCOUNTER — Encounter: Payer: Self-pay | Admitting: Radiology

## 2023-07-24 ENCOUNTER — Encounter (HOSPITAL_COMMUNITY): Payer: Self-pay | Admitting: Emergency Medicine

## 2023-07-24 ENCOUNTER — Emergency Department (HOSPITAL_COMMUNITY): Payer: Medicare HMO

## 2023-07-24 ENCOUNTER — Other Ambulatory Visit: Payer: Self-pay

## 2023-07-24 ENCOUNTER — Observation Stay (HOSPITAL_COMMUNITY)
Admission: EM | Admit: 2023-07-24 | Discharge: 2023-08-11 | Disposition: E | Payer: Medicare HMO | Attending: Student | Admitting: Student

## 2023-07-24 DIAGNOSIS — R3989 Other symptoms and signs involving the genitourinary system: Secondary | ICD-10-CM | POA: Insufficient documentation

## 2023-07-24 DIAGNOSIS — Z87891 Personal history of nicotine dependence: Secondary | ICD-10-CM | POA: Diagnosis not present

## 2023-07-24 DIAGNOSIS — Z79899 Other long term (current) drug therapy: Secondary | ICD-10-CM | POA: Diagnosis not present

## 2023-07-24 DIAGNOSIS — R739 Hyperglycemia, unspecified: Secondary | ICD-10-CM | POA: Insufficient documentation

## 2023-07-24 DIAGNOSIS — J101 Influenza due to other identified influenza virus with other respiratory manifestations: Secondary | ICD-10-CM | POA: Insufficient documentation

## 2023-07-24 DIAGNOSIS — Z8501 Personal history of malignant neoplasm of esophagus: Secondary | ICD-10-CM | POA: Diagnosis not present

## 2023-07-24 DIAGNOSIS — N179 Acute kidney failure, unspecified: Secondary | ICD-10-CM | POA: Diagnosis not present

## 2023-07-24 DIAGNOSIS — Z515 Encounter for palliative care: Secondary | ICD-10-CM

## 2023-07-24 DIAGNOSIS — F0390 Unspecified dementia without behavioral disturbance: Secondary | ICD-10-CM | POA: Diagnosis not present

## 2023-07-24 DIAGNOSIS — Z20822 Contact with and (suspected) exposure to covid-19: Secondary | ICD-10-CM | POA: Insufficient documentation

## 2023-07-24 DIAGNOSIS — E870 Hyperosmolality and hypernatremia: Secondary | ICD-10-CM | POA: Diagnosis not present

## 2023-07-24 DIAGNOSIS — J449 Chronic obstructive pulmonary disease, unspecified: Secondary | ICD-10-CM | POA: Insufficient documentation

## 2023-07-24 DIAGNOSIS — R4182 Altered mental status, unspecified: Secondary | ICD-10-CM | POA: Diagnosis present

## 2023-07-24 DIAGNOSIS — N19 Unspecified kidney failure: Secondary | ICD-10-CM

## 2023-07-24 DIAGNOSIS — I10 Essential (primary) hypertension: Secondary | ICD-10-CM | POA: Diagnosis not present

## 2023-07-24 DIAGNOSIS — J9601 Acute respiratory failure with hypoxia: Secondary | ICD-10-CM | POA: Diagnosis not present

## 2023-07-24 LAB — LACTIC ACID, PLASMA
Lactic Acid, Venous: 2.6 mmol/L (ref 0.5–1.9)
Lactic Acid, Venous: 3.1 mmol/L (ref 0.5–1.9)
Lactic Acid, Venous: 2.5 mmol/L (ref 0.5–1.9)

## 2023-07-24 LAB — RESP PANEL BY RT-PCR (RSV, FLU A&B, COVID)  RVPGX2
Influenza A by PCR: POSITIVE — AB
Influenza B by PCR: NEGATIVE
Resp Syncytial Virus by PCR: NEGATIVE
SARS Coronavirus 2 by RT PCR: NEGATIVE

## 2023-07-24 LAB — URINALYSIS, W/ REFLEX TO CULTURE (INFECTION SUSPECTED)
Bilirubin Urine: NEGATIVE
Glucose, UA: NEGATIVE mg/dL
Ketones, ur: NEGATIVE mg/dL
Nitrite: NEGATIVE
Protein, ur: 100 mg/dL — AB
Specific Gravity, Urine: 1.014 (ref 1.005–1.030)
WBC, UA: >50 WBC/hpf (ref 0–5)
pH: 7 (ref 5.0–8.0)

## 2023-07-24 LAB — COMPREHENSIVE METABOLIC PANEL
ALT: 28 U/L (ref 0–44)
AST: 21 U/L (ref 15–41)
Albumin: 2.8 g/dL — ABNORMAL LOW (ref 3.5–5.0)
Alkaline Phosphatase: 80 U/L (ref 38–126)
Anion gap: 23 — ABNORMAL HIGH (ref 5–15)
BUN: 133 mg/dL — ABNORMAL HIGH (ref 8–23)
CO2: 21 mmol/L — ABNORMAL LOW (ref 22–32)
Calcium: 8.9 mg/dL (ref 8.9–10.3)
Chloride: 121 mmol/L — ABNORMAL HIGH (ref 98–111)
Creatinine, Ser: 3.89 mg/dL — ABNORMAL HIGH (ref 0.44–1.00)
GFR, Estimated: 10 mL/min — ABNORMAL LOW (ref >60)
Glucose, Bld: 186 mg/dL — ABNORMAL HIGH (ref 70–99)
Potassium: 4.1 mmol/L (ref 3.5–5.1)
Sodium: 165 mmol/L (ref 135–145)
Total Bilirubin: 1.1 mg/dL (ref 0.0–1.2)
Total Protein: 8.3 g/dL — ABNORMAL HIGH (ref 6.5–8.1)

## 2023-07-24 LAB — CBC WITH DIFFERENTIAL/PLATELET
Abs Immature Granulocytes: 0.2 10*3/uL — ABNORMAL HIGH (ref 0.00–0.07)
Basophils Absolute: 0.1 10*3/uL (ref 0.0–0.1)
Basophils Relative: 0 %
Eosinophils Absolute: 0 10*3/uL (ref 0.0–0.5)
Eosinophils Relative: 0 %
HCT: 49.5 % — ABNORMAL HIGH (ref 36.0–46.0)
Hemoglobin: 15.2 g/dL — ABNORMAL HIGH (ref 12.0–15.0)
Immature Granulocytes: 1 %
Lymphocytes Relative: 4 %
Lymphs Abs: 1.2 10*3/uL (ref 0.7–4.0)
MCH: 31.3 pg (ref 26.0–34.0)
MCHC: 30.7 g/dL (ref 30.0–36.0)
MCV: 101.9 fL — ABNORMAL HIGH (ref 80.0–100.0)
Monocytes Absolute: 0.7 10*3/uL (ref 0.1–1.0)
Monocytes Relative: 3 %
Neutro Abs: 24.6 10*3/uL — ABNORMAL HIGH (ref 1.7–7.7)
Neutrophils Relative %: 92 %
Platelets: 317 10*3/uL (ref 150–400)
RBC: 4.86 MIL/uL (ref 3.87–5.11)
RDW: 14.4 % (ref 11.5–15.5)
Smear Review: ADEQUATE
WBC: 26.8 10*3/uL — ABNORMAL HIGH (ref 4.0–10.5)
nRBC: 0.1 % (ref 0.0–0.2)

## 2023-07-24 LAB — APTT: aPTT: 30 s (ref 24–36)

## 2023-07-24 LAB — PROTIME-INR
INR: 1.5 — ABNORMAL HIGH (ref 0.8–1.2)
Prothrombin Time: 18.6 s — ABNORMAL HIGH (ref 11.4–15.2)

## 2023-07-24 MED ORDER — GLYCOPYRROLATE 1 MG PO TABS: 1.0000 mg | ORAL_TABLET | ORAL | Status: DC | PRN

## 2023-07-24 MED ORDER — ACETAMINOPHEN 325 MG PO TABS: 650.0000 mg | ORAL_TABLET | Freq: Four times a day (QID) | ORAL | Status: DC | PRN

## 2023-07-24 MED ORDER — GLYCOPYRROLATE 0.2 MG/ML IJ SOLN: 0.2000 mg | INTRAMUSCULAR | Status: DC | PRN

## 2023-07-24 MED ORDER — LORAZEPAM 2 MG/ML IJ SOLN
2.0000 mg | INTRAMUSCULAR | Status: DC | PRN
  Administered 2023-07-24: 2 mg via INTRAVENOUS
  Filled 2023-07-24: qty 1

## 2023-07-24 MED ORDER — ONDANSETRON HCL 4 MG PO TABS: 4.0000 mg | ORAL_TABLET | Freq: Four times a day (QID) | ORAL | Status: DC | PRN

## 2023-07-24 MED ORDER — MORPHINE SULFATE (PF) 2 MG/ML IV SOLN
2.0000 mg | INTRAVENOUS | Status: DC | PRN
  Administered 2023-07-24: 2 mg via INTRAVENOUS
  Administered 2023-07-24 (×3): 4 mg via INTRAVENOUS
  Filled 2023-07-24 (×3): qty 2
  Filled 2023-07-24 (×2): qty 1

## 2023-07-24 MED ORDER — VANCOMYCIN HCL IN DEXTROSE 1-5 GM/200ML-% IV SOLN: 1000.0000 mg | Freq: Once | INTRAVENOUS | Status: DC

## 2023-07-24 MED ORDER — ACETAMINOPHEN 650 MG RE SUPP
650.0000 mg | Freq: Four times a day (QID) | RECTAL | Status: DC | PRN
  Administered 2023-07-24: 650 mg via RECTAL
  Filled 2023-07-24: qty 1

## 2023-07-24 MED ORDER — METRONIDAZOLE 500 MG/100ML IV SOLN: 500.0000 mg | Freq: Once | INTRAVENOUS | Status: DC

## 2023-07-24 MED ORDER — SODIUM CHLORIDE 0.9 % IV SOLN: 2.0000 g | Freq: Once | INTRAVENOUS | Status: DC

## 2023-07-24 MED ORDER — POLYVINYL ALCOHOL 1.4 % OP SOLN: 1.0000 [drp] | Freq: Four times a day (QID) | OPHTHALMIC | Status: DC | PRN

## 2023-07-24 MED ORDER — LACTATED RINGERS IV BOLUS (SEPSIS): 1000.0000 mL | Freq: Once | INTRAVENOUS | Status: DC

## 2023-07-24 MED ORDER — IPRATROPIUM-ALBUTEROL 0.5-2.5 (3) MG/3ML IN SOLN: 3.0000 mL | Freq: Four times a day (QID) | RESPIRATORY_TRACT | Status: DC | PRN

## 2023-07-24 MED ORDER — ONDANSETRON HCL 4 MG/2ML IJ SOLN: 4.0000 mg | Freq: Four times a day (QID) | INTRAMUSCULAR | Status: DC | PRN

## 2023-07-24 NOTE — Sepsis Progress Note (Signed)
Elink monitoring for the code sepsis protocol.

## 2023-07-24 NOTE — H&P (Addendum)
[facility] History and Physical   Patient: Jamie [name] JWJ:[id number]   PCP: Charlynne Pander, MD DOB: 1928/06/20   DOA: 07/24/2023   DOS: 07/24/2023   DOS: the patient was seen and examined on 07/24/2023  Patient coming from: The patient is coming from SNF  Chief Complaint: Hypoxic respiratory failure due to influenza A virus infection  HPI: Jamie [name] is a 89 y.o. female with Past medical history of dementia, remote history of esophageal cancer and dysphagia, as reviewed from EMR, presented with fatigue and altered mental status.  Patient was at SNF, noticed decreased oral intake, fatigue and AMS and was diagnosed with flu.  Patient's condition deteriorated and she was hypoxic, she was sent to ED.  Patient was DNR/DNI, management plan discussed by ED physician with the daughter and she agreed with the comfort measures only.  TOC was involved but patient could not be placed from the ED to the hospice care today.  TRH consulted for admission and further management as below.  ED Course: VS febrile T max 100.8, HR 115, RR 14, BP 125/40, 75% on room air BMP sodium 165 CO2 21, BG 186, AKI creatinine 3.8, lactic acid 3.1 elevated Leukocytosis, WBC 26.8 elevated Influenza A positive Negative COVID and RSV CXR: Increased interstitial markings, nonspecific. No acute cardiopulmonary abnormality.    Review of Systems: as mentioned in the history of present illness.  All other systems reviewed and are negative.  Past Medical History:  Diagnosis Date   Adenomatous colon polyp 12/1993   Allergy    lipitor   Alzheimer disease (HCC)    Anxiety    Aphasia    Cognitive communication deficit    COPD (chronic obstructive pulmonary disease) (HCC)    Dementia (HCC)    Diverticulosis    Dysphagia    to solids and pills .  On left side   Esophageal cancer (HCC) 05/15/2012   bx=, middle,Invasive squamous cell ca,   GERD (gastroesophageal reflux disease)    Hot flashes     Hyperlipidemia    Hypertension    MDD (major depressive disorder)    Mitral valve insufficiency    Osteoporosis    Paralytic gait    Unsteadiness on feet    Past Surgical History:  Procedure Laterality Date   ABDOMINAL HYSTERECTOMY  6213,0865   salpingo-opherctomy 1986   ANTERIOR AND POSTERIOR VAGINAL REPAIR W/ SACROSPINOUS LIGAMENT SUSPENSION     Biopsy of Esophagus  05/15/12   Invasive Squamous Cell Carcinoma   colon polyps     hx adenomatous   COLONOSCOPY  2008   neg   EUS  06/03/2012   Procedure: UPPER ENDOSCOPIC ULTRASOUND (EUS) LINEAR;  Surgeon: Rachael Fee, MD;  Location: [facility];  Service: Endoscopy;  Laterality: N/A;  +/- radial and liner    EXPLORATORY LAPAROTOMY  1985   FEMUR IM NAIL Right 09/14/2016   Procedure: INTRAMEDULLARY (IM) NAIL FEMORAL;  Surgeon: Teryl Lucy, MD;  Location: [facility] OR;  Service: Orthopedics;  Laterality: Right;   orif left elbow  05/13/08   s/p fall, olecranon fracture   TONSILLECTOMY     TUBAL LIGATION     Social History:  reports that she quit smoking about 7 years ago. Her smoking use included cigarettes. She started smoking about 37 years ago. She has a 15 pack-year smoking history. She has never used smokeless tobacco. She reports that she does not drink alcohol and does not use drugs.  Allergies  Allergen Reactions   Codeine  Other (See Comments)    Unable to take   Lipitor [Atorvastatin] Other (See Comments)    Aching, musculoskeletal     Family history reviewed and not pertinent Family History  Problem Relation Age of Onset   CVA Mother    Lung cancer Mother    Alzheimer's disease Mother    Cancer Mother    Kidney failure Father    Colon polyps Daughter      Prior to Admission medications   Medication Sig Start Date End Date Taking? Authorizing Provider  acetaminophen (TYLENOL) 325 MG tablet Take 2 tablets (650 mg total) by mouth every 6 (six) hours as needed. 09/14/16   Teryl Lucy, MD  Calcium Citrate (CITRACAL  PO) Take 1 tablet by mouth daily. Patient not taking: No sig reported    [provider]  cephALEXin (KEFLEX) 500 MG capsule Take 1 capsule (500 mg total) by mouth 4 (four) times daily. Patient not taking: No sig reported 02/11/21   Triplett, Tammy, PA-C  Cholecalciferol (VITAMIN D-3) 1000 UNITS CAPS Take 1,000 Units by mouth daily.  Patient not taking: Reported on 04/09/2021    [provider]  citalopram (CELEXA) 10 MG tablet  10/07/18   [provider]  citalopram (CELEXA) 20 MG tablet Take 20 mg by mouth daily. Patient not taking: Reported on 04/09/2021 01/12/21   [provider]  clonazePAM (KLONOPIN) 0.5 MG tablet Take 1 tablet (0.5 mg total) by mouth 2 (two) times daily as needed for anxiety (anxiety/agitation). Patient not taking: Reported on 04/09/2021 09/18/16   Zannie Cove, MD  divalproex (DEPAKOTE SPRINKLE) 125 MG capsule Take 250 mg by mouth 2 (two) times daily. 02/07/21   [provider]  divalproex (DEPAKOTE SPRINKLE) 125 MG capsule  02/01/20   [provider]  divalproex (DEPAKOTE) 250 MG DR tablet  10/16/18   [provider]  enoxaparin (LOVENOX) 30 MG/0.3ML injection Inject 0.3 mLs (30 mg total) into the skin daily. Patient not taking: No sig reported 09/15/16   Teryl Lucy, MD  esomeprazole (NEXIUM) 40 MG capsule  10/06/18   [provider]  FLUoxetine (PROZAC) 10 MG capsule  03/01/20   [provider]  memantine (NAMENDA) 10 MG tablet Take 10 mg by mouth 2 (two) times daily. 01/24/21   [provider]  memantine (NAMENDA) 5 MG tablet  03/01/20   [provider]  Memantine HCl-Donepezil HCl 28-10 MG CP24 Take 1 capsule by mouth daily. Patient not taking: No sig reported    [provider]  metoprolol succinate (TOPROL-XL) 25 MG 24 hr tablet Take 25 mg by mouth daily with lunch.    [provider]  Multiple Vitamin (MULTIVITAMIN) tablet Take 1 tablet by mouth daily.     [provider]  nitroGLYCERIN (NITROSTAT) 0.4 MG SL tablet Place 0.4 mg under the tongue every 5 (five) minutes as needed for chest pain.    [provider]  OLANZapine (ZYPREXA) 2.5 MG tablet Take 2.5 mg by mouth at bedtime. 01/26/21   [provider]  risperiDONE (RISPERDAL) 0.25 MG tablet Take 0.25 mg by mouth daily as needed (for aggitation).  Patient not taking: No sig reported 07/27/16   [provider]  traMADol (ULTRAM) 50 MG tablet Take 25 mg by mouth every 6 (six) hours as needed for moderate pain. 10/03/18   [provider]    Physical Exam: Vitals:   07/24/23 1127 07/24/23 1500 07/24/23 1544 07/24/23 1548  BP:  107/73 114/60   Pulse:  Marland Kitchen)  115 71   Resp:  (!) 39 (!) 27   Temp:   100 F (37.8 C)   TempSrc:   Rectal   SpO2: (!) 80% 100% 100%   Weight:    46.4 kg  Height:    5\' 3"  (1.6 m)    General: stuperous and not oriented to time, place, and person. Appear in no distress. Eyes: closed,  ENT: Oral Mucosa dry  Neck: no JVD, no Abnormal Mass Or lumps Cardiovascular: S1 and S2 Present, no Murmur, peripheral pulses symmetrical Respiratory: increased respiratory effort, Bilateral Air entry equal and Decreased, no signs of accessory muscle use, mild Crackles, no wheezes Abdomen: Bowel Sound present, Soft and no tenderness.  Extremities: no Pedal edema. Neurologic: Patient was unable to follow commands, could not do neuroexam  Data Reviewed: I have personally reviewed and interpreted labs, imaging as discussed below.  CBC: Recent Labs  Lab 07/24/23 1136  WBC 26.8*  NEUTROABS 24.6*  HGB 15.2*  HCT 49.5*  MCV 101.9*  PLT 317   Basic Metabolic Panel: Recent Labs  Lab 07/24/23 1136  NA 165*  K 4.1  CL 121*  CO2 21*  GLUCOSE 186*  BUN 133*  CREATININE 3.89*  CALCIUM 8.9   GFR: Estimated Creatinine Clearance: 6.8 mL/min (A) (by C-G formula based on SCr of 3.89 mg/dL (H)). Liver Function Tests: Recent Labs  Lab  07/24/23 1136  AST 21  ALT 28  ALKPHOS 80  BILITOT 1.1  PROT 8.3*  ALBUMIN 2.8*   No results for input(s): "LIPASE", "AMYLASE" in the last 168 hours. No results for input(s): "AMMONIA" in the last 168 hours. Coagulation Profile: Recent Labs  Lab 07/24/23 1136  INR 1.5*   Cardiac Enzymes: No results for input(s): "CKTOTAL", "CKMB", "CKMBINDEX", "TROPONINI" in the last 168 hours. BNP (last 3 results) No results for input(s): "PROBNP" in the last 8760 hours. HbA1C: No results for input(s): "HGBA1C" in the last 72 hours. CBG: No results for input(s): "GLUCAP" in the last 168 hours. Lipid Profile: No results for input(s): "CHOL", "HDL", "LDLCALC", "TRIG", "CHOLHDL", "LDLDIRECT" in the last 72 hours. Thyroid Function Tests: No results for input(s): "TSH", "T4TOTAL", "FREET4", "T3FREE", "THYROIDAB" in the last 72 hours. Anemia Panel: No results for input(s): "VITAMINB12", "FOLATE", "FERRITIN", "TIBC", "IRON", "RETICCTPCT" in the last 72 hours. Urine analysis:    Component Value Date/Time   COLORURINE YELLOW 02/11/2021 1600   APPEARANCEUR HAZY (A) 02/11/2021 1600   LABSPEC 1.025 02/11/2021 1600   PHURINE 6.0 02/11/2021 1600   GLUCOSEU NEGATIVE 02/11/2021 1600   HGBUR NEGATIVE 02/11/2021 1600   BILIRUBINUR NEGATIVE 02/11/2021 1600   KETONESUR NEGATIVE 02/11/2021 1600   PROTEINUR TRACE (A) 02/11/2021 1600   NITRITE POSITIVE (A) 02/11/2021 1600   LEUKOCYTESUR SMALL (A) 02/11/2021 1600    Radiological Exams on Admission: DG Chest Portable 1 View Result Date: 07/24/2023 CLINICAL DATA:  Sepsis.  Fatigue. EXAM: PORTABLE CHEST 1 VIEW COMPARISON:  04/09/2021. FINDINGS: There are increased interstitial markings, which is nonspecific but appear grossly similar to the prior study. No frank pulmonary edema. No dense consolidation or lung collapse. No large pleural effusion or pneumothorax seen within the limitations of this exam. Stable cardio-mediastinal silhouette. No acute osseous  abnormalities. The soft tissues are within normal limits. IMPRESSION: Increased interstitial markings, nonspecific. No acute cardiopulmonary abnormality. Electronically Signed   By: Jules Schick M.D.   On: 07/24/2023 13:55   EKG: Independently reviewed.  Irregular rhythm, SVTs heart rate 144   I reviewed all nursing notes,  pharmacy notes, vitals, pertinent old records.  Assessment/Plan Principal Problem:   Comfort measures only status  Comfort measures only status Continue morphine, and symptomatic treatment Follow TOC for hospice placement  Severe sepsis, multiorgan failure Influenza A viral infection Fever, tachycardia, tachypnea and leukocytosis, hypoxic respiratory failure, elevated lactic acid 3.1  Acute hypoxic failure secondary to influenza A viral infection Continue supplemental oxygen DuoNeb as needed Continue comfort measures  Hypernatremia due to dehydration Hyperglycemia, BG 186 AKI, creatinine 3.8    DVT Prophylaxis: Comfort care  Advance goals of care discussion: DNR/DNI, comfort care  Consults: none  Family Communication: family was not present at bedside, at the time of interview.  Patient was completely obtunded.   Disposition: Admitted as observation, med-surge unit. Likely to be discharged to Hospice tomorrow a.m.  I have discussed plan of care as described above with RN and patient/family.  Severity of Illness: The appropriate patient status for this patient is OBSERVATION. Observation status is judged to be reasonable and necessary in order to provide the required intensity of service to ensure the patient's safety. The patient's presenting symptoms, physical exam findings, and initial radiographic and laboratory data in the context of their medical condition is felt to place them at decreased risk for further clinical deterioration. Furthermore, it is anticipated that the patient will be medically stable for discharge from the hospital within 2  midnights of admission.    Author: Gillis Santa, MD [facility] 07/24/2023 4:41 PM   To reach On-call, see care teams to locate the attending and reach out to them via [email or web address]. If 7PM-7AM, please contact night-coverage If you still have difficulty reaching the attending provider, please page the [facility] (Director on Call) for [facility] on amion for assistance.

## 2023-07-24 NOTE — ED Notes (Addendum)
Transition of Care [facility]) - Emergency Department Mini Assessment   Patient Details  Name: Jamie [name] MRN: [id number] Date of Birth: 07-26-1928  Transition of [facility] CM/SW Contact:    Isabella Bowens, LCSWA Phone Number: 07/24/2023, 1:24 PM   Clinical Narrative:  CSW spoke with daughter. Daughter shared that MD recommended hospice in hospital or hospice house. Daughter has no preference . CSW did reach out to [facility] to see about open beds. TOC will continue to follow.   CSW spoke with [place] at [place] . Lelon Mast shared that she received referral and will work on it next. Also will provide an update on when nurse can come out to assess.   Samantha with [facility] called CSW back and shared that the hospice nurse will be out tomorrow between 9:30 am-10:00 am and that the [facility] did not have any open beds. Also shared that the daughter has been notified and is agreeable to hospice doing the virtual bed here at the hospital.    ED Mini Assessment: What brought you to the Emergency Department? : flu like symptoms and lethargy  Barriers to Discharge:  (Needs Hospice)  Barrier interventions: Refer out to hospice  Means of departure: Ambulance  Interventions which prevented an admission or readmission: Hospice    Patient Contact and Communications Key Contact 1: [place] Smothers "Ginger"   Spoke with: Daughter Contact Date: 07/24/23,   Contact time: 1323 Contact Phone Number: [phone] Call outcome: Hospice preference  Patient states their goals for this hospitalization and ongoing recovery are:: Open bed at a hospice house CMS Medicare.gov Compare Post Acute Care list provided to:: Patient Represenative (must comment) (Daughter- Ginger) Choice offered to / list presented to : Adult Children  Admission diagnosis:  Flu symptoms Patient Active Problem List   Diagnosis Date Noted   Closed fracture of right wrist 10/01/18 12/05/2018   S/P ORIF (open reduction  internal fixation) fracture    Closed fracture of subtrochanteric section of femur, right, initial encounter (HCC) 09/13/2016   History of esophageal cancer 09/13/2016   Dementia (HCC) 09/13/2016   Carcinoma of middle third of esophagus (HCC) 05/24/2012   Dysphagia    Esophageal cancer (HCC) 05/15/2012   PCP:  Charlynne Pander, MD Pharmacy:   [facility] [place] - [place], [place] - [address] [place] [address] Phone: [phone] Fax: [phone]

## 2023-07-24 NOTE — ED Triage Notes (Signed)
Pt bib [facility] from [facility] for flu like symptoms and lethargy. Pt arrived with sats ra 79, eyes closed and responds to pain. Pt contracted at baseline and emaciated. Pt has DNR MOST form. Pt placed on nonrebreather upon arrival. Edp aware

## 2023-07-24 NOTE — ED Notes (Signed)
Dr. Thomes Dinning notified of the patients TOD on 07/24/23 at 2256. Dr. Thomes Dinning is night-time floor coverage for the patient at this time. TOD Verified by this RN and Richmond Campbell, RN. Verified with daughter (Ginger) the patients funeral home ([facility] - [place], [place]).

## 2023-07-24 NOTE — ED Provider Notes (Signed)
[place] EM[facility]GENC[facility] DEPARTMENT AT [facility] Provider Note   CSN: [id number] Arrival date & time: 07/24/23  1106     History  Chief Complaint  Patient presents with   Influenza   Fatigue    Jamie [name] is a 89 y.o. female.  HPI    89 year old patient comes in with chief complaint of fatigue and altered mental status.  Patient resides at a nursing home.  She has past medical history of dementia and remote history of esophageal cancer with dysphagia.  I spoke with patient's daughter.  She indicates that she saw the patient sometime last week.  Patient was incredibly sleepy and tired.  She was having difficulty with p.o. intake.  On Sunday, she received a phone call from nursing home indicating that patient had flu.  Today they informed her that she was unresponsive, therefore she was sent to the ER.  Patient has a MOST form at the bedside indicating that patient would want limited resuscitation only that would include antibiotics and IV fluids.   Home Medications Prior to Admission medications   Medication Sig Start Date End Date Taking? Authorizing Provider  acetaminophen (TYLENOL) 325 MG tablet Take 2 tablets (650 mg total) by mouth every 6 (six) hours as needed. 09/14/16   Teryl Lucy, MD  Calcium Citrate (CITRACAL PO) Take 1 tablet by mouth daily. Patient not taking: No sig reported    [provider]  cephALEXin (KEFLEX) 500 MG capsule Take 1 capsule (500 mg total) by mouth 4 (four) times daily. Patient not taking: No sig reported 02/11/21   Triplett, Tammy, PA-C  Cholecalciferol (VITAMIN D-3) 1000 UNITS CAPS Take 1,000 Units by mouth daily.  Patient not taking: Reported on 04/09/2021    [provider]  citalopram (CELEXA) 10 MG tablet  10/07/18   [provider]  citalopram (CELEXA) 20 MG tablet Take 20 mg by mouth daily. Patient not taking: Reported on 04/09/2021 01/12/21   [provider]  clonazePAM (KLONOPIN) 0.5 MG  tablet Take 1 tablet (0.5 mg total) by mouth 2 (two) times daily as needed for anxiety (anxiety/agitation). Patient not taking: Reported on 04/09/2021 09/18/16   Zannie Cove, MD  divalproex (DEPAKOTE SPRINKLE) 125 MG capsule Take 250 mg by mouth 2 (two) times daily. 02/07/21   [provider]  divalproex (DEPAKOTE SPRINKLE) 125 MG capsule  02/01/20   [provider]  divalproex (DEPAKOTE) 250 MG DR tablet  10/16/18   [provider]  enoxaparin (LOVENOX) 30 MG/0.3ML injection Inject 0.3 mLs (30 mg total) into the skin daily. Patient not taking: No sig reported 09/15/16   Teryl Lucy, MD  esomeprazole (NEXIUM) 40 MG capsule  10/06/18   [provider]  FLUoxetine (PROZAC) 10 MG capsule  03/01/20   [provider]  memantine (NAMENDA) 10 MG tablet Take 10 mg by mouth 2 (two) times daily. 01/24/21   [provider]  memantine (NAMENDA) 5 MG tablet  03/01/20   [provider]  Memantine HCl-Donepezil HCl 28-10 MG CP24 Take 1 capsule by mouth daily. Patient not taking: No sig reported    [provider]  metoprolol succinate (TOPROL-XL) 25 MG 24 hr tablet Take 25 mg by mouth daily with lunch.    [provider]  Multiple Vitamin (MULTIVITAMIN) tablet Take 1 tablet by mouth daily.    [provider]  nitroGLYCERIN (NITROSTAT) 0.4 MG SL tablet Place 0.4 mg under the tongue every 5 (five) minutes as needed for chest  pain.    [provider]  OLANZapine (ZYPREXA) 2.5 MG tablet Take 2.5 mg by mouth at bedtime. 01/26/21   [provider]  risperiDONE (RISPERDAL) 0.25 MG tablet Take 0.25 mg by mouth daily as needed (for aggitation).  Patient not taking: No sig reported 07/27/16   [provider]  traMADol (ULTRAM) 50 MG tablet Take 25 mg by mouth every 6 (six) hours as needed for moderate pain. 10/03/18   [provider]      Allergies    Codeine and Lipitor [atorvastatin]    Review of  Systems   Review of Systems  Physical Exam Updated Vital Signs BP 114/60 (BP Location: Left Arm)   Pulse 71   Temp 100 F (37.8 C) (Rectal)   Resp (!) 27   Ht 5\' 3"  (1.6 m)   Wt 46.4 kg   SpO2 100%   BMI 18.12 kg/m  Physical Exam Vitals and nursing note reviewed.  Constitutional:      Comments: Moaning, respiratory distress  HENT:     Nose: Congestion present.  Eyes:     Comments: 3 mm and equal  Cardiovascular:     Rate and Rhythm: Tachycardia present.  Pulmonary:     Effort: Respiratory distress present.     Breath sounds: Rhonchi present.  Neurological:     GCS: GCS eye subscore is 1. GCS verbal subscore is 3. GCS motor subscore is 4.     ED Results / Procedures / Treatments   Labs (all labs ordered are listed, but only abnormal results are displayed) Labs Reviewed  COMPREHENSIVE METABOLIC PANEL - Abnormal; Notable for the following components:      Result Value   Sodium 165 (*)    Chloride 121 (*)    CO2 21 (*)    Glucose, Bld 186 (*)    BUN 133 (*)    Creatinine, Ser 3.89 (*)    Total Protein 8.3 (*)    Albumin 2.8 (*)    GFR, Estimated 10 (*)    Anion gap 23 (*)    All other components within normal limits  LACTIC ACID, PLASMA - Abnormal; Notable for the following components:   Lactic Acid, Venous 2.6 (*)    All other components within normal limits  CBC WITH DIFFERENTIAL/PLATELET - Abnormal; Notable for the following components:   WBC 26.8 (*)    Hemoglobin 15.2 (*)    HCT 49.5 (*)    MCV 101.9 (*)    Neutro Abs 24.6 (*)    Abs Immature Granulocytes 0.20 (*)    All other components within normal limits  PROTIME-INR - Abnormal; Notable for the following components:   Prothrombin Time 18.6 (*)    INR 1.5 (*)    All other components within normal limits  LACTIC ACID, PLASMA - Abnormal; Notable for the following components:   Lactic Acid, Venous 3.1 (*)    All other components within normal limits  LACTIC ACID, PLASMA - Abnormal; Notable for the  following components:   Lactic Acid, Venous 2.5 (*)    All other components within normal limits  CULTURE, BLOOD (ROUTINE X 2)  CULTURE, BLOOD (ROUTINE X 2)  RESP PANEL BY RT-PCR (RSV, FLU A&B, COVID)  RVPGX2  APTT  URINALYSIS, W/ REFLEX TO CULTURE (INFECTION SUSPECTED)    EKG EKG Interpretation Date/Time:  Tuesday July 24 2023 11:17:18 EST Ventricular Rate:  144 PR Interval:  157 QRS Duration:  71 QT Interval:  300 QTC Calculation: 381 R  Axis:   27  Text Interpretation: Supraventricular tachycardia Ventricular bigeminy Abnormal R-wave progression, early transition Probable LVH with secondary repol abnrm ST depression, probably rate related Confirmed by Derwood Kaplan [phone]) on 07/24/2023 4:17:26 PM  Radiology DG Chest Portable 1 View Result Date: 07/24/2023 CLINICAL DATA:  Sepsis.  Fatigue. EXAM: PORTABLE CHEST 1 VIEW COMPARISON:  04/09/2021. FINDINGS: There are increased interstitial markings, which is nonspecific but appear grossly similar to the prior study. No frank pulmonary edema. No dense consolidation or lung collapse. No large pleural effusion or pneumothorax seen within the limitations of this exam. Stable cardio-mediastinal silhouette. No acute osseous abnormalities. The soft tissues are within normal limits. IMPRESSION: Increased interstitial markings, nonspecific. No acute cardiopulmonary abnormality. Electronically Signed   By: Jules Schick M.D.   On: 07/24/2023 13:55    Procedures .Critical Care  Performed by: Derwood Kaplan, MD Authorized by: Derwood Kaplan, MD   Critical care provider statement:    Critical care time (minutes):  78   Critical care was necessary to treat or prevent imminent or life-threatening deterioration of the following conditions:  Respiratory failure, renal failure, CNS failure or compromise and metabolic crisis   Critical care was time spent personally by me on the following activities:  Development of treatment plan with patient or  surrogate, discussions with consultants, evaluation of patient's response to treatment, examination of patient, ordering and review of laboratory studies, ordering and review of radiographic studies, ordering and performing treatments and interventions, pulse oximetry, re-evaluation of patient's condition and review of old charts     Medications Ordered in ED Medications  glycopyrrolate (ROBINUL) tablet 1 mg (has no administration in time range)    Or  glycopyrrolate (ROBINUL) injection 0.2 mg (has no administration in time range)    Or  glycopyrrolate (ROBINUL) injection 0.2 mg (has no administration in time range)  polyvinyl alcohol (LIQUIFILM TEARS) 1.4 % ophthalmic solution 1 drop (has no administration in time range)  morphine (PF) 2 MG/ML injection 2-4 mg (has no administration in time range)  LORazepam (ATIVAN) injection 2-4 mg (has no administration in time range)    ED Course/ Medical Decision Making/ A&P Clinical Course as of 07/24/23 1624  Tue Jul 24, 2023  1242 I had a long conversation on goals of care with patient's daughter, Ms. Beaupre Smothers.  I discussed with her that her mother is critically ill, with respiratory failure, renal failure and sodium of 165.  She likely is also septic with underlying influenza.  I discussed with her patient's MOST form request and also shared with her extremely poor prognosis currently given the initial assessment.  Ms.Smothers had seen the patient on Friday.  She states that she had become more sleepy, and was having difficulty with p.o. intake.  Over the weekend the nursing home informed her that patient tested positive for flu.  She was concerned that her mother might start declining.  Ms. Glenetta Borg is comfortable with [place] proceeding with comfort care, infection request that we focus on just comfort at this time.  We will shift the focus to comfort care at this time.  Comfort care order stat will be utilized, nursing staff has been  made aware.  I have requested RT to place patient on high flow oxygen.  I have consulted TOC to see if they can place the patient to hospice otherwise we will have to admit her for hospice care. [AN]    Clinical Course User Index [AN] Derwood Kaplan, MD  Medical Decision Making Amount and/or Complexity of Data Reviewed Labs: ordered. Radiology: ordered.  Risk OTC drugs. Prescription drug management. Decision regarding hospitalization.   89 year old patient with history of dementia comes in with chief complaint of worsening altered mental status.  Patient resides at [facility].  Collateral history provided by nursing home.  Additional collateral history provided by patient's daughter, who also has medical POA.  Patient arrives to the ER tachycardic, tachypneic, in respiratory distress and with fevers.  She has known flu.  Differential diagnosis for her includes respiratory failure, ARDS, superimposed pneumonia, electrolyte abnormality, renal failure, severe dehydration.  Initial workup reveals that patient has profound hypernatremia with sodium of 165.  She has new renal failure with profound uremia, with BUN over 130 and creatinine of close to 4.  I discussed this finding with the patient's daughter.  I informed her that patient has extremely poor prognosis at this time, and at high risk for passing away.  I started discussing with the daughter patient's most care and goals of care, at which time the daughter told me that she would want to focus on comfort measures.  I informed her that the comfort measures would mean that we would not give any resuscitation including fluids, and that patient will be allowed to pass away in comfort.  She understands.  She will be calling her sister who lives in [place].  I have notified TOC team.  There are no available beds.  They have notified this finding to the family as well.  We will admit  patient for comfort care only.  She has comfort care orders on and I have added high flow nasal cannula.   Final Clinical Impression(s) / ED Diagnoses Final diagnoses:  Acute hypoxic respiratory failure (HCC)  AKI (acute kidney injury) (HCC)  Uremia  Acute hypernatremia    Rx / DC Orders ED Discharge Orders     None         Derwood Kaplan, MD 07/24/23 1624

## 2023-07-27 LAB — URINE CULTURE: Culture: >=100000 — AB

## 2023-07-28 ENCOUNTER — Telehealth (HOSPITAL_BASED_OUTPATIENT_CLINIC_OR_DEPARTMENT_OTHER): Payer: Self-pay | Admitting: *Deleted

## 2023-07-29 LAB — CULTURE, BLOOD (ROUTINE X 2)
Culture: NO GROWTH
Culture: NO GROWTH
Special Requests: ADEQUATE

## 2023-08-11 DEATH — deceased
